# Patient Record
Sex: Male | Born: 1955 | Race: White | Hispanic: No | Marital: Married | Smoking: Former smoker
Health system: Southern US, Community
[De-identification: ages and names within clinical notes are randomized; demographics above are authoritative.]

## PROBLEM LIST (undated history)

## (undated) DIAGNOSIS — E119 Type 2 diabetes mellitus without complications: Secondary | ICD-10-CM

## (undated) DIAGNOSIS — I10 Essential (primary) hypertension: Secondary | ICD-10-CM

---

## 2019-12-02 LAB — COLOGUARD: COLOGUARD: NEGATIVE

## 2020-02-16 ENCOUNTER — Other Ambulatory Visit (HOSPITAL_COMMUNITY): Payer: Self-pay

## 2020-02-16 ENCOUNTER — Emergency Department (HOSPITAL_COMMUNITY): Payer: Medicare Other

## 2020-02-16 ENCOUNTER — Encounter (HOSPITAL_COMMUNITY)
Admission: EM | Disposition: A | Discharge status: Rehabilitation Facility | Payer: Self-pay | Source: Home / Self Care | Attending: Orthopedic Surgery

## 2020-02-16 ENCOUNTER — Encounter (HOSPITAL_COMMUNITY): Payer: Self-pay | Admitting: Anesthesiology

## 2020-02-16 ENCOUNTER — Other Ambulatory Visit: Payer: Self-pay

## 2020-02-16 ENCOUNTER — Inpatient Hospital Stay (HOSPITAL_COMMUNITY)
Admission: EM | Admit: 2020-02-16 | Discharge: 2020-02-26 | DRG: 958 | Disposition: A | Discharge status: Rehabilitation Facility | Payer: Medicare Other | Attending: Orthopedic Surgery | Admitting: Orthopedic Surgery

## 2020-02-16 ENCOUNTER — Inpatient Hospital Stay (HOSPITAL_COMMUNITY): Payer: Medicare Other | Admitting: Anesthesiology

## 2020-02-16 ENCOUNTER — Inpatient Hospital Stay (HOSPITAL_COMMUNITY): Payer: Medicare Other

## 2020-02-16 ENCOUNTER — Encounter (HOSPITAL_COMMUNITY): Payer: Self-pay | Admitting: Pharmacy Technician

## 2020-02-16 DIAGNOSIS — S270XXD Traumatic pneumothorax, subsequent encounter: Secondary | ICD-10-CM | POA: Diagnosis not present

## 2020-02-16 DIAGNOSIS — M79605 Pain in left leg: Secondary | ICD-10-CM | POA: Diagnosis not present

## 2020-02-16 DIAGNOSIS — E785 Hyperlipidemia, unspecified: Secondary | ICD-10-CM | POA: Diagnosis present

## 2020-02-16 DIAGNOSIS — D62 Acute posthemorrhagic anemia: Secondary | ICD-10-CM | POA: Diagnosis not present

## 2020-02-16 DIAGNOSIS — S82201B Unspecified fracture of shaft of right tibia, initial encounter for open fracture type I or II: Secondary | ICD-10-CM | POA: Diagnosis present

## 2020-02-16 DIAGNOSIS — Z79899 Other long term (current) drug therapy: Secondary | ICD-10-CM

## 2020-02-16 DIAGNOSIS — Z6836 Body mass index (BMI) 36.0-36.9, adult: Secondary | ICD-10-CM | POA: Diagnosis not present

## 2020-02-16 DIAGNOSIS — Z87891 Personal history of nicotine dependence: Secondary | ICD-10-CM

## 2020-02-16 DIAGNOSIS — Z9889 Other specified postprocedural states: Secondary | ICD-10-CM

## 2020-02-16 DIAGNOSIS — Z7982 Long term (current) use of aspirin: Secondary | ICD-10-CM | POA: Diagnosis not present

## 2020-02-16 DIAGNOSIS — W11XXXA Fall on and from ladder, initial encounter: Secondary | ICD-10-CM | POA: Diagnosis present

## 2020-02-16 DIAGNOSIS — N179 Acute kidney failure, unspecified: Secondary | ICD-10-CM | POA: Diagnosis not present

## 2020-02-16 DIAGNOSIS — K219 Gastro-esophageal reflux disease without esophagitis: Secondary | ICD-10-CM | POA: Diagnosis present

## 2020-02-16 DIAGNOSIS — T1490XA Injury, unspecified, initial encounter: Secondary | ICD-10-CM

## 2020-02-16 DIAGNOSIS — Z7984 Long term (current) use of oral hypoglycemic drugs: Secondary | ICD-10-CM

## 2020-02-16 DIAGNOSIS — S8392XA Sprain of unspecified site of left knee, initial encounter: Secondary | ICD-10-CM | POA: Diagnosis present

## 2020-02-16 DIAGNOSIS — J939 Pneumothorax, unspecified: Secondary | ICD-10-CM

## 2020-02-16 DIAGNOSIS — S82102A Unspecified fracture of upper end of left tibia, initial encounter for closed fracture: Secondary | ICD-10-CM | POA: Diagnosis not present

## 2020-02-16 DIAGNOSIS — S82142S Displaced bicondylar fracture of left tibia, sequela: Secondary | ICD-10-CM | POA: Diagnosis not present

## 2020-02-16 DIAGNOSIS — Z79891 Long term (current) use of opiate analgesic: Secondary | ICD-10-CM

## 2020-02-16 DIAGNOSIS — E872 Acidosis: Secondary | ICD-10-CM | POA: Diagnosis present

## 2020-02-16 DIAGNOSIS — Z791 Long term (current) use of non-steroidal anti-inflammatories (NSAID): Secondary | ICD-10-CM | POA: Diagnosis not present

## 2020-02-16 DIAGNOSIS — E119 Type 2 diabetes mellitus without complications: Secondary | ICD-10-CM | POA: Diagnosis present

## 2020-02-16 DIAGNOSIS — R52 Pain, unspecified: Secondary | ICD-10-CM

## 2020-02-16 DIAGNOSIS — E669 Obesity, unspecified: Secondary | ICD-10-CM | POA: Diagnosis present

## 2020-02-16 DIAGNOSIS — I10 Essential (primary) hypertension: Secondary | ICD-10-CM | POA: Diagnosis present

## 2020-02-16 DIAGNOSIS — T148XXA Other injury of unspecified body region, initial encounter: Secondary | ICD-10-CM

## 2020-02-16 DIAGNOSIS — S270XXA Traumatic pneumothorax, initial encounter: Secondary | ICD-10-CM | POA: Diagnosis present

## 2020-02-16 DIAGNOSIS — Z20822 Contact with and (suspected) exposure to covid-19: Secondary | ICD-10-CM | POA: Diagnosis present

## 2020-02-16 DIAGNOSIS — M25511 Pain in right shoulder: Secondary | ICD-10-CM | POA: Diagnosis present

## 2020-02-16 DIAGNOSIS — W11XXXD Fall on and from ladder, subsequent encounter: Secondary | ICD-10-CM | POA: Diagnosis present

## 2020-02-16 DIAGNOSIS — S82142A Displaced bicondylar fracture of left tibia, initial encounter for closed fracture: Principal | ICD-10-CM | POA: Diagnosis present

## 2020-02-16 DIAGNOSIS — S82142K Displaced bicondylar fracture of left tibia, subsequent encounter for closed fracture with nonunion: Secondary | ICD-10-CM | POA: Diagnosis not present

## 2020-02-16 DIAGNOSIS — S82202A Unspecified fracture of shaft of left tibia, initial encounter for closed fracture: Secondary | ICD-10-CM

## 2020-02-16 DIAGNOSIS — D72829 Elevated white blood cell count, unspecified: Secondary | ICD-10-CM | POA: Diagnosis present

## 2020-02-16 DIAGNOSIS — S2231XD Fracture of one rib, right side, subsequent encounter for fracture with routine healing: Secondary | ICD-10-CM | POA: Diagnosis not present

## 2020-02-16 DIAGNOSIS — S2241XA Multiple fractures of ribs, right side, initial encounter for closed fracture: Secondary | ICD-10-CM

## 2020-02-16 DIAGNOSIS — S2231XA Fracture of one rib, right side, initial encounter for closed fracture: Secondary | ICD-10-CM | POA: Diagnosis present

## 2020-02-16 DIAGNOSIS — E118 Type 2 diabetes mellitus with unspecified complications: Secondary | ICD-10-CM | POA: Diagnosis not present

## 2020-02-16 DIAGNOSIS — M25561 Pain in right knee: Secondary | ICD-10-CM | POA: Diagnosis present

## 2020-02-16 DIAGNOSIS — S82142D Displaced bicondylar fracture of left tibia, subsequent encounter for closed fracture with routine healing: Secondary | ICD-10-CM | POA: Diagnosis not present

## 2020-02-16 DIAGNOSIS — W11XXXS Fall on and from ladder, sequela: Secondary | ICD-10-CM | POA: Diagnosis not present

## 2020-02-16 DIAGNOSIS — Y93H9 Activity, other involving exterior property and land maintenance, building and construction: Secondary | ICD-10-CM

## 2020-02-16 DIAGNOSIS — Z9689 Presence of other specified functional implants: Secondary | ICD-10-CM

## 2020-02-16 DIAGNOSIS — Z419 Encounter for procedure for purposes other than remedying health state, unspecified: Secondary | ICD-10-CM

## 2020-02-16 DIAGNOSIS — J930 Spontaneous tension pneumothorax: Secondary | ICD-10-CM | POA: Diagnosis not present

## 2020-02-16 DIAGNOSIS — S82202D Unspecified fracture of shaft of left tibia, subsequent encounter for closed fracture with routine healing: Secondary | ICD-10-CM | POA: Diagnosis not present

## 2020-02-16 DIAGNOSIS — Z794 Long term (current) use of insulin: Secondary | ICD-10-CM | POA: Diagnosis not present

## 2020-02-16 DIAGNOSIS — S82402D Unspecified fracture of shaft of left fibula, subsequent encounter for closed fracture with routine healing: Secondary | ICD-10-CM | POA: Diagnosis present

## 2020-02-16 HISTORY — DX: Type 2 diabetes mellitus without complications: E11.9

## 2020-02-16 HISTORY — PX: I & D EXTREMITY: SHX5045

## 2020-02-16 HISTORY — PX: ARTHROCENTESIS INJECTION: SHX6830

## 2020-02-16 HISTORY — DX: Essential (primary) hypertension: I10

## 2020-02-16 HISTORY — PX: CLOSED REDUCTION TIBIA: SHX5115

## 2020-02-16 HISTORY — PX: EXTERNAL FIXATION LEG: SHX1549

## 2020-02-16 LAB — COMPREHENSIVE METABOLIC PANEL
ALT: 21 U/L (ref 0–44)
AST: 37 U/L (ref 15–41)
Albumin: 4.2 g/dL (ref 3.5–5.0)
Alkaline Phosphatase: 56 U/L (ref 38–126)
Anion gap: 15 (ref 5–15)
BUN: 18 mg/dL (ref 8–23)
CO2: 18 mmol/L — ABNORMAL LOW (ref 22–32)
Calcium: 9.3 mg/dL (ref 8.9–10.3)
Chloride: 104 mmol/L (ref 98–111)
Creatinine, Ser: 1.07 mg/dL (ref 0.61–1.24)
GFR calc Af Amer: >60 mL/min (ref >60)
GFR calc non Af Amer: >60 mL/min (ref >60)
Glucose, Bld: 155 mg/dL — ABNORMAL HIGH (ref 70–99)
Potassium: 5.1 mmol/L (ref 3.5–5.1)
Sodium: 137 mmol/L (ref 135–145)
Total Bilirubin: 1.2 mg/dL (ref 0.3–1.2)
Total Protein: 6.7 g/dL (ref 6.5–8.1)

## 2020-02-16 LAB — SURGICAL PCR SCREEN
MRSA, PCR: NEGATIVE
Staphylococcus aureus: NEGATIVE

## 2020-02-16 LAB — I-STAT CHEM 8, ED
BUN: 22 mg/dL (ref 8–23)
Calcium, Ion: 1 mmol/L — ABNORMAL LOW (ref 1.15–1.40)
Chloride: 105 mmol/L (ref 98–111)
Creatinine, Ser: 0.9 mg/dL (ref 0.61–1.24)
Glucose, Bld: 152 mg/dL — ABNORMAL HIGH (ref 70–99)
HCT: 42 % (ref 39.0–52.0)
Hemoglobin: 14.3 g/dL (ref 13.0–17.0)
Potassium: 4.8 mmol/L (ref 3.5–5.1)
Sodium: 137 mmol/L (ref 135–145)
TCO2: 24 mmol/L (ref 22–32)

## 2020-02-16 LAB — CBC
HCT: 44.2 % (ref 39.0–52.0)
Hemoglobin: 14.8 g/dL (ref 13.0–17.0)
MCH: 30.3 pg (ref 26.0–34.0)
MCHC: 33.5 g/dL (ref 30.0–36.0)
MCV: 90.4 fL (ref 80.0–100.0)
Platelets: 177 10*3/uL (ref 150–400)
RBC: 4.89 MIL/uL (ref 4.22–5.81)
RDW: 12.8 % (ref 11.5–15.5)
WBC: 10.6 10*3/uL — ABNORMAL HIGH (ref 4.0–10.5)
nRBC: 0 % (ref 0.0–0.2)

## 2020-02-16 LAB — GLUCOSE, CAPILLARY
Glucose-Capillary: 212 mg/dL — ABNORMAL HIGH (ref 70–99)
Glucose-Capillary: 205 mg/dL — ABNORMAL HIGH (ref 70–99)
Glucose-Capillary: 229 mg/dL — ABNORMAL HIGH (ref 70–99)

## 2020-02-16 LAB — HIV ANTIBODY (ROUTINE TESTING W REFLEX): HIV Screen 4th Generation wRfx: NONREACTIVE

## 2020-02-16 LAB — LACTIC ACID, PLASMA
Lactic Acid, Venous: 3.5 mmol/L (ref 0.5–1.9)
Lactic Acid, Venous: 2.3 mmol/L (ref 0.5–1.9)
Lactic Acid, Venous: 2.8 mmol/L (ref 0.5–1.9)

## 2020-02-16 LAB — RESPIRATORY PANEL BY RT PCR (FLU A&B, COVID)
Influenza A by PCR: NEGATIVE
Influenza B by PCR: NEGATIVE
SARS Coronavirus 2 by RT PCR: NEGATIVE

## 2020-02-16 LAB — SAMPLE TO BLOOD BANK

## 2020-02-16 LAB — PROTIME-INR
INR: 1.1 (ref 0.8–1.2)
Prothrombin Time: 13.7 seconds (ref 11.4–15.2)

## 2020-02-16 LAB — ETHANOL: Alcohol, Ethyl (B): <10 mg/dL (ref <10)

## 2020-02-16 SURGERY — IRRIGATION AND DEBRIDEMENT EXTREMITY
Anesthesia: General | Laterality: Left

## 2020-02-16 MED ORDER — ONDANSETRON HCL 4 MG/2ML IJ SOLN
INTRAMUSCULAR | Status: AC
  Filled 2020-02-16: qty 2

## 2020-02-16 MED ORDER — FENTANYL CITRATE (PF) 100 MCG/2ML IJ SOLN: 25.0000 ug | INTRAMUSCULAR | Status: DC | PRN

## 2020-02-16 MED ORDER — PROPOFOL 10 MG/ML IV BOLUS
INTRAVENOUS | Status: AC
  Filled 2020-02-16: qty 20

## 2020-02-16 MED ORDER — OXYCODONE HCL 5 MG PO TABS: 5.0000 mg | ORAL_TABLET | ORAL | Status: DC | PRN

## 2020-02-16 MED ORDER — METHOCARBAMOL 500 MG PO TABS: 500.0000 mg | ORAL_TABLET | Freq: Four times a day (QID) | ORAL | Status: DC | PRN

## 2020-02-16 MED ORDER — ASPIRIN 81 MG PO CHEW
81.0000 mg | CHEWABLE_TABLET | Freq: Every day | ORAL | Status: DC
  Administered 2020-02-17 – 2020-02-26 (×9): 81 mg via ORAL
  Filled 2020-02-16 (×9): qty 1

## 2020-02-16 MED ORDER — ROSUVASTATIN CALCIUM 5 MG PO TABS
10.0000 mg | ORAL_TABLET | Freq: Every day | ORAL | Status: DC
  Administered 2020-02-17 – 2020-02-25 (×9): 10 mg via ORAL
  Filled 2020-02-16 (×9): qty 2

## 2020-02-16 MED ORDER — ENOXAPARIN SODIUM 40 MG/0.4ML ~~LOC~~ SOLN: 40.0000 mg | Freq: Two times a day (BID) | SUBCUTANEOUS | Status: DC

## 2020-02-16 MED ORDER — MIDAZOLAM HCL 2 MG/2ML IJ SOLN
INTRAMUSCULAR | Status: DC | PRN
  Administered 2020-02-16: 2 mg via INTRAVENOUS

## 2020-02-16 MED ORDER — MIDAZOLAM HCL 2 MG/2ML IJ SOLN
INTRAMUSCULAR | Status: AC
  Filled 2020-02-16: qty 2

## 2020-02-16 MED ORDER — METHOCARBAMOL 1000 MG/10ML IJ SOLN
500.0000 mg | Freq: Four times a day (QID) | INTRAVENOUS | Status: DC | PRN
  Filled 2020-02-16: qty 5

## 2020-02-16 MED ORDER — FENTANYL CITRATE (PF) 250 MCG/5ML IJ SOLN
INTRAMUSCULAR | Status: AC
  Filled 2020-02-16: qty 5

## 2020-02-16 MED ORDER — POVIDONE-IODINE 10 % EX SWAB: 2.0000 "application " | Freq: Once | CUTANEOUS | Status: DC

## 2020-02-16 MED ORDER — ACETAMINOPHEN 10 MG/ML IV SOLN: 1000.0000 mg | Freq: Once | INTRAVENOUS | Status: DC | PRN

## 2020-02-16 MED ORDER — DEXTROSE 5 % IV SOLN
3.0000 g | INTRAVENOUS | Status: AC
  Administered 2020-02-16: 3 g via INTRAVENOUS
  Filled 2020-02-16: qty 3000

## 2020-02-16 MED ORDER — LACTATED RINGERS IV SOLN: INTRAVENOUS | Status: DC

## 2020-02-16 MED ORDER — ONDANSETRON HCL 4 MG/2ML IJ SOLN: 4.0000 mg | Freq: Four times a day (QID) | INTRAMUSCULAR | Status: DC | PRN

## 2020-02-16 MED ORDER — ONDANSETRON HCL 4 MG/2ML IJ SOLN
INTRAMUSCULAR | Status: DC | PRN
  Administered 2020-02-16: 4 mg via INTRAVENOUS

## 2020-02-16 MED ORDER — LIDOCAINE HCL (CARDIAC) PF 100 MG/5ML IV SOSY
PREFILLED_SYRINGE | INTRAVENOUS | Status: DC | PRN
  Administered 2020-02-16: 60 mg via INTRATRACHEAL

## 2020-02-16 MED ORDER — PHENYLEPHRINE HCL (PRESSORS) 10 MG/ML IV SOLN
INTRAVENOUS | Status: DC | PRN
  Administered 2020-02-16 (×2): 40 ug via INTRAVENOUS
  Administered 2020-02-16: 80 ug via INTRAVENOUS

## 2020-02-16 MED ORDER — DEXTROSE 5 % IV SOLN
3.0000 g | INTRAVENOUS | Status: DC
  Filled 2020-02-16: qty 3000

## 2020-02-16 MED ORDER — SUCCINYLCHOLINE CHLORIDE 20 MG/ML IJ SOLN
INTRAMUSCULAR | Status: DC | PRN
  Administered 2020-02-16: 120 mg via INTRAVENOUS

## 2020-02-16 MED ORDER — SUGAMMADEX SODIUM 500 MG/5ML IV SOLN
INTRAVENOUS | Status: DC | PRN
  Administered 2020-02-16: 500 mg via INTRAVENOUS

## 2020-02-16 MED ORDER — VASOPRESSIN 20 UNIT/ML IV SOLN
INTRAVENOUS | Status: AC
  Filled 2020-02-16: qty 1

## 2020-02-16 MED ORDER — SUGAMMADEX SODIUM 500 MG/5ML IV SOLN
INTRAVENOUS | Status: AC
  Filled 2020-02-16: qty 5

## 2020-02-16 MED ORDER — HYDROMORPHONE HCL 1 MG/ML IJ SOLN
1.0000 mg | INTRAMUSCULAR | Status: DC | PRN
  Administered 2020-02-16 (×2): 1 mg via INTRAVENOUS
  Filled 2020-02-16 (×2): qty 1

## 2020-02-16 MED ORDER — GABAPENTIN 300 MG PO CAPS
300.0000 mg | ORAL_CAPSULE | Freq: Every day | ORAL | Status: DC
  Administered 2020-02-17 – 2020-02-25 (×9): 300 mg via ORAL
  Filled 2020-02-16 (×9): qty 1

## 2020-02-16 MED ORDER — INSULIN ASPART 100 UNIT/ML ~~LOC~~ SOLN
0.0000 [IU] | Freq: Three times a day (TID) | SUBCUTANEOUS | Status: DC
  Administered 2020-02-16: 3 [IU] via SUBCUTANEOUS
  Administered 2020-02-17 (×3): 2 [IU] via SUBCUTANEOUS
  Administered 2020-02-18: 3 [IU] via SUBCUTANEOUS
  Administered 2020-02-18 – 2020-02-20 (×6): 2 [IU] via SUBCUTANEOUS
  Administered 2020-02-20: 3 [IU] via SUBCUTANEOUS
  Administered 2020-02-20: 2 [IU] via SUBCUTANEOUS
  Administered 2020-02-21: 3 [IU] via SUBCUTANEOUS
  Administered 2020-02-21 (×2): 2 [IU] via SUBCUTANEOUS
  Administered 2020-02-22: 3 [IU] via SUBCUTANEOUS
  Administered 2020-02-22 – 2020-02-23 (×3): 2 [IU] via SUBCUTANEOUS
  Administered 2020-02-24: 3 [IU] via SUBCUTANEOUS
  Administered 2020-02-24: 2 [IU] via SUBCUTANEOUS

## 2020-02-16 MED ORDER — FENTANYL CITRATE (PF) 250 MCG/5ML IJ SOLN
INTRAMUSCULAR | Status: DC | PRN
  Administered 2020-02-16 (×7): 50 ug via INTRAVENOUS

## 2020-02-16 MED ORDER — ROCURONIUM 10MG/ML (10ML) SYRINGE FOR MEDFUSION PUMP - OPTIME
INTRAVENOUS | Status: DC | PRN
  Administered 2020-02-16: 50 mg via INTRAVENOUS
  Administered 2020-02-16: 20 mg via INTRAVENOUS

## 2020-02-16 MED ORDER — ONDANSETRON HCL 4 MG PO TABS: 4.0000 mg | ORAL_TABLET | Freq: Four times a day (QID) | ORAL | Status: DC | PRN

## 2020-02-16 MED ORDER — MORPHINE SULFATE (PF) 2 MG/ML IV SOLN: 2.0000 mg | INTRAVENOUS | Status: DC | PRN

## 2020-02-16 MED ORDER — ONDANSETRON HCL 4 MG/2ML IJ SOLN
4.0000 mg | Freq: Once | INTRAMUSCULAR | Status: AC | PRN
  Administered 2020-02-16: 4 mg via INTRAVENOUS

## 2020-02-16 MED ORDER — SODIUM CHLORIDE 0.9 % IV SOLN: Freq: Once | INTRAVENOUS | Status: AC

## 2020-02-16 MED ORDER — FENTANYL CITRATE (PF) 100 MCG/2ML IJ SOLN
100.0000 ug | Freq: Once | INTRAMUSCULAR | Status: AC
  Administered 2020-02-16: 100 ug via INTRAVENOUS

## 2020-02-16 MED ORDER — CHLORHEXIDINE GLUCONATE 4 % EX LIQD
60.0000 mL | Freq: Once | CUTANEOUS | Status: AC
  Administered 2020-02-16: 4 via TOPICAL

## 2020-02-16 MED ORDER — VASOPRESSIN 20 UNIT/ML IV SOLN
INTRAVENOUS | Status: DC | PRN
  Administered 2020-02-16: 1 [IU] via INTRAVENOUS
  Administered 2020-02-16: 3 [IU] via INTRAVENOUS
  Administered 2020-02-16 (×3): 2 [IU] via INTRAVENOUS

## 2020-02-16 MED ORDER — LISINOPRIL 20 MG PO TABS: 20.0000 mg | ORAL_TABLET | Freq: Every day | ORAL | Status: DC

## 2020-02-16 MED ORDER — KETOROLAC TROMETHAMINE 15 MG/ML IJ SOLN: 15.0000 mg | Freq: Once | INTRAMUSCULAR | Status: AC | PRN

## 2020-02-16 MED ORDER — 0.9 % SODIUM CHLORIDE (POUR BTL) OPTIME
TOPICAL | Status: DC | PRN
  Administered 2020-02-16: 1000 mL

## 2020-02-16 MED ORDER — PANTOPRAZOLE SODIUM 20 MG PO TBEC
20.0000 mg | DELAYED_RELEASE_TABLET | Freq: Every day | ORAL | Status: DC
  Administered 2020-02-17 – 2020-02-26 (×11): 20 mg via ORAL
  Filled 2020-02-16 (×10): qty 1

## 2020-02-16 MED ORDER — PROPOFOL 10 MG/ML IV BOLUS
INTRAVENOUS | Status: DC | PRN
  Administered 2020-02-16: 150 mg via INTRAVENOUS

## 2020-02-16 SURGICAL SUPPLY — 69 items
BANDAGE ESMARK 6X9 LF (GAUZE/BANDAGES/DRESSINGS) ×2 IMPLANT
BAR GLASS FIBER EXFX 11X600 (EXFIX) ×2 IMPLANT
BNDG COHESIVE 4X5 TAN STRL (GAUZE/BANDAGES/DRESSINGS) ×3 IMPLANT
BNDG COHESIVE 6X5 TAN STRL LF (GAUZE/BANDAGES/DRESSINGS) IMPLANT
BNDG ELASTIC 4X5.8 VLCR STR LF (GAUZE/BANDAGES/DRESSINGS) ×4 IMPLANT
BNDG ELASTIC 6X15 VLCR STRL LF (GAUZE/BANDAGES/DRESSINGS) ×2 IMPLANT
BNDG ELASTIC 6X5.8 VLCR STR LF (GAUZE/BANDAGES/DRESSINGS) ×3 IMPLANT
BNDG ESMARK 6X9 LF (GAUZE/BANDAGES/DRESSINGS) ×3
BNDG GAUZE ELAST 4 BULKY (GAUZE/BANDAGES/DRESSINGS) ×6 IMPLANT
BRUSH SCRUB EZ PLAIN DRY (MISCELLANEOUS) ×6 IMPLANT
CANISTER WOUNDNEG PRESSURE 500 (CANNISTER) ×2 IMPLANT
COVER SURGICAL LIGHT HANDLE (MISCELLANEOUS) ×6 IMPLANT
COVER WAND RF STERILE (DRAPES) ×2 IMPLANT
CUFF TOURN SGL QUICK 18X4 (TOURNIQUET CUFF) IMPLANT
DRAPE C-ARM 42X72 X-RAY (DRAPES) IMPLANT
DRAPE C-ARMOR (DRAPES) ×3 IMPLANT
DRAPE U-SHAPE 47X51 STRL (DRAPES) ×3 IMPLANT
DRESSING PREVENA PLUS CUSTOM (GAUZE/BANDAGES/DRESSINGS) IMPLANT
DRSG ADAPTIC 3X8 NADH LF (GAUZE/BANDAGES/DRESSINGS) ×3 IMPLANT
DRSG MEPITEL 4X7.2 (GAUZE/BANDAGES/DRESSINGS) ×1 IMPLANT
DRSG PREVENA PLUS CUSTOM (GAUZE/BANDAGES/DRESSINGS) ×3
ELECT REM PT RETURN 9FT ADLT (ELECTROSURGICAL) ×3
ELECTRODE REM PT RTRN 9FT ADLT (ELECTROSURGICAL) ×2 IMPLANT
EVACUATOR 1/8 PVC DRAIN (DRAIN) IMPLANT
GAUZE SPONGE 4X4 12PLY STRL (GAUZE/BANDAGES/DRESSINGS) ×3 IMPLANT
GAUZE SPONGE 4X4 12PLY STRL LF (GAUZE/BANDAGES/DRESSINGS) ×2 IMPLANT
GLOVE BIO SURGEON STRL SZ7.5 (GLOVE) ×3 IMPLANT
GLOVE BIO SURGEON STRL SZ8 (GLOVE) ×3 IMPLANT
GLOVE BIOGEL PI IND STRL 7.5 (GLOVE) ×2 IMPLANT
GLOVE BIOGEL PI IND STRL 8 (GLOVE) ×2 IMPLANT
GLOVE BIOGEL PI IND STRL 9 (GLOVE) ×2 IMPLANT
GLOVE BIOGEL PI INDICATOR 7.5 (GLOVE) ×1
GLOVE BIOGEL PI INDICATOR 8 (GLOVE) ×1
GLOVE BIOGEL PI INDICATOR 9 (GLOVE) ×1
GOWN STRL REUS W/ TWL LRG LVL3 (GOWN DISPOSABLE) ×4 IMPLANT
GOWN STRL REUS W/ TWL XL LVL3 (GOWN DISPOSABLE) ×2 IMPLANT
GOWN STRL REUS W/TWL LRG LVL3 (GOWN DISPOSABLE) ×2
GOWN STRL REUS W/TWL XL LVL3 (GOWN DISPOSABLE) ×1
HALF PIN 5.0X160 (EXFIX) ×4 IMPLANT
HANDPIECE INTERPULSE COAX TIP (DISPOSABLE) ×1
KIT BASIN OR (CUSTOM PROCEDURE TRAY) ×3 IMPLANT
KIT TURNOVER KIT B (KITS) ×3 IMPLANT
MANIFOLD NEPTUNE II (INSTRUMENTS) ×3 IMPLANT
NEEDLE 22X1 1/2 (OR ONLY) (NEEDLE) IMPLANT
NS IRRIG 1000ML POUR BTL (IV SOLUTION) ×3 IMPLANT
PACK ORTHO EXTREMITY (CUSTOM PROCEDURE TRAY) ×3 IMPLANT
PAD ARMBOARD 7.5X6 YLW CONV (MISCELLANEOUS) ×6 IMPLANT
PAD CAST 4YDX4 CTTN HI CHSV (CAST SUPPLIES) IMPLANT
PAD NEG PRESSURE SENSATRAC (MISCELLANEOUS) ×1 IMPLANT
PADDING CAST COTTON 4X4 STRL (CAST SUPPLIES) ×2
PADDING CAST COTTON 6X4 STRL (CAST SUPPLIES) ×6 IMPLANT
PIN CLAMP 2BAR 75MM BLUE (EXFIX) ×2 IMPLANT
SET HNDPC FAN SPRY TIP SCT (DISPOSABLE) IMPLANT
SOL PREP POV-IOD 4OZ 10% (MISCELLANEOUS) ×4 IMPLANT
SOL PREP PROV IODINE SCRUB 4OZ (MISCELLANEOUS) ×3 IMPLANT
SPONGE LAP 18X18 RF (DISPOSABLE) ×3 IMPLANT
STAPLER VISISTAT 35W (STAPLE) IMPLANT
STOCKINETTE IMPERVIOUS 9X36 MD (GAUZE/BANDAGES/DRESSINGS) IMPLANT
STOCKINETTE IMPERVIOUS LG (DRAPES) IMPLANT
STRIP CLOSURE SKIN 1/2X4 (GAUZE/BANDAGES/DRESSINGS) IMPLANT
SUT ETHILON 2 0 FS 18 (SUTURE) ×3 IMPLANT
SUT PDS AB 2-0 CT1 27 (SUTURE) IMPLANT
SUT PDS AB 2-0 CT2 27 (SUTURE) ×2 IMPLANT
TOWEL GREEN STERILE (TOWEL DISPOSABLE) ×6 IMPLANT
TOWEL GREEN STERILE FF (TOWEL DISPOSABLE) ×6 IMPLANT
TUBE CONNECTING 12X1/4 (SUCTIONS) ×3 IMPLANT
UNDERPAD 30X30 (UNDERPADS AND DIAPERS) ×3 IMPLANT
WATER STERILE IRR 1000ML POUR (IV SOLUTION) ×3 IMPLANT
YANKAUER SUCT BULB TIP NO VENT (SUCTIONS) ×3 IMPLANT

## 2020-02-16 NOTE — Consult Note (Signed)
Reason for Consult:Leg deformity Referring Physician: R Ether Wolters is an 64 y.o. male.  HPI: Clair Gulling was working on a ladder and fell about 72ft. He his an [facility] unit on the way down. He had immediate lower extremity pain upon landing. He was brought in as a level 2 trauma activation. He c/o right shoulder and bilateral leg pain.  Past Medical History:  Diagnosis Date  . Diabetes mellitus without complication ([place])   . Hypertension     History reviewed. No pertinent surgical history.  No family history on file.  Social History:  has no history on file for tobacco, alcohol, and drug.  Allergies: No Known Allergies  Medications: I have reviewed the patient's current medications.  Results for orders placed or performed during the hospital encounter of 02/16/20 (from the past 48 hour(s))  I-Stat Chem 8, ED     Status: Abnormal   Collection Time: 02/16/20 10:38 AM  Result Value Ref Range   Sodium 137 135 - 145 mmol/L   Potassium 4.8 3.5 - 5.1 mmol/L   Chloride 105 98 - 111 mmol/L   BUN 22 8 - 23 mg/dL   Creatinine, Ser 0.90 0.61 - 1.24 mg/dL   Glucose, Bld 152 (H) 70 - 99 mg/dL    Comment: Glucose reference range applies only to samples taken after fasting for at least 8 hours.   Calcium, Ion 1.00 (L) 1.15 - 1.40 mmol/L   TCO2 24 22 - 32 mmol/L   Hemoglobin 14.3 13.0 - 17.0 g/dL   HCT 42.0 39.0 - 52.0 %    No results found.  Review of Systems  HENT: Negative for ear discharge, ear pain, hearing loss and tinnitus.   Eyes: Negative for photophobia and pain.  Respiratory: Negative for cough and shortness of breath.   Cardiovascular: Positive for chest pain (Right upper).  Gastrointestinal: Negative for abdominal pain, nausea and vomiting.  Genitourinary: Negative for dysuria, flank pain, frequency and urgency.  Musculoskeletal: Positive for arthralgias (Left knee). Negative for back pain, myalgias and neck pain.  Neurological: Negative for dizziness and headaches.   Hematological: Does not bruise/bleed easily.  Psychiatric/Behavioral: The patient is not nervous/anxious.    Blood pressure (!) 150/100, temperature (!) 97.4 F (36.3 C), temperature source Temporal, resp. rate 18, height 5\' 11"  (1.803 m), weight 120.2 kg, SpO2 97 %. Physical Exam  Constitutional: He appears well-developed and well-nourished. No distress.  HENT:  Head: Normocephalic and atraumatic.  Eyes: Conjunctivae are normal. Right eye exhibits no discharge. Left eye exhibits no discharge. No scleral icterus.  Cardiovascular: Normal rate and regular rhythm.  Respiratory: Effort normal. No respiratory distress.  Musculoskeletal:     Cervical back: Normal range of motion.     Comments: RLE Anterior shin lac, no ecchymosis or rash  Mild TTP lower leg  No knee or ankle effusion  Knee stable to varus/ valgus and anterior/posterior stress  Sens DPN, SPN, TN intact  Motor EHL, ext, flex, evers 5/5  DP 2+, No significant edema  LLE Anterior shin lac, no ecchymosis or rash  Mod knee TTP, large hematoma medial knee  No ankle effusion  Sens DPN, SPN, TN intact  Motor EHL, ext, flex, evers 5/5  DP 2+, No significant edema  Neurological: He is alert.  Skin: Skin is warm and dry. He is not diaphoretic.  Psychiatric: He has a normal mood and affect. His behavior is normal.    Assessment/Plan: Left tibia fx -- Will plan on ORIF in AM  with Dr. Carola Frost. Please keep NPO after MN. Multiple medical problems including DM, HTN, GERD, and HLD -- Pt has been very hypertensive here but may be stress/pain. Have asked medicine to admit or at least consult. Appreciate their help.    Freeman Caldron, PA-C Orthopedic Surgery [phone] 02/16/2020, 10:48 AM

## 2020-02-16 NOTE — Anesthesia Procedure Notes (Signed)
Procedure Name: Intubation Date/Time: 02/16/2020 9:07 PM Performed by: Molli Hazard, CRNA Pre-anesthesia Checklist: Patient identified, Emergency Drugs available, Suction available and Patient being monitored Patient Re-evaluated:Patient Re-evaluated prior to induction Oxygen Delivery Method: Circle system utilized Preoxygenation: Pre-oxygenation with 100% oxygen Induction Type: IV induction and Rapid sequence Laryngoscope Size: Miller and 2 Grade View: Grade I Tube type: Oral Tube size: 7.5 mm Number of attempts: 1 Airway Equipment and Method: Stylet Placement Confirmation: ETT inserted through vocal cords under direct vision,  positive ETCO2 and breath sounds checked- equal and bilateral Secured at: 23 cm Tube secured with: Tape Dental Injury: Teeth and Oropharynx as per pre-operative assessment

## 2020-02-16 NOTE — Progress Notes (Signed)
Orthopedic Tech Progress Note Patient Details:  Cameron Velasquez [birth year] [id number] Had help from RN to hold leg while I applied bucky dressing and KNEE IMMOBILIZER  Ortho Devices Type of Ortho Device: Ace wrap, Knee Immobilizer Ortho Device/Splint Location: LLE Ortho Device/Splint Interventions: Ordered, Application   Post Interventions Patient Tolerated: Well Instructions Provided: Care of device   [name] 02/16/2020, 3:36 PM

## 2020-02-16 NOTE — Transfer of Care (Signed)
Immediate Anesthesia Transfer of Care Note  Patient: Wilmon Pali  Procedure(s) Performed: IRRIGATION AND DEBRIDEMENT OPEN FRACTURE EXTREMITY (Bilateral ) EXTERNAL FIXATION LEG (Left ) Arthrocentesis Injection (Left ) Closed Reduction Tibia (Left )  Patient Location: PACU  Anesthesia Type:General  Level of Consciousness: sedated  Airway & Oxygen Therapy: Patient connected to face mask oxygen  Post-op Assessment: Report given to RN and Post -op Vital signs reviewed and stable  Post vital signs: Reviewed and stable  Last Vitals:  Vitals Value Taken Time  BP 168/82 02/16/20 2315  Temp    Pulse 95 02/16/20 2315  Resp 31 02/16/20 2315  SpO2 94 % 02/16/20 2315  Vitals shown include unvalidated device data.  Last Pain:  Vitals:   02/16/20 1548  TempSrc:   PainSc: 8       Patients Stated Pain Goal: 2 (02/16/20 1548)  Complications: No apparent anesthesia complications

## 2020-02-16 NOTE — Consult Note (Signed)
[facility] Medical Consultation  Cameron Velasquez FEO:[id number] DOB: 03-Mar-1956 DOA: 02/16/2020 PCP: No primary care provider on file.   Requesting physician: Dale Peru, PA-C Date of consultation: 02/16/2020 Reason for consultation: Medical management  Impression/Recommendations Active Problems:   Left tibial fracture   Leukocytosis   Essential hypertension   Diabetes mellitus type 2, controlled (HCC)  1.  Left tibial fracture secondary to fall from ladder: Acute. -Per orthopedics   2.  Leukocytosis, lactic acidosis: Acute.  WBC elevated at 10.6 with lactic acid 3.4. -Recheck lactic acid level  3.  Diabetes mellitus type 2: On admission patient blood glucose 155.  Last hemoglobin A1c noted to be 5.5 on 12/24/2019 patient's home medications include Trulicity 1.5 mg weekly, Amaryl 4 mg daily, and Metformin 1000 mg twice daily. -Hypoglycemic protocols -Hold Amaryl, Metformin, and Trulicity -Continue gabapentin -CBGs before every meal with sensitive SSI -Restart Amaryl, Metformin, and Trulicity at discharge  4.  Essential hypertension: Blood pressures currently maintained. Home medications include lisinopril 20 mg nightly.  -Continue lisinopril  5. Hyperlipidemia: Home medications include Crestor 10 mg daily -Continue Crestor  6.  GERD -Continue Protonix    I will followup again tomorrow. Please contact me if I can be of assistance in the meanwhile. Thank you for this consultation.  Chief Complaint: Left leg pain  HPI:  Cameron Velasquez is a 64 y.o. male with medical history significant of hypertension, hyperlipidemia, diabetes mellitus type 2, and GERD presents after having slipping and falling approximately 15 feet off a ladder.  He hit his left leg against the [facility] unit and hurt his right shoulder.  Patient complained of severe pain in the left leg and was unable to ambulate.  When he fell he did not lose consciousness or hit his head.  He reports needing to get home  to care for his wife who is 100% dependent on him.  Denies having any headache, shortness of breath, chest pain, diarrhea.  X-ray imaging revealed a proximal tibial fracture on the left leg.  Orthopedics was notified and [facility] was called to consult for medical management.   Review of Systems  Constitutional: Negative for fever.  Respiratory: Negative for shortness of breath.   Cardiovascular: Negative for chest pain.  Gastrointestinal: Negative for nausea and vomiting.  Musculoskeletal: Positive for falls, joint pain and myalgias.  Neurological: Negative for loss of consciousness.      Past Medical History:  Diagnosis Date  . Diabetes mellitus without complication (HCC)   . Hypertension    History reviewed. No pertinent surgical history. Social History:  has no history on file for tobacco, alcohol, and drug.  No Known Allergies No family history on file.  Prior to Admission medications   Medication Sig Start Date End Date Taking? Authorizing Provider  aspirin 81 MG chewable tablet Chew 81 mg by mouth daily. 01/05/20 12/30/20 Yes [provider]  b complex vitamins capsule Take 1 capsule by mouth daily.   Yes [provider]  Dulaglutide (TRULICITY) 1.5 MG/0.5ML SOPN Inject 1.5 mg into the skin every Sunday. 01/05/20 01/04/21 Yes [provider]  ergocalciferol (VITAMIN D2) 1.25 MG (50000 UT) capsule Take 50,000 Units by mouth every Monday. 01/05/20  Yes [provider]  gabapentin (NEURONTIN) 300 MG capsule Take 300 mg by mouth at bedtime. 01/05/20 07/03/20 Yes [provider]  glimepiride (AMARYL) 2 MG tablet Take 4 mg by mouth daily. 01/05/20 12/30/20 Yes [provider]  lisinopril (ZESTRIL) 20 MG tablet Take 20 mg by mouth  at bedtime. 01/05/20 07/03/20 Yes [provider]  meloxicam (MOBIC) 15 MG tablet Take 15 mg by mouth. 01/05/20 07/03/20 Yes [provider]  metFORMIN (GLUCOPHAGE-XR) 500 MG 24 hr tablet Take 1,000 mg by mouth in  the morning and at bedtime. 01/12/20  Yes [provider]  pantoprazole (PROTONIX) 20 MG tablet Take 20 mg by mouth daily. 01/05/20 07/03/20 Yes [provider]  rosuvastatin (CRESTOR) 10 MG tablet Take 10 mg by mouth at bedtime. 01/05/20 12/30/20 Yes [provider]  traMADol (ULTRAM) 50 MG tablet Take 50 mg by mouth at bedtime. for pain 12/26/19  Yes [provider]   Physical Exam:  Constitutional: Obese male who appears to be uncomfortable with any kind of movement. Vitals:   02/16/20 1029 02/16/20 1030 02/16/20 1032 02/16/20 1045  BP:      Pulse:    73  Resp:   18 13  Temp:   (!) 97.4 F (36.3 C)   TempSrc:   Temporal   SpO2:  97% 97% 99%  Weight: 120.2 kg     Height: 5\' 11"  (1.803 m)      Eyes: PERRL, lids and conjunctivae normal ENMT: Mucous membranes are moist. Posterior pharynx clear of any exudate or lesions.   Neck: normal, supple, no masses, no thyromegaly Respiratory: clear to auscultation bilaterally, no wheezing, no crackles. Normal respiratory effort. No accessory muscle use.  Cardiovascular: Regular rate and rhythm, no murmurs / rubs / gallops. No extremity edema. 2+ pedal pulses. No carotid bruits.  Abdomen: no tenderness, no masses palpated. No hepatosplenomegaly. Bowel sounds positive.  Musculoskeletal: no clubbing / cyanosis.  Deformity noted of the left leg Skin: Lacerations noted to the bilateral shins.   Hematoma of the left knee appreciated. Neurologic: CN 2-12 grossly intact. Sensation intact, DTR normal. Strength 5/5 in all 4.  Psychiatric: Normal judgment and insight. Alert and oriented x 3. Normal mood.   Labs on Admission:  Basic Metabolic Panel: Recent Labs  Lab 02/16/20 1024 02/16/20 1038  NA 137 137  K 5.1 4.8  CL 104 105  CO2 18*  --   GLUCOSE 155* 152*  BUN 18 22  CREATININE 1.07 0.90  CALCIUM 9.3  --    Liver Function Tests: Recent Labs  Lab 02/16/20 1024  AST 37  ALT 21  ALKPHOS 56  BILITOT 1.2  PROT  6.7  ALBUMIN 4.2   No results for input(s): LIPASE, AMYLASE in the last 168 hours. No results for input(s): AMMONIA in the last 168 hours. CBC: Recent Labs  Lab 02/16/20 1024 02/16/20 1038  WBC 10.6*  --   HGB 14.8 14.3  HCT 44.2 42.0  MCV 90.4  --   PLT 177  --    Cardiac Enzymes: No results for input(s): CKTOTAL, CKMB, CKMBINDEX, TROPONINI in the last 168 hours. BNP: Invalid input(s): POCBNP CBG: No results for input(s): GLUCAP in the last 168 hours.  Radiological Exams on Admission: CT Head Wo Contrast  Result Date: 02/16/2020 CLINICAL DATA:  Trauma EXAM: CT HEAD WITHOUT CONTRAST TECHNIQUE: Contiguous axial images were obtained from the base of the skull through the vertex without intravenous contrast. COMPARISON:  None. FINDINGS: Brain: There is no acute intracranial hemorrhage, mass effect, or edema. There is a small subacute or chronic infarct of the right occipital lobe. Gray-white differentiation is otherwise preserved. There is no extra-axial fluid collection. Ventricles and sulci are within normal limits in size and configuration. Patchy hypoattenuation in the supratentorial white matter is nonspecific  but may reflect mild chronic microvascular ischemic changes. Vascular: No hyperdense vessel or unexpected calcification. Skull: Calvarium is unremarkable. Sinuses/Orbits: Minor mucosal thickening.  Orbits are unremarkable. Other: None. IMPRESSION: No evidence of acute intracranial injury. 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 Electronically Signed   By: Macy Mis M.D.   On: 02/16/2020 11:31   CT Cervical Spine Wo Contrast  Result Date: 02/16/2020 CLINICAL DATA:  Trauma. [address] and landed on an [facility] unit. EXAM: CT CERVICAL SPINE WITHOUT CONTRAST TECHNIQUE: Multidetector CT imaging of the cervical spine was performed without intravenous contrast. Multiplanar CT image reconstructions were also generated. COMPARISON:  None. FINDINGS: Alignment: Mild cervical spine  straightening. No listhesis. Skull base and vertebrae: Advanced left C2-3 facet arthropathy with slight facet joint widening, likely degenerative. More prominent widening of the right C5-6 facet joint with milder underlying arthropathy. Possible mild widening of the C6-7 disc space anteriorly. No acute cervical spine fracture identified. Small T1 superior endplate Schmorl's node. Soft tissues and spinal canal: No prevertebral fluid or swelling. No visible canal hematoma. Disc levels: Mild multilevel cervical disc degeneration and moderate to severe multilevel facet arthrosis. No evidence of high-grade spinal or neural foraminal stenosis. Upper chest: Clear lung apices. Other: None. IMPRESSION: 1. Right C5-6 facet joint widening which could be traumatic or degenerative with possible mild widening of the C6-7 disc space. Cervical spine MRI is recommended to evaluate for soft tissue/ligamentous injury. 2. No acute cervical spine fracture identified. Electronically Signed   By: Logan Bores M.D.   On: 02/16/2020 11:41   CT Knee Left Wo Contrast  Result Date: 02/16/2020 CLINICAL DATA:  [address] 15 feet and injured knee. EXAM: CT OF THE left KNEE WITHOUT CONTRAST TECHNIQUE: Multidetector CT imaging of the left knee was performed according to the standard protocol. Multiplanar CT image reconstructions were also generated. COMPARISON:  Radiographs same date. FINDINGS: Complex comminuted proximal tibia fracture. The main fracture line is a transverse fracture through the tibial metaphysis with marked comminution and posterior and mild lateral displacement. There are also oblique coursing associated tibial plateau fractures. There is significant depression and comminution involving the anterior aspect of the lateral tibial plateau. Maximum depression is approximately 8 mm. There is a nondisplaced nondepressed fracture involving the medial plateau along the medial tibial spine and out through the posterior cortex. Mildly  displaced avulsion type fracture involving the fibular head. The femur and patella are intact. There is fairly marked displacement of the avulse tibial tubercle fragment. Large lipohemarthrosis is noted. The quadriceps and patellar tendons appear grossly intact. The ACL and PCL are grossly intact by CT. The medial collateral ligament complex appears to be intact. The LCL complex is difficult to evaluate. IMPRESSION: 1. Complex comminuted proximal tibia fractures as discussed above. 2. Mildly displaced avulsion type fracture involving the fibular head. 3. Large lipohemarthrosis. Electronically Signed   By: Marijo Sanes M.D.   On: 02/16/2020 11:45   DG Pelvis Portable  Result Date: 02/16/2020 CLINICAL DATA:  Fall. EXAM: PORTABLE PELVIS 1-2 VIEWS COMPARISON:  None. FINDINGS: There is no evidence of pelvic fracture or diastasis. No pelvic bone lesions are seen. IMPRESSION: Negative. Electronically Signed   By: Marijo Conception M.D.   On: 02/16/2020 11:01   DG Chest Portable 1 View  Result Date: 02/16/2020 CLINICAL DATA:  Fall. EXAM: PORTABLE CHEST 1 VIEW COMPARISON:  None. FINDINGS: The heart size and mediastinal contours are within normal limits. Left lung is clear. No pleural effusion is noted. Mildly displaced fracture is  seen involving lateral portion of the right seventh rib. There appears to be a small right basilar pneumothorax laterally in the right lung base. IMPRESSION: Mildly displaced right seventh rib fracture. Probable small right basilar pneumothorax is noted laterally. CT scan may be performed for further evaluation. Electronically Signed   By: Lupita Raider M.D.   On: 02/16/2020 10:59   DG Tibia/Fibula Left Port  Result Date: 02/16/2020 CLINICAL DATA:  Fall with open wounds on both shins. EXAM: PORTABLE LEFT TIBIA AND FIBULA - 2 VIEW COMPARISON:  None. FINDINGS: There is a comminuted fracture of the proximal tibia which appears to extend into both the medial and lateral tibial plateaus on  either side of the tibial spines. The fracture extends into the proximal tibial shaft with multiple mildly displaced fragments. There is overlying soft tissue swelling with scattered small foci of gas consistent with the provided history of an open wound. No definite fibular fracture is identified. The knee and ankle are located. Patellar and calcaneal spurs are noted. IMPRESSION: Comminuted fracture of the proximal tibia. Electronically Signed   By: Sebastian Ache M.D.   On: 02/16/2020 11:11   DG Tibia/Fibula Right Port  Result Date: 02/16/2020 CLINICAL DATA:  Fall with open wounds on both shins. EXAM: PORTABLE RIGHT TIBIA AND FIBULA - 2 VIEW COMPARISON:  None. FINDINGS: There is soft tissue injury with swelling anterior to the mid tibial shaft. No radiopaque foreign body or acute fracture is identified. The knee and ankle are located. Nonspecific soft tissue calcifications are noted about the knee. There are superior and inferior patellar enthesophytes, and there is mild femorotibial marginal spurring. IMPRESSION: Soft tissue swelling without acute osseous abnormality. Electronically Signed   By: Sebastian Ache M.D.   On: 02/16/2020 11:07    X-ray of the left tibia/fibula: Independently reviewed.  Fracture of the left tibia  Time spent: >45 minutes  Cameron Velasquez A Katrinka Blazing [facility] Pager [phone]  If 7PM-7AM, please contact night-coverage [email or web address] Password [facility] 02/16/2020, 11:56 AM

## 2020-02-16 NOTE — Progress Notes (Signed)
Pt's wedding ring place in pink denture cup with name on it and put in pt's belongings bag.

## 2020-02-16 NOTE — ED Provider Notes (Signed)
[facility] EMERGENCY DEPARTMENT Provider Note   CSN: [id number] Arrival date & time: 02/16/20  1015     History Chief Complaint  Patient presents with   Cameron Velasquez is a 64 y.o. male.  HPI    Patient presents as a trauma.  He arrives via EMS, and history is obtained by the patient and their individuals. Patient was in his usual state of health, when, while cleaning out gutters, approximately 15 feet off the ground ladder he fell.  He landed against a solid object, air conditioning unit.  No loss of consciousness.  Since the event he has had severe pain in his left lower extremity, but also in the right.  There is also pain in the right upper chest wall.  No confusion, disorientation, weakness in any extremity, though he is hesitant to move his lower extremity secondary to pain.   Past Medical History:  Diagnosis Date   Diabetes mellitus without complication (HCC)    Hypertension     There are no problems to display for this patient.   History reviewed. No pertinent surgical history.     No family history on file.  Social History   Tobacco Use   Smoking status: Not on file  Substance Use Topics   Alcohol use: Not on file   Drug use: Not on file    Home Medications Prior to Admission medications   Medication Sig Start Date End Date Taking? Authorizing Provider  aspirin 81 MG chewable tablet Chew 81 mg by mouth daily. 01/05/20 12/30/20 Yes [provider]  b complex vitamins capsule Take 1 capsule by mouth daily.   Yes [provider]  Dulaglutide (TRULICITY) 1.5 MG/0.5ML SOPN Inject 1.5 mg into the skin every Sunday. 01/05/20 01/04/21 Yes [provider]  ergocalciferol (VITAMIN D2) 1.25 MG (50000 UT) capsule Take 50,000 Units by mouth every Monday. 01/05/20  Yes [provider]  gabapentin (NEURONTIN) 300 MG capsule Take 300 mg by mouth at bedtime. 01/05/20 07/03/20 Yes [provider]    glimepiride (AMARYL) 2 MG tablet Take 4 mg by mouth daily. 01/05/20 12/30/20 Yes [provider]  lisinopril (ZESTRIL) 20 MG tablet Take 20 mg by mouth at bedtime. 01/05/20 07/03/20 Yes [provider]  meloxicam (MOBIC) 15 MG tablet Take 15 mg by mouth. 01/05/20 07/03/20 Yes [provider]  metFORMIN (GLUCOPHAGE-XR) 500 MG 24 hr tablet Take 1,000 mg by mouth in the morning and at bedtime. 01/12/20  Yes [provider]  pantoprazole (PROTONIX) 20 MG tablet Take 20 mg by mouth daily. 01/05/20 07/03/20 Yes [provider]  rosuvastatin (CRESTOR) 10 MG tablet Take 10 mg by mouth at bedtime. 01/05/20 12/30/20 Yes [provider]  traMADol (ULTRAM) 50 MG tablet Take 50 mg by mouth at bedtime. for pain 12/26/19   [provider]    Allergies    Patient has no known allergies.  Review of Systems   Review of Systems  Constitutional:       Per HPI, otherwise negative  HENT:       Per HPI, otherwise negative  Respiratory:       Per HPI, otherwise negative  Cardiovascular:       Per HPI, otherwise negative  Gastrointestinal: Negative for vomiting.  Endocrine:       Negative aside from HPI  Genitourinary:       Neg aside from HPI   Musculoskeletal:       Per HPI,  otherwise negative  Skin: Positive for wound.  Allergic/Immunologic: Negative for immunocompromised state.  Neurological: Negative for syncope.    Physical Exam Updated Vital Signs BP (!) 150/100    Temp (!) 97.4 F (36.3 C) (Temporal)    Resp 18    Ht 5\' 11"  (1.803 m)    Wt 120.2 kg    SpO2 97%    BMI 36.96 kg/m   Physical Exam Vitals and nursing note reviewed.  Constitutional:      General: He is in acute distress.     Appearance: He is well-developed. He is ill-appearing and diaphoretic.  HENT:     Head: Normocephalic and atraumatic.  Eyes:     Conjunctiva/sclera: Conjunctivae normal.  Neck:     Comments: Cervical collar in place Cardiovascular:     Rate and Rhythm:  Regular rhythm. Tachycardia present.  Pulmonary:     Effort: Pulmonary effort is normal. No respiratory distress.     Breath sounds: No stridor.     Comments: Chest wall tenderness throughout the right upper lateral aspect, no crepitus, no flail chest Abdominal:     General: There is no distension.  Musculoskeletal:       Legs:     Comments: Patient has gross deformity of the left lower leg, with more swelling, deformity, displacement just inferior to the midpoint of the knee.  Patient is unwilling to move the knee secondary to pain. Some discomfort about the right knee, but no gross deformities.  Skin:    General: Skin is warm.  Neurological:     Mental Status: He is alert and oriented to person, place, and time.     ED Results / Procedures / Treatments   Labs (all labs ordered are listed, but only abnormal results are displayed) Labs Reviewed  CBC - Abnormal; Notable for the following components:      Result Value   WBC 10.6 (*)    All other components within normal limits  I-STAT CHEM 8, ED - Abnormal; Notable for the following components:   Glucose, Bld 152 (*)    Calcium, Ion 1.00 (*)    All other components within normal limits  RESPIRATORY PANEL BY RT PCR (FLU A&B, COVID)  COMPREHENSIVE METABOLIC PANEL  ETHANOL  URINALYSIS, ROUTINE W REFLEX MICROSCOPIC  LACTIC ACID, PLASMA  PROTIME-INR  SAMPLE TO BLOOD BANK    EKG None  Radiology CT Head Wo Contrast  Result Date: 02/16/2020 CLINICAL DATA:  Trauma EXAM: CT HEAD WITHOUT CONTRAST TECHNIQUE: Contiguous axial images were obtained from the base of the skull through the vertex without intravenous contrast. COMPARISON:  None. FINDINGS: Brain: There is no acute intracranial hemorrhage, mass effect, or edema. There is a small subacute or chronic infarct of the right occipital lobe. Gray-white differentiation is otherwise preserved. There is no extra-axial fluid collection. Ventricles and sulci are within normal limits in  size and configuration. Patchy hypoattenuation in the supratentorial white matter is nonspecific but may reflect mild chronic microvascular ischemic changes. Vascular: No hyperdense vessel or unexpected calcification. Skull: Calvarium is unremarkable. Sinuses/Orbits: Minor mucosal thickening.  Orbits are unremarkable. Other: None. IMPRESSION: No evidence of acute intracranial injury. 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 Electronically Signed   By: 04/17/2020 M.D.   On: 02/16/2020 11:31   CT Cervical Spine Wo Contrast  Result Date: 02/16/2020 CLINICAL DATA:  Trauma. 04/17/2020 and landed on an [facility] unit. EXAM: CT CERVICAL SPINE WITHOUT CONTRAST TECHNIQUE: Multidetector CT imaging of the cervical spine was  performed without intravenous contrast. Multiplanar CT image reconstructions were also generated. COMPARISON:  None. FINDINGS: Alignment: Mild cervical spine straightening. No listhesis. Skull base and vertebrae: Advanced left C2-3 facet arthropathy with slight facet joint widening, likely degenerative. More prominent widening of the right C5-6 facet joint with milder underlying arthropathy. Possible mild widening of the C6-7 disc space anteriorly. No acute cervical spine fracture identified. Small T1 superior endplate Schmorl's node. Soft tissues and spinal canal: No prevertebral fluid or swelling. No visible canal hematoma. Disc levels: Mild multilevel cervical disc degeneration and moderate to severe multilevel facet arthrosis. No evidence of high-grade spinal or neural foraminal stenosis. Upper chest: Clear lung apices. Other: None. IMPRESSION: 1. Right C5-6 facet joint widening which could be traumatic or degenerative with possible mild widening of the C6-7 disc space. Cervical spine MRI is recommended to evaluate for soft tissue/ligamentous injury. 2. No acute cervical spine fracture identified. Electronically Signed   By: Sebastian AcheAllen  Grady M.D.   On: 02/16/2020 11:41   CT Knee Left Wo  Contrast  Result Date: 02/16/2020 CLINICAL DATA:  Larey SeatFell 15 feet and injured knee. EXAM: CT OF THE left KNEE WITHOUT CONTRAST TECHNIQUE: Multidetector CT imaging of the left knee was performed according to the standard protocol. Multiplanar CT image reconstructions were also generated. COMPARISON:  Radiographs same date. FINDINGS: Complex comminuted proximal tibia fracture. The main fracture line is a transverse fracture through the tibial metaphysis with marked comminution and posterior and mild lateral displacement. There are also oblique coursing associated tibial plateau fractures. There is significant depression and comminution involving the anterior aspect of the lateral tibial plateau. Maximum depression is approximately 8 mm. There is a nondisplaced nondepressed fracture involving the medial plateau along the medial tibial spine and out through the posterior cortex. Mildly displaced avulsion type fracture involving the fibular head. The femur and patella are intact. There is fairly marked displacement of the avulse tibial tubercle fragment. Large lipohemarthrosis is noted. The quadriceps and patellar tendons appear grossly intact. The ACL and PCL are grossly intact by CT. The medial collateral ligament complex appears to be intact. The LCL complex is difficult to evaluate. IMPRESSION: 1. Complex comminuted proximal tibia fractures as discussed above. 2. Mildly displaced avulsion type fracture involving the fibular head. 3. Large lipohemarthrosis. Electronically Signed   By: Rudie MeyerP.  Gallerani M.D.   On: 02/16/2020 11:45   MR Cervical Spine Wo Contrast  Result Date: 02/16/2020 CLINICAL DATA:  Fell 15 feet. Back and bilateral leg pain. EXAM: MRI CERVICAL SPINE WITHOUT CONTRAST TECHNIQUE: Multiplanar, multisequence MR imaging of the cervical spine was performed. No intravenous contrast was administered. COMPARISON:  CT cervical spine, same date. FINDINGS: Examination is quite limited due to patient motion.  Alignment: Normal overall alignment. Vertebrae: Normal marrow signal. No bone lesions or fractures. Cord: Grossly normal cord signal intensity. No cord lesions or cord edema. Posterior Fossa, vertebral arteries, paraspinal tissues: No significant findings. No abnormal prevertebral soft tissue swelling or hematoma. Disc levels: No significant cervical disc protrusions. No canal compromise. No significant foraminal stenosis. As demonstrated on the CT scan there is widening of the right C5-6 facet joint but I do not see any surrounding inflammatory changes or marrow edema to suggest this is traumatic. It is most likely degenerative. IMPRESSION: 1. Limited examination due to patient motion. 2. No significant cervical disc protrusions, canal compromise or foraminal stenosis. 3. Normal MR appearance of the cervical spinal cord. 4. As noted on the CT scan there is widening of the right C5-6  facet joint but no surrounding inflammatory changes or marrow edema to suggest an acute traumatic injury. Electronically Signed   By: Marijo Sanes M.D.   On: 02/16/2020 13:49   DG Pelvis Portable  Result Date: 02/16/2020 CLINICAL DATA:  Fall. EXAM: PORTABLE PELVIS 1-2 VIEWS COMPARISON:  None. FINDINGS: There is no evidence of pelvic fracture or diastasis. No pelvic bone lesions are seen. IMPRESSION: Negative. Electronically Signed   By: Marijo Conception M.D.   On: 02/16/2020 11:01   DG Chest Portable 1 View  Result Date: 02/16/2020 CLINICAL DATA:  Fall. EXAM: PORTABLE CHEST 1 VIEW COMPARISON:  None. FINDINGS: The heart size and mediastinal contours are within normal limits. Left lung is clear. No pleural effusion is noted. Mildly displaced fracture is seen involving lateral portion of the right seventh rib. There appears to be a small right basilar pneumothorax laterally in the right lung base. IMPRESSION: Mildly displaced right seventh rib fracture. Probable small right basilar pneumothorax is noted laterally. CT scan may be  performed for further evaluation. Electronically Signed   By: Marijo Conception M.D.   On: 02/16/2020 10:59   DG Tibia/Fibula Left Port  Result Date: 02/16/2020 CLINICAL DATA:  Fall with open wounds on both shins. EXAM: PORTABLE LEFT TIBIA AND FIBULA - 2 VIEW COMPARISON:  None. FINDINGS: There is a comminuted fracture of the proximal tibia which appears to extend into both the medial and lateral tibial plateaus on either side of the tibial spines. The fracture extends into the proximal tibial shaft with multiple mildly displaced fragments. There is overlying soft tissue swelling with scattered small foci of gas consistent with the provided history of an open wound. No definite fibular fracture is identified. The knee and ankle are located. Patellar and calcaneal spurs are noted. IMPRESSION: Comminuted fracture of the proximal tibia. Electronically Signed   By: Logan Bores M.D.   On: 02/16/2020 11:11   DG Tibia/Fibula Right Port  Result Date: 02/16/2020 CLINICAL DATA:  Fall with open wounds on both shins. EXAM: PORTABLE RIGHT TIBIA AND FIBULA - 2 VIEW COMPARISON:  None. FINDINGS: There is soft tissue injury with swelling anterior to the mid tibial shaft. No radiopaque foreign body or acute fracture is identified. The knee and ankle are located. Nonspecific soft tissue calcifications are noted about the knee. There are superior and inferior patellar enthesophytes, and there is mild femorotibial marginal spurring. IMPRESSION: Soft tissue swelling without acute osseous abnormality. Electronically Signed   By: Logan Bores M.D.   On: 02/16/2020 11:07    Procedures Procedures (including critical care time)  Medications Ordered in ED Medications  fentaNYL (SUBLIMAZE) injection 100 mcg (100 mcg Intravenous Given 02/16/20 1019)    ED Course  I have reviewed the triage vital signs and the nursing notes.  Pertinent labs & imaging results that were available during my care of the patient were reviewed by me  and considered in my medical decision making (see chart for details).   Patient continues to have pain after initial evaluation.  With consideration of fracture bedside x-rays performed, and given the patient's mechanism head CT, neck CT also performed.   Update:, Findings discussed with orthopedic team soon after initial evaluation and with concern for knee fracture, CT scan will be added to initial studies.  Update:, CT consistent with tibial plateau fracture.  Patient remains awake and alert, though with pain in this area.  Other findings notable for possible rib fracture, possible pneumothorax.  Update:, Patient's wound will be  repaired in the operating room, while he has repair of his tibial plateau fracture, patient will be admitted to the orthopedic team, and per request I discussed his case with our internal medicine team to follow as a consulting service.  I discussed the patient's findings again with our orthopedic colleagues, and now on reviewing the patient's initial x-rays, he may have a small pneumothorax, this was discussed with the primary care team, they note that the patient's surgery which is this plan for tomorrow is likely to happen today.   This large elderly male presents after mechanical fall.  Was found to have tibial plateau fracture, multiple lacerations, and small right-sided pneumothorax.  Given the patient's history, internal medicine will follow as a consulting team and he was admitted to the orthopedic team, with planned operative repair later today.  Patient was awake, alert, interactive in the emergency department portion of his course. Final Clinical Impression(s) / ED Diagnoses Final diagnoses:  Trauma    Rx / DC Orders ED Discharge Orders    None       Gerhard Munch, MD 02/16/20 1537

## 2020-02-16 NOTE — ED Notes (Signed)
Pt transported to MRI 

## 2020-02-16 NOTE — Progress Notes (Signed)
Orthopedic Tech Progress Note Patient Details:  Cameron Velasquez [birth year] [id number] Level 2 trauma Patient ID: [name], male   DOB: 02-17-1956, 64 y.o.   MRN: [id number]   [name] 02/16/2020, 10:43 AM

## 2020-02-16 NOTE — ED Notes (Signed)
Patient transported to CT 

## 2020-02-16 NOTE — Consult Note (Signed)
Wilmon Pali 27-Jul-1956  [id number].    Requesting MD: Dr. Carola Frost Chief Complaint/Reason for Consult: Rib fx, occult PTX Primary Survey: airway intact, breath sounds intact bilaterally, pulses intact peripherally   HPI: Arliss Frisina is a 64 y.o. male with a hx of HTN, HLD, DM2 who presented as a level 2 trauma earlier today.  Patient was apparently on a ladder, approximately 15 feet in the air trying to clean his gutters when he slid down the ladder and struck his left leg on the [facility] unit on the way down.  He denies any head trauma or loss of consciousness.  Patient complains of right shoulder, left leg and right rib cage pain.  He denies any headache, visual changes, neck pain, back pain, shortness of breath, abdominal pain, right leg pain or upper extremity pain.  He is not any blood thinners.  Work-up in the ED revealed mild displaced right seventh rib fracture with small right basilar pneumothorax noted laterally and a comminuted fracture of the proximal tibia on the left.  Patient CT cervical spine showed a C5-6 facet widening.  MRI was obtained and negative for traumatic findings.  Orthopedics admitted and plans for OR today.  TRH consulted for MMP.  We we are asked to see for rib fracture and pneumothorax.  ROS: Review of Systems  Constitutional: Negative for chills and fever.  Eyes: Negative for blurred vision and double vision.  Cardiovascular: Positive for chest pain.  Gastrointestinal: Negative for abdominal pain, constipation, diarrhea, nausea and vomiting.  Genitourinary: Negative for dysuria.  Musculoskeletal: Positive for falls and joint pain. Negative for back pain.  Skin:       Laceration  Neurological: Negative for sensory change.  Psychiatric/Behavioral: Negative for substance abuse.  All other systems reviewed and are negative.   No family history on file.  Past Medical History:  Diagnosis Date  . Diabetes mellitus without complication (HCC)   .  Hypertension     History reviewed. No pertinent surgical history.  Social History:  has no history on file for tobacco, alcohol, and drug. Prior tobacco abuse No alcohol or illicit drug use   Allergies: No Known Allergies  Medications Prior to Admission  Medication Sig Dispense Refill  . aspirin 81 MG chewable tablet Chew 81 mg by mouth daily.    Marland Kitchen b complex vitamins capsule Take 1 capsule by mouth daily.    . Dulaglutide (TRULICITY) 1.5 MG/0.5ML SOPN Inject 1.5 mg into the skin every Sunday.    . ergocalciferol (VITAMIN D2) 1.25 MG (50000 UT) capsule Take 50,000 Units by mouth every Monday.    . gabapentin (NEURONTIN) 300 MG capsule Take 300 mg by mouth at bedtime.    Marland Kitchen glimepiride (AMARYL) 2 MG tablet Take 4 mg by mouth daily.    Marland Kitchen lisinopril (ZESTRIL) 20 MG tablet Take 20 mg by mouth at bedtime.    . meloxicam (MOBIC) 15 MG tablet Take 15 mg by mouth.    . metFORMIN (GLUCOPHAGE-XR) 500 MG 24 hr tablet Take 1,000 mg by mouth in the morning and at bedtime.    . pantoprazole (PROTONIX) 20 MG tablet Take 20 mg by mouth daily.    . rosuvastatin (CRESTOR) 10 MG tablet Take 10 mg by mouth at bedtime.    . traMADol (ULTRAM) 50 MG tablet Take 50 mg by mouth at bedtime. for pain       Physical Exam: Blood pressure (!) 163/109, pulse 96, temperature 98.4 F (36.9 C), temperature source Oral,  resp. rate 18, height 5\' 11"  (1.803 m), weight 120.2 kg, SpO2 95 %. General: pleasant, WD/WN white male who is laying in bed in NAD HEENT: head is normocephalic, atraumatic.  Sclera are noninjected.  PERRL.  Ears and nose without any masses or lesions.  Mouth is pink and moist. Dentition fair Neck: CT c-spine and MRI reviewed. Clinical exam performed to evaluate for ligamentous injury. C-spine evaluation performed. No midline pain or TTP. Patient able to turn head left and right without midline cervical pain. Neck flexion and extension performed without midline pain. C-spine cleared and collar removed.    Heart: regular, rate, and rhythm.  Normal s1,s2. No obvious murmurs, gallops, or rubs noted.  Palpable pedal and radial pulses bilaterally  Lungs: Right chest wall tenderness. CTAB, no wheezes, rhonchi, or rales noted.  Respiratory effort nonlabored Abd: Soft, NT/ND, +BS, no masses, hernias, or organomegaly MS: No TTP of RUE, or LUE. Able rom of LUE and RUE without pain. 8cm laceration noted to right shin. Able rom of the RLE without pain. LLE with KI in place. DP 2+ b/l. No edema.  Skin: warm and dry with no masses, lesions, or rashes other than laceration as noted above Psych: A&Ox4 with an appropriate affect Neuro: cranial nerves grossly intact, strength intact BUE/BLE bilaterally, normal speech, though process intact   Results for orders placed or performed during the hospital encounter of 02/16/20 (from the past 48 hour(s))  Comprehensive metabolic panel     Status: Abnormal   Collection Time: 02/16/20 10:24 AM  Result Value Ref Range   Sodium 137 135 - 145 mmol/L   Potassium 5.1 3.5 - 5.1 mmol/L   Chloride 104 98 - 111 mmol/L   CO2 18 (L) 22 - 32 mmol/L   Glucose, Bld 155 (H) 70 - 99 mg/dL    Comment: Glucose reference range applies only to samples taken after fasting for at least 8 hours.   BUN 18 8 - 23 mg/dL   Creatinine, Ser 1.611.07 0.61 - 1.24 mg/dL   Calcium 9.3 8.9 - 09.610.3 mg/dL   Total Protein 6.7 6.5 - 8.1 g/dL   Albumin 4.2 3.5 - 5.0 g/dL   AST 37 15 - 41 U/L   ALT 21 0 - 44 U/L   Alkaline Phosphatase 56 38 - 126 U/L   Total Bilirubin 1.2 0.3 - 1.2 mg/dL   GFR calc non Af Amer >60 >60 mL/min   GFR calc Af Amer >60 >60 mL/min   Anion gap 15 5 - 15    Comment: Performed at [facility], [address] [place], [place] [address]  CBC     Status: Abnormal   Collection Time: 02/16/20 10:24 AM  Result Value Ref Range   WBC 10.6 (H) 4.0 - 10.5 K/uL   RBC 4.89 4.22 - 5.81 MIL/uL   Hemoglobin 14.8 13.0 - 17.0 g/dL   HCT 98.144.2 19.139.0 - 47.852.0 %   MCV 90.4 80.0 - 100.0 fL    MCH 30.3 26.0 - 34.0 pg   MCHC 33.5 30.0 - 36.0 g/dL   RDW 29.512.8 62.111.5 - 30.815.5 %   Platelets 177 150 - 400 K/uL   nRBC 0.0 0.0 - 0.2 %    Comment: Performed at [facility], [address] [place], [place] [address]  Lactic acid, plasma     Status: Abnormal   Collection Time: 02/16/20 10:24 AM  Result Value Ref Range   Lactic Acid, Venous 3.5 (HH) 0.5 -  1.9 mmol/L    Comment: CRITICAL RESULT CALLED TO, READ BACK BY AND VERIFIED WITH: C BAIN,RN 02/16/2020 1129 WILDERK Performed at [facility], [address], [place], [place] [address]   Sample to Blood Bank     Status: None   Collection Time: 02/16/20 10:27 AM  Result Value Ref Range   Blood Bank Specimen SAMPLE AVAILABLE FOR TESTING    Sample Expiration      02/17/2020,2359 Performed at [facility], [address], [place], [place] [address]   I-Stat Chem 8, ED     Status: Abnormal   Collection Time: 02/16/20 10:38 AM  Result Value Ref Range   Sodium 137 135 - 145 mmol/L   Potassium 4.8 3.5 - 5.1 mmol/L   Chloride 105 98 - 111 mmol/L   BUN 22 8 - 23 mg/dL   Creatinine, Ser 3.24 0.61 - 1.24 mg/dL   Glucose, Bld 401 (H) 70 - 99 mg/dL    Comment: Glucose reference range applies only to samples taken after fasting for at least 8 hours.   Calcium, Ion 1.00 (L) 1.15 - 1.40 mmol/L   TCO2 24 22 - 32 mmol/L   Hemoglobin 14.3 13.0 - 17.0 g/dL   HCT 02.7 25.3 - 66.4 %  Respiratory Panel by RT PCR (Flu A&B, Covid) - Nasopharyngeal Swab     Status: None   Collection Time: 02/16/20 10:48 AM   Specimen: Nasopharyngeal Swab  Result Value Ref Range   SARS Coronavirus 2 by RT PCR NEGATIVE NEGATIVE    Comment: (NOTE) SARS-CoV-2 target nucleic acids are NOT DETECTED. The SARS-CoV-2 RNA is generally detectable in upper respiratoy specimens during the acute phase of infection. The lowest concentration of SARS-CoV-2 viral copies this assay can detect is 131 copies/mL. A negative result does not preclude  SARS-Cov-2 infection and should not be used as the sole basis for treatment or other patient management decisions. A negative result may occur with  improper specimen collection/handling, submission of specimen other than nasopharyngeal swab, presence of viral mutation(s) within the areas targeted by this assay, and inadequate number of viral copies (<131 copies/mL). A negative result must be combined with clinical observations, patient history, and epidemiological information. The expected result is Negative. Fact Sheet for Patients:  https://www.moore.com/ Fact Sheet for Healthcare Providers:  https://www.young.biz/ This test is not yet ap proved or cleared by the [place] FDA and  has been authorized for detection and/or diagnosis of SARS-CoV-2 by FDA under an Emergency Use Authorization (EUA). This EUA will remain  in effect (meaning this test can be used) for the duration of the COVID-19 declaration under Section 564(b)(1) of the Act, 21 U.S.C. section 360bbb-3(b)(1), unless the authorization is terminated or revoked sooner.    Influenza A by PCR NEGATIVE NEGATIVE   Influenza B by PCR NEGATIVE NEGATIVE    Comment: (NOTE) The Xpert Xpress SARS-CoV-2/FLU/RSV assay is intended as an aid in  the diagnosis of influenza from Nasopharyngeal swab specimens and  should not be used as a sole basis for treatment. Nasal washings and  aspirates are unacceptable for Xpert Xpress SARS-CoV-2/FLU/RSV  testing. Fact Sheet for Patients: https://www.moore.com/ Fact Sheet for Healthcare Providers: https://www.young.biz/ This test is not yet approved or cleared by the [place] FDA and  has been authorized for detection and/or diagnosis of SARS-CoV-2 by  FDA under an Emergency Use Authorization (EUA). This EUA will remain  in effect (meaning this test can be used) for the duration of the  Covid-19 declaration  under Section 564(b)(1) of the Act, 21  U.S.C. section 360bbb-3(b)(1), unless the authorization is  terminated or revoked. Performed at [facility], [address], [place], [place] [address]   Ethanol     Status: None   Collection Time: 02/16/20 11:10 AM  Result Value Ref Range   Alcohol, Ethyl (B) <10 <10 mg/dL    Comment: (NOTE) Lowest detectable limit for serum alcohol is 10 mg/dL. For medical purposes only. Performed at [facility], [address], [place], [place] [address]   Protime-INR     Status: None   Collection Time: 02/16/20 11:19 AM  Result Value Ref Range   Prothrombin Time 13.7 11.4 - 15.2 seconds   INR 1.1 0.8 - 1.2    Comment: (NOTE) INR goal varies based on device and disease states. Performed at [facility], [address], [place], [place] [address]   Lactic acid, plasma     Status: Abnormal   Collection Time: 02/16/20 12:34 PM  Result Value Ref Range   Lactic Acid, Venous 2.3 (HH) 0.5 - 1.9 mmol/L    Comment: CRITICAL VALUE NOTED.  VALUE IS CONSISTENT WITH PREVIOUSLY REPORTED AND CALLED VALUE. Performed at [facility], [address] [place], [place] [address]   HIV Antibody (routine testing w rflx)     Status: None   Collection Time: 02/16/20 12:34 PM  Result Value Ref Range   HIV Screen 4th Generation wRfx NON REACTIVE NON REACTIVE    Comment: Performed at [facility], [address] [place], [place] [address]   CT Head Wo Contrast  Result Date: 02/16/2020 CLINICAL DATA:  Trauma EXAM: CT HEAD WITHOUT CONTRAST TECHNIQUE: Contiguous axial images were obtained from the base of the skull through the vertex without intravenous contrast. COMPARISON:  None. FINDINGS: Brain: There is no acute intracranial hemorrhage, mass effect, or edema. There is a small subacute or chronic infarct of the right occipital lobe. Gray-white differentiation is otherwise preserved. There is no extra-axial fluid collection. Ventricles and sulci  are within normal limits in size and configuration. Patchy hypoattenuation in the supratentorial white matter is nonspecific but may reflect mild chronic microvascular ischemic changes. Vascular: No hyperdense vessel or unexpected calcification. Skull: Calvarium is unremarkable. Sinuses/Orbits: Minor mucosal thickening.  Orbits are unremarkable. Other: None. IMPRESSION: No evidence of acute intracranial injury. 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 Electronically Signed   By: Guadlupe Spanish M.D.   On: 02/16/2020 11:31   CT Cervical Spine Wo Contrast  Result Date: 02/16/2020 CLINICAL DATA:  Trauma. Larey Seat and landed on an [facility] unit. EXAM: CT CERVICAL SPINE WITHOUT CONTRAST TECHNIQUE: Multidetector CT imaging of the cervical spine was performed without intravenous contrast. Multiplanar CT image reconstructions were also generated. COMPARISON:  None. FINDINGS: Alignment: Mild cervical spine straightening. No listhesis. Skull base and vertebrae: Advanced left C2-3 facet arthropathy with slight facet joint widening, likely degenerative. More prominent widening of the right C5-6 facet joint with milder underlying arthropathy. Possible mild widening of the C6-7 disc space anteriorly. No acute cervical spine fracture identified. Small T1 superior endplate Schmorl's node. Soft tissues and spinal canal: No prevertebral fluid or swelling. No visible canal hematoma. Disc levels: Mild multilevel cervical disc degeneration and moderate to severe multilevel facet arthrosis. No evidence of high-grade spinal or neural foraminal stenosis. Upper chest: Clear lung apices. Other: None. IMPRESSION: 1. Right C5-6 facet joint widening which could be traumatic or degenerative with possible mild  widening of the C6-7 disc space. Cervical spine MRI is recommended to evaluate for soft tissue/ligamentous injury. 2. No acute cervical spine fracture identified. Electronically Signed   By: Sebastian Ache M.D.   On: 02/16/2020 11:41    CT Knee Left Wo Contrast  Result Date: 02/16/2020 CLINICAL DATA:  Larey Seat 15 feet and injured knee. EXAM: CT OF THE left KNEE WITHOUT CONTRAST TECHNIQUE: Multidetector CT imaging of the left knee was performed according to the standard protocol. Multiplanar CT image reconstructions were also generated. COMPARISON:  Radiographs same date. FINDINGS: Complex comminuted proximal tibia fracture. The main fracture line is a transverse fracture through the tibial metaphysis with marked comminution and posterior and mild lateral displacement. There are also oblique coursing associated tibial plateau fractures. There is significant depression and comminution involving the anterior aspect of the lateral tibial plateau. Maximum depression is approximately 8 mm. There is a nondisplaced nondepressed fracture involving the medial plateau along the medial tibial spine and out through the posterior cortex. Mildly displaced avulsion type fracture involving the fibular head. The femur and patella are intact. There is fairly marked displacement of the avulse tibial tubercle fragment. Large lipohemarthrosis is noted. The quadriceps and patellar tendons appear grossly intact. The ACL and PCL are grossly intact by CT. The medial collateral ligament complex appears to be intact. The LCL complex is difficult to evaluate. IMPRESSION: 1. Complex comminuted proximal tibia fractures as discussed above. 2. Mildly displaced avulsion type fracture involving the fibular head. 3. Large lipohemarthrosis. Electronically Signed   By: Rudie Meyer M.D.   On: 02/16/2020 11:45   MR Cervical Spine Wo Contrast  Result Date: 02/16/2020 CLINICAL DATA:  Fell 15 feet. Back and bilateral leg pain. EXAM: MRI CERVICAL SPINE WITHOUT CONTRAST TECHNIQUE: Multiplanar, multisequence MR imaging of the cervical spine was performed. No intravenous contrast was administered. COMPARISON:  CT cervical spine, same date. FINDINGS: Examination is quite limited due to  patient motion. Alignment: Normal overall alignment. Vertebrae: Normal marrow signal. No bone lesions or fractures. Cord: Grossly normal cord signal intensity. No cord lesions or cord edema. Posterior Fossa, vertebral arteries, paraspinal tissues: No significant findings. No abnormal prevertebral soft tissue swelling or hematoma. Disc levels: No significant cervical disc protrusions. No canal compromise. No significant foraminal stenosis. As demonstrated on the CT scan there is widening of the right C5-6 facet joint but I do not see any surrounding inflammatory changes or marrow edema to suggest this is traumatic. It is most likely degenerative. IMPRESSION: 1. Limited examination due to patient motion. 2. No significant cervical disc protrusions, canal compromise or foraminal stenosis. 3. Normal MR appearance of the cervical spinal cord. 4. As noted on the CT scan there is widening of the right C5-6 facet joint but no surrounding inflammatory changes or marrow edema to suggest an acute traumatic injury. Electronically Signed   By: Rudie Meyer M.D.   On: 02/16/2020 13:49   DG Pelvis Portable  Result Date: 02/16/2020 CLINICAL DATA:  Fall. EXAM: PORTABLE PELVIS 1-2 VIEWS COMPARISON:  None. FINDINGS: There is no evidence of pelvic fracture or diastasis. No pelvic bone lesions are seen. IMPRESSION: Negative. Electronically Signed   By: Lupita Raider M.D.   On: 02/16/2020 11:01   DG Chest Portable 1 View  Result Date: 02/16/2020 CLINICAL DATA:  Fall. EXAM: PORTABLE CHEST 1 VIEW COMPARISON:  None. FINDINGS: The heart size and mediastinal contours are within normal limits. Left lung is clear. No pleural effusion is noted. Mildly displaced fracture is seen  involving lateral portion of the right seventh rib. There appears to be a small right basilar pneumothorax laterally in the right lung base. IMPRESSION: Mildly displaced right seventh rib fracture. Probable small right basilar pneumothorax is noted laterally.  CT scan may be performed for further evaluation. Electronically Signed   By: Marijo Conception M.D.   On: 02/16/2020 10:59   DG Tibia/Fibula Left Port  Result Date: 02/16/2020 CLINICAL DATA:  Fall with open wounds on both shins. EXAM: PORTABLE LEFT TIBIA AND FIBULA - 2 VIEW COMPARISON:  None. FINDINGS: There is a comminuted fracture of the proximal tibia which appears to extend into both the medial and lateral tibial plateaus on either side of the tibial spines. The fracture extends into the proximal tibial shaft with multiple mildly displaced fragments. There is overlying soft tissue swelling with scattered small foci of gas consistent with the provided history of an open wound. No definite fibular fracture is identified. The knee and ankle are located. Patellar and calcaneal spurs are noted. IMPRESSION: Comminuted fracture of the proximal tibia. Electronically Signed   By: Logan Bores M.D.   On: 02/16/2020 11:11   DG Tibia/Fibula Right Port  Result Date: 02/16/2020 CLINICAL DATA:  Fall with open wounds on both shins. EXAM: PORTABLE RIGHT TIBIA AND FIBULA - 2 VIEW COMPARISON:  None. FINDINGS: There is soft tissue injury with swelling anterior to the mid tibial shaft. No radiopaque foreign body or acute fracture is identified. The knee and ankle are located. Nonspecific soft tissue calcifications are noted about the knee. There are superior and inferior patellar enthesophytes, and there is mild femorotibial marginal spurring. IMPRESSION: Soft tissue swelling without acute osseous abnormality. Electronically Signed   By: Logan Bores M.D.   On: 02/16/2020 11:07   Anti-infectives (From admission, onward)   None     Assessment/Plan Fall from ladder, 62ft Left Tibia Fx - Per Ortho. NWB. KI in place. They plan for ORIF this afternoon with Dr. Marcelino Scot.  Right lower extremity laceration - Anticipate repair in OR with Ortho later today Right 7th rib fx with small PTX - No indication for further imaging or  Chest Tube at this time. Repeat CXR in AM. Okay for surgery with ortho. Multimodal pain control. Pulm toilet, IS HTN - Per TRH DM2 - Per TRH HLD - Per TRH C-Spine - Cleared  FEN - NPO for procedure  VTE - SCDs, Lovenox  ID - None currently.   Jillyn Ledger, [facility] Surgery 02/16/2020, 4:07 PM Please see Amion for pager number during day hours 7:00am-4:30pm

## 2020-02-16 NOTE — Anesthesia Preprocedure Evaluation (Addendum)
Anesthesia Evaluation  Patient identified by MRN, date of birth, ID band Patient awake    Reviewed: Allergy & Precautions, NPO status , Patient's Chart, lab work & pertinent test results  Airway Mallampati: III  TM Distance: >3 FB Neck ROM: Full    Dental no notable dental hx.    Pulmonary former smoker,    Pulmonary exam normal breath sounds clear to auscultation       Cardiovascular hypertension, Pt. on medications Normal cardiovascular exam Rhythm:Regular Rate:Normal     Neuro/Psych negative neurological ROS  negative psych ROS   GI/Hepatic Neg liver ROS, GERD  Medicated and Controlled,  Endo/Other  diabetes, Oral Hypoglycemic Agents  Renal/GU negative Renal ROS     Musculoskeletal negative musculoskeletal ROS (+)   Abdominal (+) + obese,   Peds  Hematology HLD   Anesthesia Other Findings Left tibia plateau fx  Reproductive/Obstetrics                            Anesthesia Physical Anesthesia Plan  ASA: II and emergent  Anesthesia Plan: General   Post-op Pain Management:    Induction: Intravenous and Rapid sequence  PONV Risk Score and Plan: 2 and Ondansetron, Dexamethasone, Midazolam and Treatment may vary due to age or medical condition  Airway Management Planned: Oral ETT  Additional Equipment:   Intra-op Plan:   Post-operative Plan: Extubation in OR  Informed Consent: I have reviewed the patients History and Physical, chart, labs and discussed the procedure including the risks, benefits and alternatives for the proposed anesthesia with the patient or authorized representative who has indicated his/her understanding and acceptance.     Dental advisory given  Plan Discussed with: CRNA  Anesthesia Plan Comments:        Anesthesia Quick Evaluation

## 2020-02-16 NOTE — ED Notes (Signed)
New collar applied for comfort

## 2020-02-16 NOTE — ED Triage Notes (Signed)
Pt bib ems after falling approx 5feet onto an [facility] unit. Pt with open wound to bil shins. Pt also with L leg deformity and R shoulder pain. VSS with EMS. ccollar in place.

## 2020-02-16 NOTE — ED Notes (Signed)
Ortho PA at the bedside 

## 2020-02-16 NOTE — Brief Op Note (Signed)
02/16/2020  10:58 PM  PATIENT:  Cameron Velasquez  64 y.o. male  PRE-OPERATIVE DIAGNOSIS:  1. LEFT BICONDYLAR TIBIAL PLATEAU, CLOSED 2. RIGHT TIBIAL SHAFT FRACTURE, OPEN, NONDISPLACED 3. LEFT KNEE HEMARTHROSIS 4. LEFT LEG WOUND, CALF  SURGEON:  Surgeon(s) and Role:    Myrene Galas, MD - Primary  DICTATION: .Other Dictation: Dictation Number [id number]

## 2020-02-17 ENCOUNTER — Encounter (HOSPITAL_COMMUNITY)
Admission: EM | Disposition: A | Discharge status: Rehabilitation Facility | Payer: Self-pay | Source: Home / Self Care | Attending: Orthopedic Surgery

## 2020-02-17 ENCOUNTER — Inpatient Hospital Stay (HOSPITAL_COMMUNITY): Payer: Medicare Other

## 2020-02-17 LAB — COMPREHENSIVE METABOLIC PANEL
ALT: 18 U/L (ref 0–44)
AST: 28 U/L (ref 15–41)
Albumin: 3.4 g/dL — ABNORMAL LOW (ref 3.5–5.0)
Alkaline Phosphatase: 43 U/L (ref 38–126)
Anion gap: 12 (ref 5–15)
BUN: 24 mg/dL — ABNORMAL HIGH (ref 8–23)
CO2: 22 mmol/L (ref 22–32)
Calcium: 8.2 mg/dL — ABNORMAL LOW (ref 8.9–10.3)
Chloride: 105 mmol/L (ref 98–111)
Creatinine, Ser: 1.4 mg/dL — ABNORMAL HIGH (ref 0.61–1.24)
GFR calc Af Amer: >60 mL/min (ref >60)
GFR calc non Af Amer: 52 mL/min — ABNORMAL LOW (ref >60)
Glucose, Bld: 207 mg/dL — ABNORMAL HIGH (ref 70–99)
Potassium: 5 mmol/L (ref 3.5–5.1)
Sodium: 139 mmol/L (ref 135–145)
Total Bilirubin: 0.8 mg/dL (ref 0.3–1.2)
Total Protein: 6 g/dL — ABNORMAL LOW (ref 6.5–8.1)

## 2020-02-17 LAB — CBC
HCT: 35.2 % — ABNORMAL LOW (ref 39.0–52.0)
Hemoglobin: 11.7 g/dL — ABNORMAL LOW (ref 13.0–17.0)
MCH: 30.3 pg (ref 26.0–34.0)
MCHC: 33.2 g/dL (ref 30.0–36.0)
MCV: 91.2 fL (ref 80.0–100.0)
Platelets: 197 10*3/uL (ref 150–400)
RBC: 3.86 MIL/uL — ABNORMAL LOW (ref 4.22–5.81)
RDW: 13.2 % (ref 11.5–15.5)
WBC: 15.1 10*3/uL — ABNORMAL HIGH (ref 4.0–10.5)
nRBC: 0 % (ref 0.0–0.2)

## 2020-02-17 LAB — CK: Total CK: 414 U/L — ABNORMAL HIGH (ref 49–397)

## 2020-02-17 LAB — GLUCOSE, CAPILLARY
Glucose-Capillary: 182 mg/dL — ABNORMAL HIGH (ref 70–99)
Glucose-Capillary: 152 mg/dL — ABNORMAL HIGH (ref 70–99)
Glucose-Capillary: 166 mg/dL — ABNORMAL HIGH (ref 70–99)
Glucose-Capillary: 176 mg/dL — ABNORMAL HIGH (ref 70–99)

## 2020-02-17 LAB — HEMOGLOBIN A1C
Hgb A1c MFr Bld: 5.6 % (ref 4.8–5.6)
Mean Plasma Glucose: 114 mg/dL

## 2020-02-17 LAB — VITAMIN D 25 HYDROXY (VIT D DEFICIENCY, FRACTURES): Vit D, 25-Hydroxy: 67.75 ng/mL (ref 30–100)

## 2020-02-17 SURGERY — OPEN REDUCTION INTERNAL FIXATION (ORIF) TIBIAL PLATEAU
Anesthesia: General | Laterality: Left

## 2020-02-17 MED ORDER — DOCUSATE SODIUM 100 MG PO CAPS
100.0000 mg | ORAL_CAPSULE | Freq: Two times a day (BID) | ORAL | Status: DC
  Administered 2020-02-17 – 2020-02-21 (×10): 100 mg via ORAL
  Filled 2020-02-17 (×11): qty 1

## 2020-02-17 MED ORDER — MIDAZOLAM HCL 2 MG/2ML IJ SOLN: 2.0000 mg | Freq: Once | INTRAMUSCULAR | Status: AC

## 2020-02-17 MED ORDER — ONDANSETRON HCL 4 MG PO TABS: 4.0000 mg | ORAL_TABLET | Freq: Four times a day (QID) | ORAL | Status: DC | PRN

## 2020-02-17 MED ORDER — ACETAMINOPHEN 325 MG PO TABS: 325.0000 mg | ORAL_TABLET | Freq: Four times a day (QID) | ORAL | Status: DC | PRN

## 2020-02-17 MED ORDER — POTASSIUM CHLORIDE IN NACL 20-0.9 MEQ/L-% IV SOLN
INTRAVENOUS | Status: DC
  Filled 2020-02-17: qty 1000

## 2020-02-17 MED ORDER — CEFAZOLIN SODIUM-DEXTROSE 2-4 GM/100ML-% IV SOLN
2.0000 g | Freq: Three times a day (TID) | INTRAVENOUS | Status: AC
  Administered 2020-02-17 (×3): 2 g via INTRAVENOUS
  Filled 2020-02-17 (×2): qty 100

## 2020-02-17 MED ORDER — MORPHINE SULFATE (PF) 2 MG/ML IV SOLN
0.5000 mg | INTRAVENOUS | Status: DC | PRN
  Administered 2020-02-18 – 2020-02-24 (×5): 1 mg via INTRAVENOUS
  Filled 2020-02-17 (×5): qty 1

## 2020-02-17 MED ORDER — ENOXAPARIN SODIUM 40 MG/0.4ML ~~LOC~~ SOLN
40.0000 mg | SUBCUTANEOUS | Status: DC
  Administered 2020-02-17 – 2020-02-26 (×9): 40 mg via SUBCUTANEOUS
  Filled 2020-02-17 (×9): qty 0.4

## 2020-02-17 MED ORDER — FENTANYL CITRATE (PF) 100 MCG/2ML IJ SOLN: 50.0000 ug | Freq: Once | INTRAMUSCULAR | Status: AC

## 2020-02-17 MED ORDER — ACETAMINOPHEN 500 MG PO TABS
500.0000 mg | ORAL_TABLET | Freq: Two times a day (BID) | ORAL | Status: DC
  Administered 2020-02-17 – 2020-02-26 (×5): 500 mg via ORAL
  Filled 2020-02-17 (×17): qty 1

## 2020-02-17 MED ORDER — METHOCARBAMOL 1000 MG/10ML IJ SOLN
500.0000 mg | Freq: Four times a day (QID) | INTRAVENOUS | Status: DC | PRN
  Filled 2020-02-17 (×2): qty 5

## 2020-02-17 MED ORDER — METOCLOPRAMIDE HCL 5 MG/ML IJ SOLN: 5.0000 mg | Freq: Three times a day (TID) | INTRAMUSCULAR | Status: DC | PRN

## 2020-02-17 MED ORDER — VITAMIN D 25 MCG (1000 UNIT) PO TABS
2000.0000 [IU] | ORAL_TABLET | Freq: Two times a day (BID) | ORAL | Status: DC
  Administered 2020-02-17 – 2020-02-22 (×12): 2000 [IU] via ORAL
  Administered 2020-02-22: 1000 [IU] via ORAL
  Administered 2020-02-23 – 2020-02-26 (×6): 2000 [IU] via ORAL
  Filled 2020-02-17 (×19): qty 2

## 2020-02-17 MED ORDER — METHOCARBAMOL 500 MG PO TABS
500.0000 mg | ORAL_TABLET | Freq: Four times a day (QID) | ORAL | Status: DC | PRN
  Administered 2020-02-18: 500 mg via ORAL
  Administered 2020-02-19 – 2020-02-23 (×4): 1000 mg via ORAL
  Administered 2020-02-24: 500 mg via ORAL
  Administered 2020-02-24 – 2020-02-26 (×8): 1000 mg via ORAL
  Filled 2020-02-17 (×8): qty 2
  Filled 2020-02-17: qty 1
  Filled 2020-02-17 (×6): qty 2

## 2020-02-17 MED ORDER — MIDAZOLAM HCL 2 MG/2ML IJ SOLN
INTRAMUSCULAR | Status: AC
  Administered 2020-02-17: 2 mg via INTRAVENOUS
  Filled 2020-02-17: qty 2

## 2020-02-17 MED ORDER — HYDROCODONE-ACETAMINOPHEN 7.5-325 MG PO TABS
1.0000 | ORAL_TABLET | ORAL | Status: DC | PRN
  Administered 2020-02-18 – 2020-02-25 (×21): 2 via ORAL
  Filled 2020-02-17 (×22): qty 2

## 2020-02-17 MED ORDER — SODIUM CHLORIDE 0.9 % IV SOLN: INTRAVENOUS | Status: DC

## 2020-02-17 MED ORDER — FENTANYL CITRATE (PF) 100 MCG/2ML IJ SOLN
INTRAMUSCULAR | Status: AC
  Administered 2020-02-17: 50 ug via INTRAVENOUS
  Filled 2020-02-17: qty 2

## 2020-02-17 MED ORDER — ASCORBIC ACID 500 MG PO TABS
500.0000 mg | ORAL_TABLET | Freq: Every day | ORAL | Status: DC
  Administered 2020-02-17 – 2020-02-26 (×9): 500 mg via ORAL
  Filled 2020-02-17 (×9): qty 1

## 2020-02-17 MED ORDER — ONDANSETRON HCL 4 MG/2ML IJ SOLN
4.0000 mg | Freq: Four times a day (QID) | INTRAMUSCULAR | Status: DC | PRN
  Administered 2020-02-18: 4 mg via INTRAVENOUS
  Filled 2020-02-17: qty 2

## 2020-02-17 MED ORDER — TRAMADOL HCL 50 MG PO TABS
50.0000 mg | ORAL_TABLET | Freq: Three times a day (TID) | ORAL | Status: DC | PRN
  Administered 2020-02-17 – 2020-02-21 (×5): 50 mg via ORAL
  Filled 2020-02-17 (×6): qty 1

## 2020-02-17 MED ORDER — IOHEXOL 300 MG/ML  SOLN
75.0000 mL | Freq: Once | INTRAMUSCULAR | Status: AC | PRN
  Administered 2020-02-17: 75 mL via INTRAVENOUS

## 2020-02-17 MED ORDER — HYDROCODONE-ACETAMINOPHEN 5-325 MG PO TABS
1.0000 | ORAL_TABLET | ORAL | Status: DC | PRN
  Administered 2020-02-17 – 2020-02-26 (×22): 2 via ORAL
  Filled 2020-02-17 (×23): qty 2

## 2020-02-17 MED ORDER — POLYETHYLENE GLYCOL 3350 17 G PO PACK
17.0000 g | PACK | Freq: Every day | ORAL | Status: DC
  Administered 2020-02-18 – 2020-02-21 (×3): 17 g via ORAL
  Filled 2020-02-17 (×10): qty 1

## 2020-02-17 MED ORDER — METOCLOPRAMIDE HCL 5 MG PO TABS: 5.0000 mg | ORAL_TABLET | Freq: Three times a day (TID) | ORAL | Status: DC | PRN

## 2020-02-17 NOTE — Anesthesia Postprocedure Evaluation (Signed)
Anesthesia Post Note  Patient: Cameron Velasquez  Procedure(s) Performed: IRRIGATION AND DEBRIDEMENT OPEN FRACTURE EXTREMITY (Bilateral ) EXTERNAL FIXATION LEG (Left ) Arthrocentesis Injection (Left ) Closed Reduction Tibia (Left )     Patient location during evaluation: PACU Anesthesia Type: General Level of consciousness: awake Pain management: pain level controlled Vital Signs Assessment: post-procedure vital signs reviewed and stable Respiratory status: spontaneous breathing, nonlabored ventilation, respiratory function stable and patient connected to nasal cannula oxygen Cardiovascular status: blood pressure returned to baseline and stable Postop Assessment: no apparent nausea or vomiting Anesthetic complications: no    Last Vitals:  Vitals:   02/17/20 0208 02/17/20 0415  BP: 137/68 117/70  Pulse: 90 100  Resp:    Temp: 36.5 C 36.7 C  SpO2: 98% 98%    Last Pain:  Vitals:   02/17/20 0615  TempSrc:   PainSc: 3                  Kimmie Doren P Hasnain Manheim

## 2020-02-17 NOTE — Plan of Care (Signed)

## 2020-02-17 NOTE — Progress Notes (Signed)
1 Day Post-Op  Subjective: CC: S/p surgery with ortho yesterday. Notes that his pain is mainly in his right rib today when he takes a deep breath. No SOB. He has been using his IS and pulling 2000. No abdominal pain or other new areas of pain. On 2L, however no desats overnight and no increased work of breathing.   Objective: Vital signs in last 24 hours: Temp:  [97.2 F (36.2 C)-98.7 F (37.1 C)] 98.1 F (36.7 C) (04/13 0415) Pulse Rate:  [70-105] 100 (04/13 0415) Resp:  [12-30] 20 (04/13 0036) BP: (117-179)/(68-109) 117/70 (04/13 0415) SpO2:  [93 %-100 %] 98 % (04/13 0415) Weight:  [120.2 kg] 120.2 kg (04/12 1029) Last BM Date: 02/16/20  Intake/Output from previous day: 04/12 0701 - 04/13 0700 In: 1550 [I.V.:1500; IV Piggyback:50] Out: 300 [Urine:300] Intake/Output this shift: No intake/output data recorded.  PE: Gen:  Alert, NAD, pleasant Card:  RRR, no M/G/R heard Pulm:  On 2L. CTAB, no W/R/R, effort normal. Pulling 2000 on IS.  Abd: Soft, NT/ND, +BS Ext: No TTP of RUE, or LUE. Able rom of LUE and RUE without pain. RLE with ace bandage in place. Wiggles toes of RLE. SILT. LLE with ace bandage in place and ex-fix. Wiggles toes of LLE. SILT.  Cap refill < 2 seconds b/l Psych: A&Ox3  Skin: no rashes noted, warm and dry  Lab Results:  Recent Labs    02/16/20 1024 02/16/20 1024 02/16/20 1038 02/17/20 0419  WBC 10.6*  --   --  15.1*  HGB 14.8   < > 14.3 11.7*  HCT 44.2   < > 42.0 35.2*  PLT 177  --   --  197   < > = values in this interval not displayed.   BMET Recent Labs    02/16/20 1024 02/16/20 1024 02/16/20 1038 02/17/20 0419  NA 137   < > 137 139  K 5.1   < > 4.8 5.0  CL 104   < > 105 105  CO2 18*  --   --  22  GLUCOSE 155*   < > 152* 207*  BUN 18   < > 22 24*  CREATININE 1.07   < > 0.90 1.40*  CALCIUM 9.3  --   --  8.2*   < > = values in this interval not displayed.   PT/INR Recent Labs    02/16/20 1119  LABPROT 13.7  INR 1.1   CMP    Component Value Date/Time   NA 139 02/17/2020 0419   K 5.0 02/17/2020 0419   CL 105 02/17/2020 0419   CO2 22 02/17/2020 0419   GLUCOSE 207 (H) 02/17/2020 0419   BUN 24 (H) 02/17/2020 0419   CREATININE 1.40 (H) 02/17/2020 0419   CALCIUM 8.2 (L) 02/17/2020 0419   PROT 6.0 (L) 02/17/2020 0419   ALBUMIN 3.4 (L) 02/17/2020 0419   AST 28 02/17/2020 0419   ALT 18 02/17/2020 0419   ALKPHOS 43 02/17/2020 0419   BILITOT 0.8 02/17/2020 0419   GFRNONAA 52 (L) 02/17/2020 0419   GFRAA >60 02/17/2020 0419   Lipase  No results found for: LIPASE     Studies/Results: DG Tibia/Fibula Left  Result Date: 02/16/2020 CLINICAL DATA:  External fixation left tib fib EXAM: DG C-ARM 1-60 MIN; LEFT TIBIA AND FIBULA - 2 VIEW CONTRAST:  None FLUOROSCOPY TIME:  Fluoroscopy Time:  16 seconds Number of Acquired Spot Images: 2 COMPARISON:  02/16/2020 FINDINGS: Two low resolution intraoperative spot views  of the left tibia and fibula. Highly comminuted proximal tibial fracture again noted. IMPRESSION: Intraoperative fluoroscopic assistance provided during presumed external fixation Electronically Signed   By: Jasmine Pang M.D.   On: 02/16/2020 22:43   CT Head Wo Contrast  Result Date: 02/16/2020 CLINICAL DATA:  Trauma EXAM: CT HEAD WITHOUT CONTRAST TECHNIQUE: Contiguous axial images were obtained from the base of the skull through the vertex without intravenous contrast. COMPARISON:  None. FINDINGS: Brain: There is no acute intracranial hemorrhage, mass effect, or edema. There is a small subacute or chronic infarct of the right occipital lobe. Gray-white differentiation is otherwise preserved. There is no extra-axial fluid collection. Ventricles and sulci are within normal limits in size and configuration. Patchy hypoattenuation in the supratentorial white matter is nonspecific but may reflect mild chronic microvascular ischemic changes. Vascular: No hyperdense vessel or unexpected calcification. Skull: Calvarium is  unremarkable. Sinuses/Orbits: Minor mucosal thickening.  Orbits are unremarkable. Other: None. IMPRESSION: No evidence of acute intracranial injury. 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 Electronically Signed   By: Guadlupe Spanish M.D.   On: 02/16/2020 11:31   CT Cervical Spine Wo Contrast  Result Date: 02/16/2020 CLINICAL DATA:  Trauma. Larey Seat and landed on an [facility] unit. EXAM: CT CERVICAL SPINE WITHOUT CONTRAST TECHNIQUE: Multidetector CT imaging of the cervical spine was performed without intravenous contrast. Multiplanar CT image reconstructions were also generated. COMPARISON:  None. FINDINGS: Alignment: Mild cervical spine straightening. No listhesis. Skull base and vertebrae: Advanced left C2-3 facet arthropathy with slight facet joint widening, likely degenerative. More prominent widening of the right C5-6 facet joint with milder underlying arthropathy. Possible mild widening of the C6-7 disc space anteriorly. No acute cervical spine fracture identified. Small T1 superior endplate Schmorl's node. Soft tissues and spinal canal: No prevertebral fluid or swelling. No visible canal hematoma. Disc levels: Mild multilevel cervical disc degeneration and moderate to severe multilevel facet arthrosis. No evidence of high-grade spinal or neural foraminal stenosis. Upper chest: Clear lung apices. Other: None. IMPRESSION: 1. Right C5-6 facet joint widening which could be traumatic or degenerative with possible mild widening of the C6-7 disc space. Cervical spine MRI is recommended to evaluate for soft tissue/ligamentous injury. 2. No acute cervical spine fracture identified. Electronically Signed   By: Sebastian Ache M.D.   On: 02/16/2020 11:41   CT Knee Left Wo Contrast  Result Date: 02/16/2020 CLINICAL DATA:  Larey Seat 15 feet and injured knee. EXAM: CT OF THE left KNEE WITHOUT CONTRAST TECHNIQUE: Multidetector CT imaging of the left knee was performed according to the standard protocol. Multiplanar CT image  reconstructions were also generated. COMPARISON:  Radiographs same date. FINDINGS: Complex comminuted proximal tibia fracture. The main fracture line is a transverse fracture through the tibial metaphysis with marked comminution and posterior and mild lateral displacement. There are also oblique coursing associated tibial plateau fractures. There is significant depression and comminution involving the anterior aspect of the lateral tibial plateau. Maximum depression is approximately 8 mm. There is a nondisplaced nondepressed fracture involving the medial plateau along the medial tibial spine and out through the posterior cortex. Mildly displaced avulsion type fracture involving the fibular head. The femur and patella are intact. There is fairly marked displacement of the avulse tibial tubercle fragment. Large lipohemarthrosis is noted. The quadriceps and patellar tendons appear grossly intact. The ACL and PCL are grossly intact by CT. The medial collateral ligament complex appears to be intact. The LCL complex is difficult to evaluate. IMPRESSION: 1. Complex comminuted proximal tibia  fractures as discussed above. 2. Mildly displaced avulsion type fracture involving the fibular head. 3. Large lipohemarthrosis. Electronically Signed   By: Rudie Meyer M.D.   On: 02/16/2020 11:45   MR Cervical Spine Wo Contrast  Result Date: 02/16/2020 CLINICAL DATA:  Fell 15 feet. Back and bilateral leg pain. EXAM: MRI CERVICAL SPINE WITHOUT CONTRAST TECHNIQUE: Multiplanar, multisequence MR imaging of the cervical spine was performed. No intravenous contrast was administered. COMPARISON:  CT cervical spine, same date. FINDINGS: Examination is quite limited due to patient motion. Alignment: Normal overall alignment. Vertebrae: Normal marrow signal. No bone lesions or fractures. Cord: Grossly normal cord signal intensity. No cord lesions or cord edema. Posterior Fossa, vertebral arteries, paraspinal tissues: No significant  findings. No abnormal prevertebral soft tissue swelling or hematoma. Disc levels: No significant cervical disc protrusions. No canal compromise. No significant foraminal stenosis. As demonstrated on the CT scan there is widening of the right C5-6 facet joint but I do not see any surrounding inflammatory changes or marrow edema to suggest this is traumatic. It is most likely degenerative. IMPRESSION: 1. Limited examination due to patient motion. 2. No significant cervical disc protrusions, canal compromise or foraminal stenosis. 3. Normal MR appearance of the cervical spinal cord. 4. As noted on the CT scan there is widening of the right C5-6 facet joint but no surrounding inflammatory changes or marrow edema to suggest an acute traumatic injury. Electronically Signed   By: Rudie Meyer M.D.   On: 02/16/2020 13:49   DG Pelvis Portable  Result Date: 02/16/2020 CLINICAL DATA:  Fall. EXAM: PORTABLE PELVIS 1-2 VIEWS COMPARISON:  None. FINDINGS: There is no evidence of pelvic fracture or diastasis. No pelvic bone lesions are seen. IMPRESSION: Negative. Electronically Signed   By: Lupita Raider M.D.   On: 02/16/2020 11:01   DG Chest Portable 1 View  Result Date: 02/16/2020 CLINICAL DATA:  Fall. EXAM: PORTABLE CHEST 1 VIEW COMPARISON:  None. FINDINGS: The heart size and mediastinal contours are within normal limits. Left lung is clear. No pleural effusion is noted. Mildly displaced fracture is seen involving lateral portion of the right seventh rib. There appears to be a small right basilar pneumothorax laterally in the right lung base. IMPRESSION: Mildly displaced right seventh rib fracture. Probable small right basilar pneumothorax is noted laterally. CT scan may be performed for further evaluation. Electronically Signed   By: Lupita Raider M.D.   On: 02/16/2020 10:59   DG Knee Left Port  Result Date: 02/16/2020 CLINICAL DATA:  Postop EXAM: PORTABLE LEFT KNEE - 1-2 VIEW COMPARISON:  CT 02/16/2020,  radiograph 02/16/2020 FINDINGS: Acute highly comminuted proximal tibial fracture with involvement of the medial and lateral tibial plateaus. Anteriorly displaced tibial tuberosity without significant change. Decreased posterior displacement of main distal fracture fragment compared to previous. Acute fibular head fracture without significant displacement. Large lipohemarthrosis. IMPRESSION: Highly comminuted proximal tibial fracture with involvement of medial and lateral tibial plateau with overall decreased displacement of fracture fragments compared to previous. Acute nondisplaced fibular head fracture. Electronically Signed   By: Jasmine Pang M.D.   On: 02/16/2020 23:57   DG Tibia/Fibula Left Port  Result Date: 02/16/2020 CLINICAL DATA:  Fall with open wounds on both shins. EXAM: PORTABLE LEFT TIBIA AND FIBULA - 2 VIEW COMPARISON:  None. FINDINGS: There is a comminuted fracture of the proximal tibia which appears to extend into both the medial and lateral tibial plateaus on either side of the tibial spines. The fracture extends into the  proximal tibial shaft with multiple mildly displaced fragments. There is overlying soft tissue swelling with scattered small foci of gas consistent with the provided history of an open wound. No definite fibular fracture is identified. The knee and ankle are located. Patellar and calcaneal spurs are noted. IMPRESSION: Comminuted fracture of the proximal tibia. Electronically Signed   By: Sebastian Ache M.D.   On: 02/16/2020 11:11   DG Tibia/Fibula Right Port  Result Date: 02/16/2020 CLINICAL DATA:  Fall with open wounds on both shins. EXAM: PORTABLE RIGHT TIBIA AND FIBULA - 2 VIEW COMPARISON:  None. FINDINGS: There is soft tissue injury with swelling anterior to the mid tibial shaft. No radiopaque foreign body or acute fracture is identified. The knee and ankle are located. Nonspecific soft tissue calcifications are noted about the knee. There are superior and inferior  patellar enthesophytes, and there is mild femorotibial marginal spurring. IMPRESSION: Soft tissue swelling without acute osseous abnormality. Electronically Signed   By: Sebastian Ache M.D.   On: 02/16/2020 11:07   DG C-Arm 1-60 Min  Result Date: 02/16/2020 CLINICAL DATA:  External fixation left tib fib EXAM: DG C-ARM 1-60 MIN; LEFT TIBIA AND FIBULA - 2 VIEW CONTRAST:  None FLUOROSCOPY TIME:  Fluoroscopy Time:  16 seconds Number of Acquired Spot Images: 2 COMPARISON:  02/16/2020 FINDINGS: Two low resolution intraoperative spot views of the left tibia and fibula. Highly comminuted proximal tibial fracture again noted. IMPRESSION: Intraoperative fluoroscopic assistance provided during presumed external fixation Electronically Signed   By: Jasmine Pang M.D.   On: 02/16/2020 22:43    Anti-infectives: Anti-infectives (From admission, onward)   Start     Dose/Rate Route Frequency Ordered Stop   02/17/20 0600  ceFAZolin (ANCEF) 3 g in dextrose 5 % 50 mL IVPB  Status:  Discontinued     3 g 100 mL/hr over 30 Minutes Intravenous On call to O.R. 02/16/20 1757 02/16/20 1948   02/17/20 0400  ceFAZolin (ANCEF) IVPB 2g/100 mL premix     2 g 200 mL/hr over 30 Minutes Intravenous Every 8 hours 02/17/20 0025 02/18/20 0359   02/16/20 2000  ceFAZolin (ANCEF) 3 g in dextrose 5 % 50 mL IVPB     3 g 100 mL/hr over 30 Minutes Intravenous On call to O.R. 02/16/20 1948 02/16/20 2120       Assessment/Plan Fall from ladder, 21ft Left Tibia Fx - Per Ortho. NWB. S/p I&D, closed reduction, external fixation 4/12. PT/OT Right lower extremity laceration - Per Ortho. S/p closure in OR 4/12 Right 7th rib fx with small PTX -  AM CXR pending. Multimodal pain control. Pulm toilet, IS HTN - Per TRH DM2 - Per TRH HLD - Per TRH C-Spine - Cleared  FEN - CM  VTE - SCDs, Lovenox  ID - Ancef peri-op. None currently.    LOS: 1 day    Jacinto Halim , [facility] 02/17/2020, 8:05 AM Please see Amion  for pager number during day hours 7:00am-4:30pm

## 2020-02-17 NOTE — Progress Notes (Addendum)
Medical consult note Assesment/PLAN    AKI-stop lisinopril-saline containing potassium discontinued-placed on NS 100 cc/H Had Ct's c IV Contrast CK 414 so would not increase IV fluid rate Is + 3 liters--expect increase UoP 2-3 d DM TY 2-reasonable control--gradually resume aim for below 180 postoperatively once AKI improved Metformin 1000 twice daily-hold Amaryl, Trulicity during hospital stay Watch creatinine prior to next dose of gabapentin Polytrauma-pain control, disposition, further work-up and management per orthopedic and trauma service   DVT prophylaxis per orthopedics  Dispo: Per trauma service  S -64 WM HTN HLD DM TY 2 reflux fell off a ladder 15 feet high no LOC-trauma admitted the patient and requested medical comanagement  TODAY Awake pleasant alert Pain is much better controlled than yesterday No fever no chills Long discussion with me about his wife's illnesses and life in [place]   O BP 134/88 (BP Location: Left Arm)   Pulse 85   Temp 98.4 F (36.9 C) (Oral)   Resp 18   Ht 5\' 11"  (1.803 m)   Wt 120.2 kg   SpO2 99%   BMI 36.96 kg/m   Awake pleasant thick beard no icterus no pallor cannot appreciate JVD Mallampati 2 Chest clear no added sound no rales no rhonchi Abdomen soft nontender External fixators on both lower extremities, wiggling toes without issue and no pain Neurologically power is intact but limited by pain Labs/studies reviewed today  Potassium 5, BUN/creatinine 18/1.07-->24/1.4                        \

## 2020-02-17 NOTE — Progress Notes (Signed)
Assisted with CT placement.  Patient tolerated well, placed on 2L [place] during procedure.

## 2020-02-17 NOTE — Evaluation (Addendum)
Occupational Therapy Evaluation Patient Details Name: Cameron Velasquez MRN: [id number] DOB: 1956/02/05 Today's Date: 02/17/2020    History of Present Illness [name] is an 64 y.o. male hx of DM and HTN.  Admitted after a fall from ladder, 66ft, resulting in left tibia fx, right 7th rib fx, right LE laceration. S/p I&D, closed reduction, external fixation. s/p chest tube 4/13.   Clinical Impression   PTA, pt was living at home with his wife, pt is the caregiver for his wife, pt reports he was independent with ADL/IADL and functional mobiltiy. Pt reports his children will be assisting pt and his wife when pt discharges home. Pt currently requires minA+2 to progress to EOB, modA+2 for sit<>stand. He tolerated standing for about 1 min, HR up to 123bpm. Pt reports pain in right ribs that is worse since chest tube placement which limits his use of his RUE during mobility. Due to decline in current level of function, pt would benefit from acute OT to address established goals to facilitate safe D/C to venue listed below. At this time, recommend CIR follow-up. Will continue to follow acutely.     Follow Up Recommendations  CIR;Supervision/Assistance - 24 hour    Equipment Recommendations  3 in 1 bedside commode    Recommendations for Other Services       Precautions / Restrictions Precautions Precautions: Fall Precaution Comments: chest tube, external fixation LLE Restrictions Weight Bearing Restrictions: Yes LLE Weight Bearing: Non weight bearing      Mobility Bed Mobility Overal bed mobility: Needs Assistance Bed Mobility: Supine to Sit;Sit to Supine     Supine to sit: Min assist;+2 for physical assistance;+2 for safety/equipment;HOB elevated Sit to supine: Min assist;+2 for physical assistance;+2 for safety/equipment;HOB elevated   General bed mobility comments: minA for LLE management and progressing trunk to upright position due to increased pain in right ribs with use of  RUE, pt squeezed pillow during movement  Transfers Overall transfer level: Needs assistance Equipment used: Rolling walker (2 wheeled) Transfers: Sit to/from Stand Sit to Stand: +2 physical assistance;+2 safety/equipment;From elevated surface;Mod assist         General transfer comment: modA to progress into standing from elevated surface    Balance                                           ADL either performed or assessed with clinical judgement   ADL Overall ADL's : Needs assistance/impaired Eating/Feeding: Set up;Sitting   Grooming: Min guard;Sitting   Upper Body Bathing: Minimal assistance;Sitting   Lower Body Bathing: Moderate assistance;Sit to/from stand   Upper Body Dressing : Minimal assistance;Sitting   Lower Body Dressing: Moderate assistance;Sit to/from stand   Toilet Transfer: Moderate assistance Toilet Transfer Details (indicate cue type and reason): modA to stand from EOB Toileting- Clothing Manipulation and Hygiene: Moderate assistance Toileting - Clothing Manipulation Details (indicate cue type and reason): pt's saturated in urine, modA for thorough pericare     Functional mobility during ADLs: +2 for safety/equipment;Rolling walker;Minimal assistance General ADL Comments: pt limited by pain, decreased activity tolerance, elevated HR up to 123 with standing     Vision Baseline Vision/History: Wears glasses Wears Glasses: At all times Patient Visual Report: No change from baseline Vision Assessment?: No apparent visual deficits     Perception     Praxis      Pertinent Vitals/Pain Pain Assessment:  0-10 Pain Score: 5  Pain Location: chest, exaccerbated by breathing Pain Descriptors / Indicators: Sore Pain Intervention(s): Limited activity within patient's tolerance;Monitored during session     Hand Dominance Right   Extremity/Trunk Assessment Upper Extremity Assessment Upper Extremity Assessment: RUE deficits/detail RUE  Deficits / Details: limited use due to exaccerbated pain in ribs with movement  RUE: Unable to fully assess due to pain   Lower Extremity Assessment Lower Extremity Assessment: RLE deficits/detail;LLE deficits/detail RLE Deficits / Details: WFL pt reports no pain with weight bearing LLE Deficits / Details: external fixation, limited assessment due to immobilization;able to wiggle toes and plantarflex/dorsiflex foot   Cervical / Trunk Assessment Cervical / Trunk Assessment: Normal   Communication Communication Communication: No difficulties   Cognition Arousal/Alertness: Awake/alert Behavior During Therapy: WFL for tasks assessed/performed Overall Cognitive Status: Within Functional Limits for tasks assessed                                     General Comments  SpO2 WNL 2lnc, HR up to 123 with standing     Exercises Exercises: Other exercises Other Exercises Other Exercises: educated pt on sqeezing a pillow when coughing/sneezing to reduce pain in right ribs   Shoulder Instructions      Home Living Family/patient expects to be discharged to:: Private residence Living Arrangements: Spouse/significant other Available Help at Discharge: Family Type of Home: House Home Access: Stairs to enter Secretary/administrator of Steps: 2 Entrance Stairs-Rails: None Home Layout: One level     Bathroom Shower/Tub: Producer, television/film/video: Standard Bathroom Accessibility: Yes How Accessible: Accessible via walker Home Equipment: Shower seat;Grab bars - tub/shower;Grab bars - toilet;Walker - 2 wheels;Walker - 4 wheels   Additional Comments: pt's daughters coming in from out of town, son and daughter-in-law live 5 min away      Prior Functioning/Environment Level of Independence: Independent        Comments: 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        OT Problem List: Decreased activity tolerance;Impaired balance (sitting and/or standing);Decreased safety  awareness;Decreased knowledge of precautions;Cardiopulmonary status limiting activity;Obesity;Pain      OT Treatment/Interventions: Self-care/ADL training;Therapeutic exercise;Energy conservation;DME and/or AE instruction;Therapeutic activities;Patient/family education;Balance training    OT Goals(Current goals can be found in the care plan section) Acute Rehab OT Goals Patient Stated Goal: to go home to his wife OT Goal Formulation: With patient Time For Goal Achievement: 03/02/20 Potential to Achieve Goals: Good ADL Goals Pt Will Perform Grooming: standing;with supervision Pt Will Perform Lower Body Dressing: with adaptive equipment;sit to/from stand;with set-up Pt Will Transfer to Toilet: ambulating;with min guard assist Pt Will Perform Toileting - Clothing Manipulation and hygiene: sit to/from stand;with supervision Additional ADL Goal #1: Pt will progress to EOB with modified independence in preparation for ADL.  OT Frequency: Min 2X/week   Barriers to D/C: Decreased caregiver support  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       Co-evaluation PT/OT/SLP Co-Evaluation/Treatment: Yes Reason for Co-Treatment: Complexity of the patient's impairments (multi-system involvement);For patient/therapist safety;To address functional/ADL transfers   OT goals addressed during session: ADL's and self-care      AM-PAC OT "6 Clicks" Daily Activity     Outcome Measure Help from another person eating meals?: A Little Help from another person taking care of personal grooming?: A Little Help from another person toileting, which includes using toliet, bedpan, or urinal?: A Lot Help from another  person bathing (including washing, rinsing, drying)?: A Lot Help from another person to put on and taking off regular upper body clothing?: A Little Help from another person to put on and taking off regular lower body clothing?: A Lot 6 Click Score: 15   End of Session Equipment Utilized During Treatment: Rolling  walker Nurse Communication: Mobility status  Activity Tolerance: Patient tolerated treatment well Patient left: in bed;with call bell/phone within reach;with bed alarm set  OT Visit Diagnosis: Unsteadiness on feet (R26.81);Other abnormalities of gait and mobility (R26.89);Pain Pain - Right/Left: Right Pain - part of body: (ribs)                Time: 1601-0932 OT Time Calculation (min): 33 min Charges:  OT General Charges $OT Visit: 1 Visit OT Evaluation $OT Eval Moderate Complexity: [place] OTR/L Acute Rehabilitation Services Office: [place] 02/17/2020, 4:29 PM

## 2020-02-17 NOTE — Progress Notes (Addendum)
Orthopaedic Trauma Service Progress Note  Patient ID: Cameron Velasquez MRN: [id number] DOB/AGE: 05-06-1956 64 y.o.  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 Works from home   ROS As above  Objective:   VITALS:   Vitals:   02/17/20 0935 02/17/20 1258 02/17/20 1320 02/17/20 1356  BP:  (!) 149/76 (!) 144/84   Pulse:  88 93   Resp:   18   Temp:   98.3 F (36.8 C)   TempSrc:   Oral   SpO2: 97%  98% 99%  Weight:      Height:        Estimated body mass index is 36.96 kg/m as calculated from the following:   Height as of this encounter: 5\' 11"  (1.803 m).   Weight as of this encounter: 120.2 kg.   Intake/Output      04/12 0701 - 04/13 0700 04/13 0701 - 04/14 0700   P.O.  240   I.V. (mL/kg) 1500 (12.5) 622.6 (5.2)   IV Piggyback 50 100   Total Intake(mL/kg) 1550 (12.9) 962.6 (8)   Urine (mL/kg/hr) 300 200 (0.2)   Stool  0   Total Output 300 200   Net +1250 +762.6        Urine Occurrence  2 x     LABS  Results for orders placed or performed during the hospital encounter of 02/16/20 (from the past 24 hour(s))  Hemoglobin A1c     Status: None   Collection Time: 02/16/20  2:03 PM  Result Value Ref Range   Hgb A1c MFr Bld 5.6 4.8 - 5.6 %   Mean Plasma Glucose 114 mg/dL  Surgical pcr screen     Status: None   Collection Time: 02/16/20  3:30 PM   Specimen: Nasal Mucosa; Nasal Swab  Result Value Ref Range   MRSA, PCR NEGATIVE NEGATIVE   Staphylococcus aureus NEGATIVE NEGATIVE  Lactic acid, plasma     Status: Abnormal   Collection Time: 02/16/20  3:40 PM  Result Value Ref Range   Lactic Acid, Venous 2.8 (HH) 0.5 - 1.9 mmol/L  Glucose, capillary     Status: Abnormal   Collection Time: 02/16/20  5:42 PM  Result Value Ref Range   Glucose-Capillary 212 (H) 70 - 99  mg/dL  Glucose, capillary     Status: Abnormal   Collection Time: 02/16/20  7:49 PM  Result Value Ref Range   Glucose-Capillary 205 (H) 70 - 99 mg/dL  Glucose, capillary     Status: Abnormal   Collection Time: 02/16/20 11:13 PM  Result Value Ref Range   Glucose-Capillary 229 (H) 70 - 99 mg/dL  VITAMIN D 25 Hydroxy (Vit-D Deficiency, Fractures)     Status: None   Collection Time: 02/17/20  4:19 AM  Result Value Ref Range   Vit D, 25-Hydroxy 67.75 30 - 100 ng/mL  CBC     Status: Abnormal   Collection Time: 02/17/20  4:19 AM  Result Value Ref Range   WBC 15.1 (H) 4.0 - 10.5 K/uL   RBC 3.86 (L) 4.22 - 5.81 MIL/uL   Hemoglobin 11.7 (L) 13.0 - 17.0 g/dL   HCT 02/19/20 (L)  39.0 - 52.0 %   MCV 91.2 80.0 - 100.0 fL   MCH 30.3 26.0 - 34.0 pg   MCHC 33.2 30.0 - 36.0 g/dL   RDW 13.2 11.5 - 15.5 %   Platelets 197 150 - 400 K/uL   nRBC 0.0 0.0 - 0.2 %  Comprehensive metabolic panel     Status: Abnormal   Collection Time: 02/17/20  4:19 AM  Result Value Ref Range   Sodium 139 135 - 145 mmol/L   Potassium 5.0 3.5 - 5.1 mmol/L   Chloride 105 98 - 111 mmol/L   CO2 22 22 - 32 mmol/L   Glucose, Bld 207 (H) 70 - 99 mg/dL   BUN 24 (H) 8 - 23 mg/dL   Creatinine, Ser 1.40 (H) 0.61 - 1.24 mg/dL   Calcium 8.2 (L) 8.9 - 10.3 mg/dL   Total Protein 6.0 (L) 6.5 - 8.1 g/dL   Albumin 3.4 (L) 3.5 - 5.0 g/dL   AST 28 15 - 41 U/L   ALT 18 0 - 44 U/L   Alkaline Phosphatase 43 38 - 126 U/L   Total Bilirubin 0.8 0.3 - 1.2 mg/dL   GFR calc non Af Amer 52 (L) >60 mL/min   GFR calc Af Amer >60 >60 mL/min   Anion gap 12 5 - 15  Glucose, capillary     Status: Abnormal   Collection Time: 02/17/20  6:29 AM  Result Value Ref Range   Glucose-Capillary 182 (H) 70 - 99 mg/dL  CK     Status: Abnormal   Collection Time: 02/17/20  9:47 AM  Result Value Ref Range   Total CK 414 (H) 49 - 397 U/L  Glucose, capillary     Status: Abnormal   Collection Time: 02/17/20 11:48 AM  Result Value Ref Range   Glucose-Capillary  152 (H) 70 - 99 mg/dL     PHYSICAL EXAM:   Gen: resting comfortably in bed, NAD  Ext:       Left Lower Extremity   prevena functioning well  Distal motor and sensory functions intact  Ext warm   + DP pulse  No pain with passive stretch        Right Lower Extremity   Ex fix stable  pinsites look good   Dressings c/d/i  Ext warm   Swelling controlled  Distal motor and sensory functions intact   No pain with passive stretch   Assessment/Plan: 1 Day Post-Op   Principal Problem:   Left tibial fracture Active Problems:   Leukocytosis   Essential hypertension   Diabetes mellitus type 2, controlled ([place])   Fall from ladder   Anti-infectives (From admission, onward)   Start     Dose/Rate Route Frequency Ordered Stop   02/17/20 0600  ceFAZolin (ANCEF) 3 g in dextrose 5 % 50 mL IVPB  Status:  Discontinued     3 g 100 mL/hr over 30 Minutes Intravenous On call to O.R. 02/16/20 1757 02/16/20 1948   02/17/20 0400  ceFAZolin (ANCEF) IVPB 2g/100 mL premix     2 g 200 mL/hr over 30 Minutes Intravenous Every 8 hours 02/17/20 0025 02/18/20 0359   02/16/20 2000  ceFAZolin (ANCEF) 3 g in dextrose 5 % 50 mL IVPB     3 g 100 mL/hr over 30 Minutes Intravenous On call to O.R. 02/16/20 1948 02/16/20 2120    .  POD/HD#: 1  64 y/o male s/p fall off ladder >15 ft   -fall off ladder  - L  bicondylar tibial plateau fracture s/p Ex fix   NWB L leg   Aggressive Ice and elevation   Heel cord stretching   Therapy evals   Plan for OR Monday or Tuesday for definitive fixation   Start pin care on Thursday    - traumatic wound L leg s/p I&D and closure   Dressing change Thursday  - traumatic wound R leg s/p I&D and closure  prevena in place   Will remove next week in OR   WBAT R Leg  No ROM restrictions R hip, knee or ankle   - R PTX  Per TS  Chest tube today   - Pain management:  Continue with current regimen   - ABL anemia/Hemodynamics  Stable  Monitor   - Medical issues     Per medical team     AKI    Monitor    IVF    Multifactorial including significant MSK trauma, IV contrast administration for workup   Follow up on lactic acid and CK to ensure they are trending down    Continue to hold metformin     - DVT/PE prophylaxis:  lovenox  - ID:   abx for another 24 hours   - Metabolic Bone Disease:  Vitamin d levels look good  - Activity:  WBAT R leg  NWB L leg  - FEN/GI prophylaxis/Foley/Lines:  Carb mod diet  IVF  -Ex-fix/Splint care:  Ok to manipulate L leg by ex fix   - Impediments to fracture healing:  Severe soft tissue injury   High energy injury   DM  - Dispo:  Continue with inpatient care   OR early next week for definitive fixation     Mearl Latin, PA-C [phone] (C) 02/17/2020, 1:57 PM  [facility] [address] [place] [address] [phone] Collier Bullock (F)

## 2020-02-17 NOTE — Social Work (Signed)
CSW met with pt at bedside. CSW introduced self and explained her role. CSW completed sbirt with pt.  Pt scored a 0 on the sbirt scale. Pt denied alcohol use. Pt stated he does smoke occasional marijuana to help with his arthritis pain. Pt stated he may use it a couple times a month and only uses "a couple of puffs". Pt stated he has no intentions of stopping use and explained how he hopes medical marijuana will soon be legal in [place].   CSW and pt discussed the effects of marijuana use on pts health. CSW offered pt resources to stop se. Pt stated he was uninterested in stopping.   Emeterio Reeve, Latanya Presser, [place] Social Worker [phone]

## 2020-02-17 NOTE — Procedures (Signed)
Preop dx: right ptx s/p fall Postop dx: saa Procedure: Insertion pigtail tube thoracostomy right chest [facility] Harden Mo MD EBL minimal Complications none  Indications: Cameron Velasquez has a worsening right ptx after a fall. We counselled him for right chest tube placement.  Procedure: After informed consent obtained he was given 50 mcg of fentanyl and 2 mg of versed.  He was prepped and draped in standard sterile fashion.    His right chest was infiltrated with lidocaine. An incision was made.  The needle was then inserted into the right chest without difficulty with return of air.  Wire placed.  Tract dilated and pigtail placed. This was secured, hooked to suction and drssing placed. He tolerated well. cxr pending.  Bailey Mech performed procedure under my direct supervision.

## 2020-02-17 NOTE — Progress Notes (Signed)
02/17/20 1745  PT Evaluation Information  Last PT Received On 02/17/20  Assistance Needed +2 (+2 for mobility progression)  PT/OT/SLP Co-Evaluation/Treatment Yes  Reason for Co-Treatment Complexity of the patient's impairments (multi-system involvement);For patient/therapist safety;To address functional/ADL transfers  PT goals addressed during session Mobility/safety with mobility;Proper use of DME;Balance  History of Present Illness Cameron Velasquez is an 64 y.o. male hx of DM and HTN.  Admitted after a fall from ladder, 21ft, resulting in left tibia fx, right 7th rib fx, right LE laceration. S/p I&D, closed reduction, external fixation of LLE. s/p I and D of RLE laceration. Pt with R pneumothorax and is s/p chest tube insertion.    Precautions  Precautions Fall  Precaution Comments chest tube, external fixation LLE  Restrictions  Weight Bearing Restrictions Yes  RLE Weight Bearing WBAT  LLE Weight Bearing NWB  Home Living  Family/patient expects to be discharged to: Private residence  Living Arrangements Spouse/significant other  Available Help at Discharge Family  Type of [place] to enter  Entrance Stairs-Number of Steps 2  Entrance Stairs-Rails None  Home Layout One level  Engineer, manufacturing systems Yes  Home Equipment Shower seat;Grab bars - tub/shower;Grab bars - toilet;Walker - 2 wheels;Walker - 4 wheels  Additional Comments pt's daughters coming in from out of town, son and daughter-in-law live 5 min away  Prior Function  Level of Independence Independent  Comments 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  Communication  Communication No difficulties  Pain Assessment  Pain Assessment 0-10  Pain Score 5  Pain Location chest, exaccerbated by breathing  Pain Descriptors / Indicators Sore  Pain Intervention(s) Limited activity within patient's tolerance;Monitored during session;Repositioned  Cognition   Arousal/Alertness Awake/alert  Behavior During Therapy WFL for tasks assessed/performed  Overall Cognitive Status Within Functional Limits for tasks assessed  Upper Extremity Assessment  Upper Extremity Assessment Defer to OT evaluation  Lower Extremity Assessment  Lower Extremity Assessment RLE deficits/detail;LLE deficits/detail  RLE Deficits / Details pt reports no pain with weight bearing. able to perform SLR  LLE Deficits / Details external fixation, limited assessment due to immobilization;able to wiggle toes and plantarflex/dorsiflex foot  Cervical / Trunk Assessment  Cervical / Trunk Assessment Normal  Bed Mobility  Overal bed mobility Needs Assistance  Bed Mobility Supine to Sit;Sit to Supine  Supine to sit Min assist;+2 for physical assistance;+2 for safety/equipment;HOB elevated  Sit to supine Min assist;+2 for physical assistance;+2 for safety/equipment;HOB elevated  General bed mobility comments minA +2 for LLE management and progressing trunk to upright position due to increased pain in right ribs with use of RUE, pt squeezed pillow during movement  Transfers  Overall transfer level Needs assistance  Equipment used Rolling walker (2 wheeled)  Transfers Sit to/from Stand  Sit to Stand +2 physical assistance;+2 safety/equipment;From elevated surface;Mod assist  General transfer comment modA +2 to progress into standing from elevated surface. Cues to maintain NWB on LLE.   General Comments  General comments (skin integrity, edema, etc.) SpO2 WNL 2lnc, HR up to 123 with standing   Exercises  Exercises Other exercises;General Lower Extremity  General Exercises - Lower Extremity  Ankle Circles/Pumps AROM;Left;5 reps;Supine  Other Exercises  Other Exercises educated pt on sqeezing a pillow when coughing/sneezing to reduce pain in right ribs  PT - End of Session  Equipment Utilized During Treatment Oxygen  Activity Tolerance Patient tolerated treatment well  Patient left in  bed;with call  bell/phone within reach  Nurse Communication Mobility status  PT Assessment  PT Recommendation/Assessment Patient needs continued PT services  PT Visit Diagnosis Unsteadiness on feet (R26.81);History of falling (Z91.81);Difficulty in walking, not elsewhere classified (R26.2)  PT Problem List Decreased strength;Decreased balance;Decreased mobility;Decreased knowledge of precautions;Pain  PT Plan  PT Frequency (ACUTE ONLY) Min 5X/week  PT Treatment/Interventions (ACUTE ONLY) Gait training;DME instruction;Therapeutic exercise;Stair training;Functional mobility training;Therapeutic activities;Balance training;Patient/family education  AM-PAC PT "6 Clicks" Mobility Outcome Measure (Version 2)  Help needed turning from your back to your side while in a flat bed without using bedrails? 3  Help needed moving from lying on your back to sitting on the side of a flat bed without using bedrails? 3  Help needed moving to and from a bed to a chair (including a wheelchair)? 2  Help needed standing up from a chair using your arms (e.g., wheelchair or bedside chair)? 2  Help needed to walk in hospital room? 1  Help needed climbing 3-5 steps with a railing?  1  6 Click Score 12  Consider Recommendation of Discharge To: CIR/SNF/LTACH  PT Recommendation  Recommendations for Other Services Rehab consult  Follow Up Recommendations CIR  PT equipment Other (comment) (TBD)  Individuals Consulted  Consulted and Agree with Results and Recommendations Patient  Acute Rehab PT Goals  Patient Stated Goal to go home to his wife  PT Goal Formulation With patient  Time For Goal Achievement 03/02/20  Potential to Achieve Goals Good  PT Time Calculation  PT Start Time (ACUTE ONLY) 1528  PT Stop Time (ACUTE ONLY) 1601  PT Time Calculation (min) (ACUTE ONLY) 33 min  PT General Charges  $$ ACUTE PT VISIT 1 Visit  PT Evaluation  $PT Eval Moderate Complexity 1 Mod  Written Expression  Dominant Hand Right     Pt admitted secondary to problem above with deficits above. Pt reporting pain in R ribs which limited use of RUE during mobility. Pt also complaining of pain in LLE, however, did not seem to limit mobility tolerance. Required mod A +2 using RW to stand at EOB this session. He tolerated standing for about 1 min, HR up to 123bpm. Educated about performing ankle pumps and using pillow to brace abdomen for pain control. Feel pt would be excellent candidate for CIR given current mobility limitations. Pt very motivated to regain independence. Will continue to follow acutely to maximize functional mobility independence and safety.   Farley Ly, PT, DPT  Acute Rehabilitation Services  Pager: [phone] Office: [phone]

## 2020-02-17 NOTE — Plan of Care (Signed)

## 2020-02-17 NOTE — Progress Notes (Signed)
Rehab Admissions Coordinator Note:  Per OT recommendation, this patient was screened by Cheri Rous for appropriateness for an Inpatient Acute Rehab Consult.  At this time, we are recommending Inpatient Rehab consult. Please have attending MD place consult order if they would like for this patient to be considered for CIR.   Cheri Rous 02/17/2020, 4:50 PM  I can be reached at [phone].

## 2020-02-17 NOTE — Progress Notes (Signed)
Patient ID: Cameron Velasquez, male   DOB: 09/27/1956, 64 y.o.   MRN: [id number] Vitals normal, chest ct with large ptx on right side Needs right chest tube Discussed pigtail placement at bedside with patient.

## 2020-02-17 NOTE — Progress Notes (Signed)
OT Cancellation Note  Patient Details Name: Cameron Velasquez MRN: [id number] DOB: Jun 16, 1956   Cancelled Treatment:    Reason Eval/Treat Not Completed: Patient at procedure or test/ unavailable Pt transporting to CT. OT will return later as time allows and pt is appropriate.   [facility] OTR/L Acute Rehabilitation Services Office: [phone]   Rebeca Alert 02/17/2020, 11:17 AM

## 2020-02-18 ENCOUNTER — Inpatient Hospital Stay (HOSPITAL_COMMUNITY): Payer: Medicare Other

## 2020-02-18 DIAGNOSIS — J930 Spontaneous tension pneumothorax: Secondary | ICD-10-CM

## 2020-02-18 LAB — RENAL FUNCTION PANEL
Albumin: 3.2 g/dL — ABNORMAL LOW (ref 3.5–5.0)
Anion gap: 8 (ref 5–15)
BUN: 17 mg/dL (ref 8–23)
CO2: 25 mmol/L (ref 22–32)
Calcium: 8.2 mg/dL — ABNORMAL LOW (ref 8.9–10.3)
Chloride: 105 mmol/L (ref 98–111)
Creatinine, Ser: 1.02 mg/dL (ref 0.61–1.24)
GFR calc Af Amer: >60 mL/min (ref >60)
GFR calc non Af Amer: >60 mL/min (ref >60)
Glucose, Bld: 185 mg/dL — ABNORMAL HIGH (ref 70–99)
Phosphorus: 3.7 mg/dL (ref 2.5–4.6)
Potassium: 4.1 mmol/L (ref 3.5–5.1)
Sodium: 138 mmol/L (ref 135–145)

## 2020-02-18 LAB — LACTIC ACID, PLASMA: Lactic Acid, Venous: 1 mmol/L (ref 0.5–1.9)

## 2020-02-18 LAB — CBC
HCT: 29 % — ABNORMAL LOW (ref 39.0–52.0)
Hemoglobin: 9.8 g/dL — ABNORMAL LOW (ref 13.0–17.0)
MCH: 30.7 pg (ref 26.0–34.0)
MCHC: 33.8 g/dL (ref 30.0–36.0)
MCV: 90.9 fL (ref 80.0–100.0)
Platelets: 153 10*3/uL (ref 150–400)
RBC: 3.19 MIL/uL — ABNORMAL LOW (ref 4.22–5.81)
RDW: 13.2 % (ref 11.5–15.5)
WBC: 11.4 10*3/uL — ABNORMAL HIGH (ref 4.0–10.5)
nRBC: 0 % (ref 0.0–0.2)

## 2020-02-18 LAB — GLUCOSE, CAPILLARY
Glucose-Capillary: 189 mg/dL — ABNORMAL HIGH (ref 70–99)
Glucose-Capillary: 227 mg/dL — ABNORMAL HIGH (ref 70–99)
Glucose-Capillary: 154 mg/dL — ABNORMAL HIGH (ref 70–99)

## 2020-02-18 LAB — CK: Total CK: 504 U/L — ABNORMAL HIGH (ref 49–397)

## 2020-02-18 MED ORDER — HYDRALAZINE HCL 25 MG PO TABS
25.0000 mg | ORAL_TABLET | Freq: Three times a day (TID) | ORAL | Status: DC | PRN
  Administered 2020-02-18: 25 mg via ORAL
  Filled 2020-02-18: qty 1

## 2020-02-18 NOTE — Progress Notes (Signed)
PROGRESS NOTE    Cameron Velasquez  YIR:[id number] DOB: 01-20-1956 DOA: 02/16/2020 PCP: Laurel Dimmer, FNP    Brief Narrative:  Cameron Velasquez is a 64 year old Caucasian male with past medical history remarkable for type 2 diabetes mellitus, essential hypertension, hyperlipidemia, GERD who was admitted to the trauma service following fall off of a ladder, roughly 15 feet high without loss of consciousness suffering multiple fractures and right-sided pneumothorax.  Trauma service requested Hospitalist service assist with medical comanagement.   Assessment & Plan:   Principal Problem:   Left tibial fracture Active Problems:   Leukocytosis   Essential hypertension   Diabetes mellitus type 2, controlled (HCC)   Fall from ladder   Acute renal failure: Resolved Creatinine peaked at 1.40 on 02/17/2020.  Suspect dehydration/prerenal azotemia.  Resolved with IV fluid hydration. --Cr 1.07-->0.90-->1.40-->1.02 --Discontinue IV fluids today --Repeat BMP in a.m.  Type 2 diabetes mellitus Hemoglobin A1c 5.6, well controlled.  At home on Trulicity 1.5 mg [place] q Sunday, glimepiride 4 mg p.o. daily, Metformin 1000 mg twice daily. --Holding oral hypoglycemics and Trulicity while inpatient --Insulin sliding scale for coverage while inpatient --CBGs 4 times daily qAC/HS  Essential hypertension BP 131/81 this morning, controlled. --Currently holding lisinopril for AKI as above --Hydralazine 25 mg p.o. every 8 hours prn for SBP >170 or DBP >110 --Continue aspirin and statin  HLD: Continue Crestor 10 mg p.o. daily  Right seventh rib fracture with tension pneumothorax Underwent right pigtail thoracostomy by Dr. Dwain Sarna on 02/17/2020.  Repeat chest x-ray this morning with right-sided chest tube in place without pneumothorax. --Continues on suction --Repeat CXR in a.m. --Incentive spirometry --Management per trauma surgery  Left bicondylar tibial plateau fracture Underwent  external fixation, nonweightbearing left lower extremity.  Orthopedics plans for definitive fixation Monday or Tuesday.  Traumatic wounds right/left legs Underwent I&D with closure by orthopedics.  Further management per Ortho. --Pain control with Tylenol, tramadol, morphine, Robaxin   DVT prophylaxis: Lovenox Code Status: Full code Family Communication:  Disposition Plan: Per primary, trauma service       Procedures:   Right pigtail tube thoracostomy - Dr. Dwain Sarna 02/17/2020  Antimicrobials:   Perioperative cefazolin 4/12 - 4/13  Irrigation debridement open fracture with external fixation 4/12 - Dr. Carola Frost   Subjective: Patient seen and examined at bedside, resting comfortably.  States pain is controlled.  No specific complaints at this morning.  Denies headache, no fever/chills/night sweats, no nausea/vomiting/diarrhea, no chest pain, no shortness of breath, no abdominal pain, no fatigue, no paresthesias.  No acute events overnight per nursing staff.  Objective: Vitals:   02/17/20 1834 02/17/20 1908 02/18/20 0326 02/18/20 0841  BP:  (!) 141/72 131/81 (!) 173/86  Pulse:  93 98 87  Resp:  19 17 17   Temp:  98.6 F (37 C) 98.2 F (36.8 C) 98.5 F (36.9 C)  TempSrc:  Oral Oral Oral  SpO2: 98% 98% 96% 99%  Weight:      Height:        Intake/Output Summary (Last 24 hours) at 02/18/2020 1400 Last data filed at 02/18/2020 1100 Gross per 24 hour  Intake 1389.4 ml  Output 2101 ml  Net -711.6 ml   Filed Weights   02/16/20 1029  Weight: 120.2 kg    Examination:  General exam: Appears calm and comfortable  Respiratory system: Clear to auscultation. Respiratory effort normal. Cardiovascular system: S1 & S2 heard, RRR. No JVD, murmurs, rubs, gallops or clicks. No pedal edema. Gastrointestinal system: Abdomen is nondistended, soft and  nontender. No organomegaly or masses felt. Normal bowel sounds heard. Central nervous system: Alert and oriented. No focal neurological  deficits. Extremities: Symmetric 5 x 5 power. Skin: No rashes, lesions or ulcers Psychiatry: Judgement and insight appear normal. Mood & affect appropriate.     Data Reviewed: I have personally reviewed following labs and imaging studies  CBC: Recent Labs  Lab 02/16/20 1024 02/16/20 1038 02/17/20 0419 02/18/20 0249  WBC 10.6*  --  15.1* 11.4*  HGB 14.8 14.3 11.7* 9.8*  HCT 44.2 42.0 35.2* 29.0*  MCV 90.4  --  91.2 90.9  PLT 177  --  197 409   Basic Metabolic Panel: Recent Labs  Lab 02/16/20 1024 02/16/20 1038 02/17/20 0419 02/18/20 0249  NA 137 137 139 138  K 5.1 4.8 5.0 4.1  CL 104 105 105 105  CO2 18*  --  22 25  GLUCOSE 155* 152* 207* 185*  BUN 18 22 24* 17  CREATININE 1.07 0.90 1.40* 1.02  CALCIUM 9.3  --  8.2* 8.2*  PHOS  --   --   --  3.7   GFR: Estimated Creatinine Clearance: 95.3 mL/min (by C-G formula based on SCr of 1.02 mg/dL). Liver Function Tests: Recent Labs  Lab 02/16/20 1024 02/17/20 0419 02/18/20 0249  AST 37 28  --   ALT 21 18  --   ALKPHOS 56 43  --   BILITOT 1.2 0.8  --   PROT 6.7 6.0*  --   ALBUMIN 4.2 3.4* 3.2*   No results for input(s): LIPASE, AMYLASE in the last 168 hours. No results for input(s): AMMONIA in the last 168 hours. Coagulation Profile: Recent Labs  Lab 02/16/20 1119  INR 1.1   Cardiac Enzymes: Recent Labs  Lab 02/17/20 0947 02/18/20 0249  CKTOTAL 414* 504*   BNP (last 3 results) No results for input(s): PROBNP in the last 8760 hours. HbA1C: Recent Labs    02/16/20 1403  HGBA1C 5.6   CBG: Recent Labs  Lab 02/17/20 0629 02/17/20 1148 02/17/20 1658 02/17/20 2039 02/18/20 1219  GLUCAP 182* 152* 166* 176* 189*   Lipid Profile: No results for input(s): CHOL, HDL, LDLCALC, TRIG, CHOLHDL, LDLDIRECT in the last 72 hours. Thyroid Function Tests: No results for input(s): TSH, T4TOTAL, FREET4, T3FREE, THYROIDAB in the last 72 hours. Anemia Panel: No results for input(s): VITAMINB12, FOLATE, FERRITIN,  TIBC, IRON, RETICCTPCT in the last 72 hours. Sepsis Labs: Recent Labs  Lab 02/16/20 1024 02/16/20 1234 02/16/20 1540 02/18/20 0249  LATICACIDVEN 3.5* 2.3* 2.8* 1.0    Recent Results (from the past 240 hour(s))  Respiratory Panel by RT PCR (Flu A&B, Covid) - Nasopharyngeal Swab     Status: None   Collection Time: 02/16/20 10:48 AM   Specimen: Nasopharyngeal Swab  Result Value Ref Range Status   SARS Coronavirus 2 by RT PCR NEGATIVE NEGATIVE Final    Comment: (NOTE) SARS-CoV-2 target nucleic acids are NOT DETECTED. The SARS-CoV-2 RNA is generally detectable in upper respiratoy specimens during the acute phase of infection. The lowest concentration of SARS-CoV-2 viral copies this assay can detect is 131 copies/mL. A negative result does not preclude SARS-Cov-2 infection and should not be used as the sole basis for treatment or other patient management decisions. A negative result may occur with  improper specimen collection/handling, submission of specimen other than nasopharyngeal swab, presence of viral mutation(s) within the areas targeted by this assay, and inadequate number of viral copies (<131 copies/mL). A negative result must be combined with  clinical observations, patient history, and epidemiological information. The expected result is Negative. Fact Sheet for Patients:  https://www.moore.com/https://www.fda.gov/media/142436/download Fact Sheet for Healthcare Providers:  https://www.young.biz/https://www.fda.gov/media/142435/download This test is not yet ap proved or cleared by the [place] FDA and  has been authorized for detection and/or diagnosis of SARS-CoV-2 by FDA under an Emergency Use Authorization (EUA). This EUA will remain  in effect (meaning this test can be used) for the duration of the COVID-19 declaration under Section 564(b)(1) of the Act, 21 U.S.C. section 360bbb-3(b)(1), unless the authorization is terminated or revoked sooner.    Influenza A by PCR NEGATIVE NEGATIVE Final   Influenza B  by PCR NEGATIVE NEGATIVE Final    Comment: (NOTE) The Xpert Xpress SARS-CoV-2/FLU/RSV assay is intended as an aid in  the diagnosis of influenza from Nasopharyngeal swab specimens and  should not be used as a sole basis for treatment. Nasal washings and  aspirates are unacceptable for Xpert Xpress SARS-CoV-2/FLU/RSV  testing. Fact Sheet for Patients: https://www.moore.com/https://www.fda.gov/media/142436/download Fact Sheet for Healthcare Providers: https://www.young.biz/https://www.fda.gov/media/142435/download This test is not yet approved or cleared by the [place] FDA and  has been authorized for detection and/or diagnosis of SARS-CoV-2 by  FDA under an Emergency Use Authorization (EUA). This EUA will remain  in effect (meaning this test can be used) for the duration of the  Covid-19 declaration under Section 564(b)(1) of the Act, 21  U.S.C. section 360bbb-3(b)(1), unless the authorization is  terminated or revoked. Performed at [facility], [address] [place], [place] [address]   Surgical pcr screen     Status: None   Collection Time: 02/16/20  3:30 PM   Specimen: Nasal Mucosa; Nasal Swab  Result Value Ref Range Status   MRSA, PCR NEGATIVE NEGATIVE Final   Staphylococcus aureus NEGATIVE NEGATIVE Final    Comment: (NOTE) The Xpert SA Assay (FDA approved for NASAL specimens in patients 65 years of age and older), is one component of a comprehensive surveillance program. It is not intended to diagnose infection nor to guide or monitor treatment. Performed at [facility], [address] [place], [place] [address]          Radiology Studies: DG Tibia/Fibula Left  Result Date: 02/16/2020 CLINICAL DATA:  External fixation left tib fib EXAM: DG C-ARM 1-60 MIN; LEFT TIBIA AND FIBULA - 2 VIEW CONTRAST:  None FLUOROSCOPY TIME:  Fluoroscopy Time:  16 seconds Number of Acquired Spot Images: 2 COMPARISON:  02/16/2020 FINDINGS: Two low resolution intraoperative spot views of the left tibia and fibula.  Highly comminuted proximal tibial fracture again noted. IMPRESSION: Intraoperative fluoroscopic assistance provided during presumed external fixation Electronically Signed   By: Jasmine PangKim  Fujinaga M.D.   On: 02/16/2020 22:43   CT CHEST W CONTRAST  Addendum Date: 02/17/2020   ADDENDUM REPORT: 02/17/2020 14:06 ADDENDUM: Critical Value/emergent results were called by telephone at the time of interpretation on 02/17/2020 at 2:01 pm to provider Hosie SpangleElizabeth Simaan, who verbally acknowledged these results. Electronically Signed   By: Donzetta KohutGeoffrey  Wile M.D.   On: 02/17/2020 14:06   Result Date: 02/17/2020 CLINICAL DATA:  Pneumothorax peer EXAM: CT CHEST WITH CONTRAST TECHNIQUE: Multidetector CT imaging of the chest was performed during intravenous contrast administration. CONTRAST:  75mL OMNIPAQUE IOHEXOL 300 MG/ML  SOLN COMPARISON:  Chest x-ray of 02/17/2020 FINDINGS: Cardiovascular: Aortic caliber is normal. No periaortic stranding. Calcified atherosclerotic changes throughout the aorta. Calcified coronary artery disease. Heart size normal. No pericardial effusion. Central pulmonary arteries are unremarkable. Mediastinum/Nodes: Shift of  the mediastinum from right to left related to tension pneumothorax. No sign of mediastinal hematoma. Lungs/Pleura: Large right pneumothorax more anterior and basilar than and apical with areas of airspace opacity related to volume loss and or contusion, partial collapse of right upper and right middle lobe suspected. Left chest is clear. Airways are patent. Upper Abdomen: Lobular hepatic contours with potential hepatic steatosis. Distended gallbladder without pericholecystic stranding and with small gallstones layering dependently. No biliary ductal distension. Upper abdominal viscera are incompletely imaged, otherwise unremarkable. Musculoskeletal: Mild stranding in the right chest wall associated with numerous rib fractures. Fractures of ribs 3 through 7 on the right, segmental fracture of  the right fifth rib. No displaced rib fractures on the left. Sternum is intact. Visualized clavicles and scapulae are unremarkable. Costochondral elements are intact IMPRESSION: 1. Large right pneumothorax under tension with shift of mediastinal structures from right to left. 2. Multiple right-sided rib fractures, segmental fracture of the right fifth rib. 3. Cholelithiasis without evidence of acute cholecystitis. 4. Lobular hepatic contours with potential hepatic steatosis. 5. Aortic atherosclerosis. 6. A call is out to the referring provider to further discuss findings in the above case. Aortic Atherosclerosis (ICD10-I70.0). Electronically Signed: By: Donzetta Kohut M.D. On: 02/17/2020 13:41   DG CHEST PORT 1 VIEW  Result Date: 02/18/2020 CLINICAL DATA:  Right chest tube EXAM: PORTABLE CHEST 1 VIEW COMPARISON:  02/17/2020 FINDINGS: Right-sided chest tube/pleural catheter projects minimally more superiorly today. Midline trachea. Normal heart size. Tortuous thoracic aorta. Atherosclerosis in the transverse aorta. No pleural effusion or pneumothorax. No lobar consolidation. Mild right hemidiaphragm elevation. IMPRESSION: Right-sided chest tube in place, without pneumothorax or acute disease. Aortic Atherosclerosis (ICD10-I70.0). Electronically Signed   By: Jeronimo Greaves M.D.   On: 02/18/2020 09:24   DG CHEST PORT 1 VIEW  Result Date: 02/17/2020 CLINICAL DATA:  Right-sided chest tube EXAM: PORTABLE CHEST 1 VIEW COMPARISON:  02/17/2020 FINDINGS: Right-sided chest tube. No definite residual pneumothorax. No pleural effusion. No focal consolidation. Stable cardiomediastinal silhouette. No aggressive osseous lesion. IMPRESSION: Right-sided chest tube. No definite residual pneumothorax. Electronically Signed   By: Elige Ko   On: 02/17/2020 14:14   DG CHEST PORT 1 VIEW  Result Date: 02/17/2020 CLINICAL DATA:  RIGHT pneumothorax, rib fracture EXAM: PORTABLE CHEST 1 VIEW COMPARISON:  Radiograph 02/16/2020  FINDINGS: Normal cardiac silhouette. There is a wedge-shaped density in the RIGHT upper lobe which is new from prior. RIGHT lower lateral rib fracture again noted. New apparent pleural edge in the RIGHT upper lobe adjacent to the wedge-shaped density is concerning for an enlarging pneumothorax. The RIGHT cardiac border is less prominent than the prior. No clear evidence of midline shift. IMPRESSION: 1. Suspicion for enlarging RIGHT pneumothorax. 2. RIGHT cardiac border is less prominent which may relate to positioning however, cannot exclude midline shift (tension pneumothorax). 3. Wedge-shaped density in the RIGHT upper lobe suggest atelectasis. 4. Posterolateral RIGHT rib fracture. Critical Value/emergent results were called by telephone at the time of interpretation on 02/17/2020 at 9:41 am to provider [facility] , who verbally acknowledged these results. Electronically Signed   By: Genevive Bi M.D.   On: 02/17/2020 09:42   DG Knee Left Port  Result Date: 02/16/2020 CLINICAL DATA:  Postop EXAM: PORTABLE LEFT KNEE - 1-2 VIEW COMPARISON:  CT 02/16/2020, radiograph 02/16/2020 FINDINGS: Acute highly comminuted proximal tibial fracture with involvement of the medial and lateral tibial plateaus. Anteriorly displaced tibial tuberosity without significant change. Decreased posterior displacement of main distal fracture fragment compared to  previous. Acute fibular head fracture without significant displacement. Large lipohemarthrosis. IMPRESSION: Highly comminuted proximal tibial fracture with involvement of medial and lateral tibial plateau with overall decreased displacement of fracture fragments compared to previous. Acute nondisplaced fibular head fracture. Electronically Signed   By: Jasmine Pang M.D.   On: 02/16/2020 23:57   DG C-Arm 1-60 Min  Result Date: 02/16/2020 CLINICAL DATA:  External fixation left tib fib EXAM: DG C-ARM 1-60 MIN; LEFT TIBIA AND FIBULA - 2 VIEW CONTRAST:  None FLUOROSCOPY  TIME:  Fluoroscopy Time:  16 seconds Number of Acquired Spot Images: 2 COMPARISON:  02/16/2020 FINDINGS: Two low resolution intraoperative spot views of the left tibia and fibula. Highly comminuted proximal tibial fracture again noted. IMPRESSION: Intraoperative fluoroscopic assistance provided during presumed external fixation Electronically Signed   By: Jasmine Pang M.D.   On: 02/16/2020 22:43        Scheduled Meds: . acetaminophen  500 mg Oral Q12H  . vitamin C  500 mg Oral Daily  . aspirin  81 mg Oral Daily  . cholecalciferol  2,000 Units Oral BID  . docusate sodium  100 mg Oral BID  . enoxaparin (LOVENOX) injection  40 mg Subcutaneous Q24H  . gabapentin  300 mg Oral QHS  . insulin aspart  0-9 Units Subcutaneous TID WC  . pantoprazole  20 mg Oral Daily  . polyethylene glycol  17 g Oral Daily  . rosuvastatin  10 mg Oral QHS   Continuous Infusions: . lactated ringers Stopped (02/17/20 0100)  . methocarbamol (ROBAXIN) IV       LOS: 2 days    Time spent: 35 minutes spent on chart review, discussion with nursing staff, consultants, updating family and interview/physical exam; more than 50% of that time was spent in counseling and/or coordination of care.    Alvira Philips [place], DO [facility] Available via Epic secure chat 7am-7pm After these hours, please refer to coverage provider listed on [email or web address] 02/18/2020, 2:00 PM

## 2020-02-18 NOTE — Progress Notes (Signed)
Physical Therapy Treatment Patient Details Name: Cameron Velasquez MRN: [id number] DOB: Dec 29, 1955 Today's Date: 02/18/2020    History of Present Illness Cameron Velasquez is an 64 y.o. male hx of DM and HTN.  Admitted after a fall from ladder, 50ft, resulting in left tibia fx, right 7th rib fx, right LE laceration. S/p I&D, closed reduction, external fixation of LLE. s/p I and D of RLE laceration. Pt with R pneumothorax and is s/p chest tube insertion.      PT Comments    Pt progressing well towards his physical therapy goals, able to transfer out of bed to chair today. Requiring two person moderate assist and a walker for transfers, HR stable. Still not quite able to hop yet and pain at chest tube site is limiting. However, pt remains very motivated to participate and able to perform therapeutic exercises for ROM and strengthening. Continue to recommend comprehensive inpatient rehab (CIR) for post-acute therapy needs.     Follow Up Recommendations  CIR     Equipment Recommendations  Rolling walker with 5" wheels;3in1 (PT);Wheelchair (measurements PT);Other (comment)(elevating legrests)    Recommendations for Other Services       Precautions / Restrictions Precautions Precautions: Fall Precaution Comments: chest tube, ex fix LLE Restrictions Weight Bearing Restrictions: Yes RLE Weight Bearing: Weight bearing as tolerated LLE Weight Bearing: Non weight bearing Other Position/Activity Restrictions: No ROM restrictions RLE    Mobility  Bed Mobility Overal bed mobility: Needs Assistance Bed Mobility: Supine to Sit     Supine to sit: Min assist     General bed mobility comments: MinA for LLE management, cues for use of bed rail to push off  Transfers Overall transfer level: Needs assistance Equipment used: Rolling walker (2 wheeled) Transfers: Sit to/from [facility] Sit to Stand: Mod assist;+2 physical assistance Stand pivot transfers: Mod assist;+2  physical assistance       General transfer comment: ModA + 2 to power up into standing, cues for hand placement. Able to shimmy RLE over and pivot towards right with cues for sequencing/direction.  Ambulation/Gait                 Stairs             Wheelchair Mobility    Modified Rankin (Stroke Patients Only)       Balance Overall balance assessment: Needs assistance Sitting-balance support: Feet unsupported Sitting balance-Leahy Scale: Good     Standing balance support: Bilateral upper extremity supported Standing balance-Leahy Scale: Poor Standing balance comment: reliant on external support due to LLE NWB                            Cognition Arousal/Alertness: Awake/alert Behavior During Therapy: WFL for tasks assessed/performed Overall Cognitive Status: Within Functional Limits for tasks assessed                                        Exercises General Exercises - Lower Extremity Ankle Circles/Pumps: Left;10 reps;Supine Heel Slides: Right;10 reps;Supine Straight Leg Raises: Right;10 reps;Supine    General Comments        Pertinent Vitals/Pain Pain Assessment: Faces Faces Pain Scale: Hurts even more Pain Location: chest tube site Pain Descriptors / Indicators: Sore Pain Intervention(s): Limited activity within patient's tolerance;Monitored during session    Home Living  Prior Function            PT Goals (current goals can now be found in the care plan section) Acute Rehab PT Goals Patient Stated Goal: to go home to his wife Potential to Achieve Goals: Good Progress towards PT goals: Progressing toward goals    Frequency    Min 5X/week      PT Plan Current plan remains appropriate    Co-evaluation              AM-PAC PT "6 Clicks" Mobility   Outcome Measure  Help needed turning from your back to your side while in a flat bed without using bedrails?: A  Little Help needed moving from lying on your back to sitting on the side of a flat bed without using bedrails?: A Little Help needed moving to and from a bed to a chair (including a wheelchair)?: A Lot Help needed standing up from a chair using your arms (e.g., wheelchair or bedside chair)?: A Lot Help needed to walk in hospital room?: Total Help needed climbing 3-5 steps with a railing? : Total 6 Click Score: 12    End of Session Equipment Utilized During Treatment: Gait belt Activity Tolerance: Patient tolerated treatment well Patient left: in chair;with call bell/phone within reach;with chair alarm set Nurse Communication: Mobility status PT Visit Diagnosis: Unsteadiness on feet (R26.81);History of falling (Z91.81);Difficulty in walking, not elsewhere classified (R26.2)     Time: 8185-6314 PT Time Calculation (min) (ACUTE ONLY): 25 min  Charges:  $Therapeutic Exercise: 8-22 mins $Therapeutic Activity: 8-22 mins                       Lillia Pauls, PT, DPT Acute Rehabilitation Services Pager [phone] Office [phone]    Norval Morton 02/18/2020, 4:44 PM

## 2020-02-18 NOTE — Progress Notes (Signed)
2 Days Post-Op  Subjective: CC: Doing well. Off o2. Some ribcage pain. No sob. Pulling 2250 on IS.   Objective: Vital signs in last 24 hours: Temp:  [98.2 F (36.8 C)-98.6 F (37 C)] 98.2 F (36.8 C) (04/14 0326) Pulse Rate:  [88-98] 98 (04/14 0326) Resp:  [15-19] 17 (04/14 0326) BP: (131-154)/(72-95) 131/81 (04/14 0326) SpO2:  [96 %-100 %] 96 % (04/14 0326) Last BM Date: 02/16/20  Intake/Output from previous day: 04/13 0701 - 04/14 0700 In: 2430.3 [P.O.:960; I.V.:1269.3; IV Piggyback:200.1] Out: 2000 [Urine:2000] Intake/Output this shift: No intake/output data recorded.  PE: Gen:  Alert, NAD, pleasant Card:  RRR, no M/G/R heard Pulm:  CT in place without air leak. Minimal bloody output in cannister. CTAB, no W/R/R, effort normal. Pulling 2250 on IS.  Abd: Soft, NT/ND, +BS Ext: RLE with ace bandage in place. Wiggles toes of RLE. SILT. LLE with ace bandage in place. Wiggles toes of LLE. SILT.  Cap refill < 2 seconds b/l. Prevena wound vac in place.  Psych: A&Ox3  Skin: no rashes noted, warm and dry  Lab Results:  Recent Labs    02/17/20 0419 02/18/20 0249  WBC 15.1* 11.4*  HGB 11.7* 9.8*  HCT 35.2* 29.0*  PLT 197 153   BMET Recent Labs    02/17/20 0419 02/18/20 0249  NA 139 138  K 5.0 4.1  CL 105 105  CO2 22 25  GLUCOSE 207* 185*  BUN 24* 17  CREATININE 1.40* 1.02  CALCIUM 8.2* 8.2*   PT/INR Recent Labs    02/16/20 1119  LABPROT 13.7  INR 1.1   CMP     Component Value Date/Time   NA 138 02/18/2020 0249   K 4.1 02/18/2020 0249   CL 105 02/18/2020 0249   CO2 25 02/18/2020 0249   GLUCOSE 185 (H) 02/18/2020 0249   BUN 17 02/18/2020 0249   CREATININE 1.02 02/18/2020 0249   CALCIUM 8.2 (L) 02/18/2020 0249   PROT 6.0 (L) 02/17/2020 0419   ALBUMIN 3.2 (L) 02/18/2020 0249   AST 28 02/17/2020 0419   ALT 18 02/17/2020 0419   ALKPHOS 43 02/17/2020 0419   BILITOT 0.8 02/17/2020 0419   GFRNONAA >60 02/18/2020 0249   GFRAA >60 02/18/2020 0249    Lipase  No results found for: LIPASE     Studies/Results: DG Tibia/Fibula Left  Result Date: 02/16/2020 CLINICAL DATA:  External fixation left tib fib EXAM: DG C-ARM 1-60 MIN; LEFT TIBIA AND FIBULA - 2 VIEW CONTRAST:  None FLUOROSCOPY TIME:  Fluoroscopy Time:  16 seconds Number of Acquired Spot Images: 2 COMPARISON:  02/16/2020 FINDINGS: Two low resolution intraoperative spot views of the left tibia and fibula. Highly comminuted proximal tibial fracture again noted. IMPRESSION: Intraoperative fluoroscopic assistance provided during presumed external fixation Electronically Signed   By: Donavan Foil M.D.   On: 02/16/2020 22:43   CT Head Wo Contrast  Result Date: 02/16/2020 CLINICAL DATA:  Trauma EXAM: CT HEAD WITHOUT CONTRAST TECHNIQUE: Contiguous axial images were obtained from the base of the skull through the vertex without intravenous contrast. COMPARISON:  None. FINDINGS: Brain: There is no acute intracranial hemorrhage, mass effect, or edema. There is a small subacute or chronic infarct of the right occipital lobe. Gray-white differentiation is otherwise preserved. There is no extra-axial fluid collection. Ventricles and sulci are within normal limits in size and configuration. Patchy hypoattenuation in the supratentorial white matter is nonspecific but may reflect mild chronic microvascular ischemic changes. Vascular: No hyperdense vessel  or unexpected calcification. Skull: Calvarium is unremarkable. Sinuses/Orbits: Minor mucosal thickening.  Orbits are unremarkable. Other: None. IMPRESSION: No evidence of acute intracranial injury. 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 Electronically Signed   By: Guadlupe SpanishPraneil  Patel M.D.   On: 02/16/2020 11:31   CT CHEST W CONTRAST  Addendum Date: 02/17/2020   ADDENDUM REPORT: 02/17/2020 14:06 ADDENDUM: Critical Value/emergent results were called by telephone at the time of interpretation on 02/17/2020 at 2:01 pm to provider Hosie SpangleElizabeth Simaan, who  verbally acknowledged these results. Electronically Signed   By: Donzetta KohutGeoffrey  Wile M.D.   On: 02/17/2020 14:06   Result Date: 02/17/2020 CLINICAL DATA:  Pneumothorax peer EXAM: CT CHEST WITH CONTRAST TECHNIQUE: Multidetector CT imaging of the chest was performed during intravenous contrast administration. CONTRAST:  75mL OMNIPAQUE IOHEXOL 300 MG/ML  SOLN COMPARISON:  Chest x-ray of 02/17/2020 FINDINGS: Cardiovascular: Aortic caliber is normal. No periaortic stranding. Calcified atherosclerotic changes throughout the aorta. Calcified coronary artery disease. Heart size normal. No pericardial effusion. Central pulmonary arteries are unremarkable. Mediastinum/Nodes: Shift of the mediastinum from right to left related to tension pneumothorax. No sign of mediastinal hematoma. Lungs/Pleura: Large right pneumothorax more anterior and basilar than and apical with areas of airspace opacity related to volume loss and or contusion, partial collapse of right upper and right middle lobe suspected. Left chest is clear. Airways are patent. Upper Abdomen: Lobular hepatic contours with potential hepatic steatosis. Distended gallbladder without pericholecystic stranding and with small gallstones layering dependently. No biliary ductal distension. Upper abdominal viscera are incompletely imaged, otherwise unremarkable. Musculoskeletal: Mild stranding in the right chest wall associated with numerous rib fractures. Fractures of ribs 3 through 7 on the right, segmental fracture of the right fifth rib. No displaced rib fractures on the left. Sternum is intact. Visualized clavicles and scapulae are unremarkable. Costochondral elements are intact IMPRESSION: 1. Large right pneumothorax under tension with shift of mediastinal structures from right to left. 2. Multiple right-sided rib fractures, segmental fracture of the right fifth rib. 3. Cholelithiasis without evidence of acute cholecystitis. 4. Lobular hepatic contours with potential  hepatic steatosis. 5. Aortic atherosclerosis. 6. A call is out to the referring provider to further discuss findings in the above case. Aortic Atherosclerosis (ICD10-I70.0). Electronically Signed: By: Donzetta KohutGeoffrey  Wile M.D. On: 02/17/2020 13:41   CT Cervical Spine Wo Contrast  Result Date: 02/16/2020 CLINICAL DATA:  Trauma. [facility] CT CERVICAL SPINE WITHOUT CONTRAST TECHNIQUE: Multidetector CT imaging of the cervical spine was performed without intravenous contrast. Multiplanar CT image reconstructions were also generated. COMPARISON:  None. FINDINGS: Alignment: Mild cervical spine straightening. No listhesis. Skull base and vertebrae: Advanced left C2-3 facet arthropathy with slight facet joint widening, likely degenerative. More prominent widening of the right C5-6 facet joint with milder underlying arthropathy. Possible mild widening of the C6-7 disc space anteriorly. No acute cervical spine fracture identified. Small T1 superior endplate Schmorl's node. Soft tissues and spinal canal: No prevertebral fluid or swelling. No visible canal hematoma. Disc levels: Mild multilevel cervical disc degeneration and moderate to severe multilevel facet arthrosis. No evidence of high-grade spinal or neural foraminal stenosis. Upper chest: Clear lung apices. Other: None. IMPRESSION: 1. Right C5-6 facet joint widening which could be traumatic or degenerative with possible mild widening of the C6-7 disc space. Cervical spine MRI is recommended to evaluate for soft tissue/ligamentous injury. 2. No acute cervical spine fracture identified. Electronically Signed   By: Sebastian AcheAllen  Grady M.D.   On: 02/16/2020 11:41  CT Knee Left Wo Contrast  Result Date: 02/16/2020 CLINICAL DATA:  Larey Seat 15 feet and injured knee. EXAM: CT OF THE left KNEE WITHOUT CONTRAST TECHNIQUE: Multidetector CT imaging of the left knee was performed according to the standard protocol. Multiplanar CT image reconstructions were also  generated. COMPARISON:  Radiographs same date. FINDINGS: Complex comminuted proximal tibia fracture. The main fracture line is a transverse fracture through the tibial metaphysis with marked comminution and posterior and mild lateral displacement. There are also oblique coursing associated tibial plateau fractures. There is significant depression and comminution involving the anterior aspect of the lateral tibial plateau. Maximum depression is approximately 8 mm. There is a nondisplaced nondepressed fracture involving the medial plateau along the medial tibial spine and out through the posterior cortex. Mildly displaced avulsion type fracture involving the fibular head. The femur and patella are intact. There is fairly marked displacement of the avulse tibial tubercle fragment. Large lipohemarthrosis is noted. The quadriceps and patellar tendons appear grossly intact. The ACL and PCL are grossly intact by CT. The medial collateral ligament complex appears to be intact. The LCL complex is difficult to evaluate. IMPRESSION: 1. Complex comminuted proximal tibia fractures as discussed above. 2. Mildly displaced avulsion type fracture involving the fibular head. 3. Large lipohemarthrosis. Electronically Signed   By: Rudie Meyer M.D.   On: 02/16/2020 11:45   MR Cervical Spine Wo Contrast  Result Date: 02/16/2020 CLINICAL DATA:  Fell 15 feet. Back and bilateral leg pain. EXAM: MRI CERVICAL SPINE WITHOUT CONTRAST TECHNIQUE: Multiplanar, multisequence MR imaging of the cervical spine was performed. No intravenous contrast was administered. COMPARISON:  CT cervical spine, same date. FINDINGS: Examination is quite limited due to patient motion. Alignment: Normal overall alignment. Vertebrae: Normal marrow signal. No bone lesions or fractures. Cord: Grossly normal cord signal intensity. No cord lesions or cord edema. Posterior Fossa, vertebral arteries, paraspinal tissues: No significant findings. No abnormal prevertebral  soft tissue swelling or hematoma. Disc levels: No significant cervical disc protrusions. No canal compromise. No significant foraminal stenosis. As demonstrated on the CT scan there is widening of the right C5-6 facet joint but I do not see any surrounding inflammatory changes or marrow edema to suggest this is traumatic. It is most likely degenerative. IMPRESSION: 1. Limited examination due to patient motion. 2. No significant cervical disc protrusions, canal compromise or foraminal stenosis. 3. Normal MR appearance of the cervical spinal cord. 4. As noted on the CT scan there is widening of the right C5-6 facet joint but no surrounding inflammatory changes or marrow edema to suggest an acute traumatic injury. Electronically Signed   By: Rudie Meyer M.D.   On: 02/16/2020 13:49   DG Pelvis Portable  Result Date: 02/16/2020 CLINICAL DATA:  Fall. EXAM: PORTABLE PELVIS 1-2 VIEWS COMPARISON:  None. FINDINGS: There is no evidence of pelvic fracture or diastasis. No pelvic bone lesions are seen. IMPRESSION: Negative. Electronically Signed   By: Lupita Raider M.D.   On: 02/16/2020 11:01   DG CHEST PORT 1 VIEW  Result Date: 02/17/2020 CLINICAL DATA:  Right-sided chest tube EXAM: PORTABLE CHEST 1 VIEW COMPARISON:  02/17/2020 FINDINGS: Right-sided chest tube. No definite residual pneumothorax. No pleural effusion. No focal consolidation. Stable cardiomediastinal silhouette. No aggressive osseous lesion. IMPRESSION: Right-sided chest tube. No definite residual pneumothorax. Electronically Signed   By: Elige Ko   On: 02/17/2020 14:14   DG CHEST PORT 1 VIEW  Result Date: 02/17/2020 CLINICAL DATA:  RIGHT pneumothorax, rib fracture EXAM: PORTABLE CHEST  1 VIEW COMPARISON:  Radiograph 02/16/2020 FINDINGS: Normal cardiac silhouette. There is a wedge-shaped density in the RIGHT upper lobe which is new from prior. RIGHT lower lateral rib fracture again noted. New apparent pleural edge in the RIGHT upper lobe  adjacent to the wedge-shaped density is concerning for an enlarging pneumothorax. The RIGHT cardiac border is less prominent than the prior. No clear evidence of midline shift. IMPRESSION: 1. Suspicion for enlarging RIGHT pneumothorax. 2. RIGHT cardiac border is less prominent which may relate to positioning however, cannot exclude midline shift (tension pneumothorax). 3. Wedge-shaped density in the RIGHT upper lobe suggest atelectasis. 4. Posterolateral RIGHT rib fracture. Critical Value/emergent results were called by telephone at the time of interpretation on 02/17/2020 at 9:41 am to provider [facility] , who verbally acknowledged these results. Electronically Signed   By: Genevive Bi M.D.   On: 02/17/2020 09:42   DG Chest Portable 1 View  Result Date: 02/16/2020 CLINICAL DATA:  Fall. EXAM: PORTABLE CHEST 1 VIEW COMPARISON:  None. FINDINGS: The heart size and mediastinal contours are within normal limits. Left lung is clear. No pleural effusion is noted. Mildly displaced fracture is seen involving lateral portion of the right seventh rib. There appears to be a small right basilar pneumothorax laterally in the right lung base. IMPRESSION: Mildly displaced right seventh rib fracture. Probable small right basilar pneumothorax is noted laterally. CT scan may be performed for further evaluation. Electronically Signed   By: Lupita Raider M.D.   On: 02/16/2020 10:59   DG Knee Left Port  Result Date: 02/16/2020 CLINICAL DATA:  Postop EXAM: PORTABLE LEFT KNEE - 1-2 VIEW COMPARISON:  CT 02/16/2020, radiograph 02/16/2020 FINDINGS: Acute highly comminuted proximal tibial fracture with involvement of the medial and lateral tibial plateaus. Anteriorly displaced tibial tuberosity without significant change. Decreased posterior displacement of main distal fracture fragment compared to previous. Acute fibular head fracture without significant displacement. Large lipohemarthrosis. IMPRESSION: Highly comminuted  proximal tibial fracture with involvement of medial and lateral tibial plateau with overall decreased displacement of fracture fragments compared to previous. Acute nondisplaced fibular head fracture. Electronically Signed   By: Jasmine Pang M.D.   On: 02/16/2020 23:57   DG Tibia/Fibula Left Port  Result Date: 02/16/2020 CLINICAL DATA:  Fall with open wounds on both shins. EXAM: PORTABLE LEFT TIBIA AND FIBULA - 2 VIEW COMPARISON:  None. FINDINGS: There is a comminuted fracture of the proximal tibia which appears to extend into both the medial and lateral tibial plateaus on either side of the tibial spines. The fracture extends into the proximal tibial shaft with multiple mildly displaced fragments. There is overlying soft tissue swelling with scattered small foci of gas consistent with the provided history of an open wound. No definite fibular fracture is identified. The knee and ankle are located. Patellar and calcaneal spurs are noted. IMPRESSION: Comminuted fracture of the proximal tibia. Electronically Signed   By: Sebastian Ache M.D.   On: 02/16/2020 11:11   DG Tibia/Fibula Right Port  Result Date: 02/16/2020 CLINICAL DATA:  Fall with open wounds on both shins. EXAM: PORTABLE RIGHT TIBIA AND FIBULA - 2 VIEW COMPARISON:  None. FINDINGS: There is soft tissue injury with swelling anterior to the mid tibial shaft. No radiopaque foreign body or acute fracture is identified. The knee and ankle are located. Nonspecific soft tissue calcifications are noted about the knee. There are superior and inferior patellar enthesophytes, and there is mild femorotibial marginal spurring. IMPRESSION: Soft tissue swelling without acute osseous abnormality.  Electronically Signed   By: Sebastian Ache M.D.   On: 02/16/2020 11:07   DG C-Arm 1-60 Min  Result Date: 02/16/2020 CLINICAL DATA:  External fixation left tib fib EXAM: DG C-ARM 1-60 MIN; LEFT TIBIA AND FIBULA - 2 VIEW CONTRAST:  None FLUOROSCOPY TIME:  Fluoroscopy Time:   16 seconds Number of Acquired Spot Images: 2 COMPARISON:  02/16/2020 FINDINGS: Two low resolution intraoperative spot views of the left tibia and fibula. Highly comminuted proximal tibial fracture again noted. IMPRESSION: Intraoperative fluoroscopic assistance provided during presumed external fixation Electronically Signed   By: Jasmine Pang M.D.   On: 02/16/2020 22:43    Anti-infectives: Anti-infectives (From admission, onward)   Start     Dose/Rate Route Frequency Ordered Stop   02/17/20 0600  ceFAZolin (ANCEF) 3 g in dextrose 5 % 50 mL IVPB  Status:  Discontinued     3 g 100 mL/hr over 30 Minutes Intravenous On call to O.R. 02/16/20 1757 02/16/20 1948   02/17/20 0400  ceFAZolin (ANCEF) IVPB 2g/100 mL premix     2 g 200 mL/hr over 30 Minutes Intravenous Every 8 hours 02/17/20 0025 02/17/20 2100   02/16/20 2000  ceFAZolin (ANCEF) 3 g in dextrose 5 % 50 mL IVPB     3 g 100 mL/hr over 30 Minutes Intravenous On call to O.R. 02/16/20 1948 02/16/20 2120       Assessment/Plan Fall from ladder, 97ft Right 7th rib fx w/ tension PTX -  CT to -20. No air leak. AM CXR pending, look like no residual PTX on my review. Multimodal pain control. Pulm toilet, IS Left Tibia Fx- Per Ortho. NWB. S/p I&D, closed reduction, external fixation 4/12. PT/OT. They plan for definitive fixation Monday or Tuesday  Right lower extremity laceration- Per Ortho. S/p I&D and closure in OR 4/12. WBAT HTN - Per TRH DM2 - Per TRH HLD - Per TRH C-Spine- Cleared  FEN - CM  VTE -SCDs, Lovenox ID -Ancef peri-op. None currently.   LOS: 2 days    Jacinto Halim , [facility] 02/18/2020, 8:37 AM Please see Amion for pager number during day hours 7:00am-4:30pm

## 2020-02-18 NOTE — Progress Notes (Addendum)
[facility]edic [facility] Progress Note  Patient ID: Cameron Velasquez MRN: [id number] DOB/AGE: [birth year] 64 y.o.  Subjective:  Doing ok  Sore from Chest tube placement yesterday   No specific complaints this am  No CP or SOB   Labs improve CK appears to have stabilized  BP elevated--> ace inhibitor on hold due to AKI  Renal function normalized  ROS As above Objective:   VITALS:   Vitals:   02/17/20 1834 02/17/20 1908 02/18/20 0326 02/18/20 0841  BP:  (!) 141/72 131/81 (!) 173/86  Pulse:  93 98 87  Resp:  19 17 17   Temp:  98.6 F (37 C) 98.2 F (36.8 C) 98.5 F (36.9 C)  TempSrc:  Oral Oral Oral  SpO2: 98% 98% 96% 99%  Weight:      Height:        Estimated body mass index is 36.96 kg/m as calculated from the following:   Height as of this encounter: 5\' 11"  (1.803 m).   Weight as of this encounter: 120.2 kg.   Intake/Output      04/13 0701 - 04/14 0700 04/14 0701 - 04/15 0700   P.O. 960 240   I.V. (mL/kg) 1269.3 (10.6)    IV Piggyback 200.1    Chest Tube 1    Total Intake(mL/kg) 2430.3 (20.2) 240 (2)   Urine (mL/kg/hr) 2000 (0.7) 300 (0.6)   Drains 0    Stool 0    Chest Tube 0    Total Output 2000 300   Net +430.3 -60        Urine Occurrence 2 x      LABS  Results for orders placed or performed during the hospital encounter of 02/16/20 (from the past 24 hour(s))  Glucose, capillary     Status: Abnormal   Collection Time: 02/17/20 11:48 AM  Result Value Ref Range   Glucose-Capillary 152 (H) 70 - 99 mg/dL  Glucose, capillary     Status: Abnormal   Collection Time: 02/17/20  4:58 PM  Result Value Ref Range   Glucose-Capillary 166 (H) 70 - 99 mg/dL  Glucose, capillary     Status: Abnormal   Collection Time: 02/17/20  8:39 PM  Result Value Ref Range   Glucose-Capillary 176 (H) 70 - 99 mg/dL  CK     Status: Abnormal   Collection Time: 02/18/20  2:49 AM  Result Value Ref  Range   Total CK 504 (H) 49 - 397 U/L  Lactic acid, plasma     Status: None   Collection Time: 02/18/20  2:49 AM  Result Value Ref Range   Lactic Acid, Venous 1.0 0.5 - 1.9 mmol/L  CBC     Status: Abnormal   Collection Time: 02/18/20  2:49 AM  Result Value Ref Range   WBC 11.4 (H) 4.0 - 10.5 K/uL   RBC 3.19 (L) 4.22 - 5.81 MIL/uL   Hemoglobin 9.8 (L) 13.0 - 17.0 g/dL   HCT 29.0 (L) 39.0 - 52.0 %   MCV 90.9 80.0 - 100.0 fL   MCH 30.7 26.0 - 34.0 pg   MCHC 33.8 30.0 - 36.0 g/dL   RDW 13.2 11.5 - 15.5 %   Platelets 153 150 - 400 K/uL   nRBC 0.0 0.0 - 0.2 %  Renal function panel     Status:  Abnormal   Collection Time: 02/18/20  2:49 AM  Result Value Ref Range   Sodium 138 135 - 145 mmol/L   Potassium 4.1 3.5 - 5.1 mmol/L   Chloride 105 98 - 111 mmol/L   CO2 25 22 - 32 mmol/L   Glucose, Bld 185 (H) 70 - 99 mg/dL   BUN 17 8 - 23 mg/dL   Creatinine, Ser 6.16 0.61 - 1.24 mg/dL   Calcium 8.2 (L) 8.9 - 10.3 mg/dL   Phosphorus 3.7 2.5 - 4.6 mg/dL   Albumin 3.2 (L) 3.5 - 5.0 g/dL   GFR calc non Af Amer >60 >60 mL/min   GFR calc Af Amer >60 >60 mL/min   Anion gap 8 5 - 15     PHYSICAL EXAM:   Gen: resting comfortably in bed, NAD  Ext:       Left Lower Extremity              prevena functioning well             Distal motor and sensory functions intact             Ext warm              + DP pulse             No pain with passive stretch         Right Lower Extremity              Ex fix stable             pinsites look good              Dressings c/d/i             Ext warm              Swelling controlled             Distal motor and sensory functions intact              No pain with passive stretch   Assessment/Plan: 2 Days Post-Op   Principal Problem:   Left tibial fracture Active Problems:   Leukocytosis   Essential hypertension   Diabetes mellitus type 2, controlled (HCC)   Fall from ladder   Anti-infectives (From admission, onward)   Start     Dose/Rate  Route Frequency Ordered Stop   02/17/20 0600  ceFAZolin (ANCEF) 3 g in dextrose 5 % 50 mL IVPB  Status:  Discontinued     3 g 100 mL/hr over 30 Minutes Intravenous On call to O.R. 02/16/20 1757 02/16/20 1948   02/17/20 0400  ceFAZolin (ANCEF) IVPB 2g/100 mL premix     2 g 200 mL/hr over 30 Minutes Intravenous Every 8 hours 02/17/20 0025 02/17/20 2100   02/16/20 2000  ceFAZolin (ANCEF) 3 g in dextrose 5 % 50 mL IVPB     3 g 100 mL/hr over 30 Minutes Intravenous On call to O.R. 02/16/20 1948 02/16/20 2120    .  POD/HD#: 2 64 y/o male s/p fall off ladder >15 ft    -fall off ladder   - L bicondylar tibial plateau fracture s/p Ex fix              NWB L leg              Aggressive Ice and elevation              Heel cord stretching  Will have foot plate made              Therapy              Plan for OR Monday or Tuesday for definitive fixation              Start pin care on tomorrow     Float heel off bed               - traumatic wound L leg s/p I&D and closure              Dressing change Thursday   - traumatic wound R leg s/p I&D and closure             prevena in place              Will remove next week in OR              WBAT R Leg             No ROM restrictions R hip, knee or ankle    - R PTX s/p chest tube              Per TS     - Pain management:             Continue with current regimen    - ABL anemia/Hemodynamics             Stable             Monitor              - Medical issues              Per medical team                           AKI                          appears improved   Renal function looks to be at baseline   Metformin and ACEI still on hold                         HTN   Defer to medicine on restarting ACEI   DM   Blood sugars have been looking good                             - DVT/PE prophylaxis:             lovenox  - ID:              abx completed    - Metabolic Bone Disease:             Vitamin d levels look good   -  Activity:             WBAT R leg             NWB L leg   - FEN/GI prophylaxis/Foley/Lines:             Carb mod diet             IVF   -Ex-fix/Splint care:             Ok to manipulate L leg by ex fix              -  Impediments to fracture healing:             Severe soft tissue injury              High energy injury              DM   - Dispo:             Continue with inpatient care              OR early next week for definitive fixation    Mearl Latin, PA-C [phone] (C) 02/18/2020, 10:55 AM  [facility] [address] [place] [address] [phone] Collier Bullock (F)

## 2020-02-19 ENCOUNTER — Inpatient Hospital Stay (HOSPITAL_COMMUNITY): Payer: Medicare Other

## 2020-02-19 LAB — BASIC METABOLIC PANEL
Anion gap: 9 (ref 5–15)
BUN: 15 mg/dL (ref 8–23)
CO2: 26 mmol/L (ref 22–32)
Calcium: 8.5 mg/dL — ABNORMAL LOW (ref 8.9–10.3)
Chloride: 101 mmol/L (ref 98–111)
Creatinine, Ser: 0.84 mg/dL (ref 0.61–1.24)
GFR calc Af Amer: >60 mL/min (ref >60)
GFR calc non Af Amer: >60 mL/min (ref >60)
Glucose, Bld: 207 mg/dL — ABNORMAL HIGH (ref 70–99)
Potassium: 3.9 mmol/L (ref 3.5–5.1)
Sodium: 136 mmol/L (ref 135–145)

## 2020-02-19 LAB — CBC
HCT: 28.6 % — ABNORMAL LOW (ref 39.0–52.0)
Hemoglobin: 9.5 g/dL — ABNORMAL LOW (ref 13.0–17.0)
MCH: 30.1 pg (ref 26.0–34.0)
MCHC: 33.2 g/dL (ref 30.0–36.0)
MCV: 90.5 fL (ref 80.0–100.0)
Platelets: 174 10*3/uL (ref 150–400)
RBC: 3.16 MIL/uL — ABNORMAL LOW (ref 4.22–5.81)
RDW: 13.1 % (ref 11.5–15.5)
WBC: 11.4 10*3/uL — ABNORMAL HIGH (ref 4.0–10.5)
nRBC: 0 % (ref 0.0–0.2)

## 2020-02-19 LAB — POTASSIUM: Potassium: 3.8 mmol/L (ref 3.5–5.1)

## 2020-02-19 LAB — GLUCOSE, CAPILLARY
Glucose-Capillary: 163 mg/dL — ABNORMAL HIGH (ref 70–99)
Glucose-Capillary: 194 mg/dL — ABNORMAL HIGH (ref 70–99)
Glucose-Capillary: 165 mg/dL — ABNORMAL HIGH (ref 70–99)
Glucose-Capillary: 158 mg/dL — ABNORMAL HIGH (ref 70–99)
Glucose-Capillary: 202 mg/dL — ABNORMAL HIGH (ref 70–99)

## 2020-02-19 LAB — MAGNESIUM: Magnesium: 1.6 mg/dL — ABNORMAL LOW (ref 1.7–2.4)

## 2020-02-19 MED ORDER — LISINOPRIL 20 MG PO TABS
20.0000 mg | ORAL_TABLET | Freq: Every day | ORAL | Status: DC
  Administered 2020-02-19 – 2020-02-26 (×8): 20 mg via ORAL
  Filled 2020-02-19 (×8): qty 1

## 2020-02-19 NOTE — Progress Notes (Signed)
[facility] Progress Note  Patient ID: Cameron Velasquez MRN: [id number] DOB/AGE: Nov 15, 1955 64 y.o.  Subjective:  Doing better  No specific complaints   R chest tube placed on water seal this am  Labs are stable  cbgs are reasonable   ROS As above   Objective:   VITALS:   Vitals:   02/18/20 2040 02/19/20 0438 02/19/20 0636 02/19/20 0725  BP: (!) 172/90 (!) 164/87 (!) 162/83 (!) 156/79  Pulse:  91  90  Resp:  18 17 16   Temp:  98.3 F (36.8 C) 98.2 F (36.8 C) 98.5 F (36.9 C)  TempSrc:  Oral Oral Oral  SpO2:  93% 96% 97%  Weight:      Height:        Estimated body mass index is 36.96 kg/m as calculated from the following:   Height as of this encounter: 5\' 11"  (1.803 m).   Weight as of this encounter: 120.2 kg.   Intake/Output      04/14 0701 - 04/15 0700 04/15 0701 - 04/16 0700   P.O. 1200    I.V. (mL/kg)     IV Piggyback     Chest Tube     Total Intake(mL/kg) 1200 (10)    Urine (mL/kg/hr) 1350 (0.5)    Emesis/NG output 100    Drains 0    Stool     Chest Tube 1    Total Output 1451    Net -251           LABS  Results for orders placed or performed during the hospital encounter of 02/16/20 (from the past 24 hour(s))  Glucose, capillary     Status: Abnormal   Collection Time: 02/18/20 12:19 PM  Result Value Ref Range   Glucose-Capillary 189 (H) 70 - 99 mg/dL  Glucose, capillary     Status: Abnormal   Collection Time: 02/18/20  4:44 PM  Result Value Ref Range   Glucose-Capillary 227 (H) 70 - 99 mg/dL  Glucose, capillary     Status: Abnormal   Collection Time: 02/18/20  8:27 PM  Result Value Ref Range   Glucose-Capillary 154 (H) 70 - 99 mg/dL  Basic metabolic panel     Status: Abnormal   Collection Time: 02/19/20  4:53 AM  Result Value Ref Range   Sodium 136 135 - 145 mmol/L   Potassium 3.9 3.5 - 5.1 mmol/L   Chloride 101 98 - 111 mmol/L   CO2 26 22 - 32  mmol/L   Glucose, Bld 207 (H) 70 - 99 mg/dL   BUN 15 8 - 23 mg/dL   Creatinine, Ser 02/20/20 0.61 - 1.24 mg/dL   Calcium 8.5 (L) 8.9 - 10.3 mg/dL   GFR calc non Af Amer >60 >60 mL/min   GFR calc Af Amer >60 >60 mL/min   Anion gap 9 5 - 15  CBC     Status: Abnormal   Collection Time: 02/19/20  4:53 AM  Result Value Ref Range   WBC 11.4 (H) 4.0 - 10.5 K/uL   RBC 3.16 (L) 4.22 - 5.81 MIL/uL   Hemoglobin 9.5 (L) 13.0 - 17.0 g/dL   HCT 6.38 (L) 02/21/20 - 75.6 %   MCV 90.5 80.0 - 100.0 fL   MCH 30.1 26.0 - 34.0 pg   MCHC 33.2 30.0 -  36.0 g/dL   RDW 13.1 11.5 - 15.5 %   Platelets 174 150 - 400 K/uL   nRBC 0.0 0.0 - 0.2 %   CBG (last 3)  Recent Labs    02/18/20 1219 02/18/20 1644 02/18/20 2027  GLUCAP 189* 227* 154*     PHYSICAL EXAM:   Gen: resting comfortably in bed, NAD, appears well  Lungs: unlabored  Cardiac: regular  Ext:  Ext:       Left Lower Extremity              prevena functioning well             Distal motor and sensory functions intact             Ext warm              + DP pulse             No pain with passive stretch         Right Lower Extremity              Ex fix stable             pinsites look good              Dressings c/d/i             Ext warm              Swelling controlled             Distal motor and sensory functions intact              No pain with passive stretch      Assessment/Plan: 3 Days Post-Op   Principal Problem:   Left tibial fracture Active Problems:   Leukocytosis   Essential hypertension   Diabetes mellitus type 2, controlled ([place])   Fall from ladder   Anti-infectives (From admission, onward)   Start     Dose/Rate Route Frequency Ordered Stop   02/17/20 0600  ceFAZolin (ANCEF) 3 g in dextrose 5 % 50 mL IVPB  Status:  Discontinued     3 g 100 mL/hr over 30 Minutes Intravenous On call to O.R. 02/16/20 1757 02/16/20 1948   02/17/20 0400  ceFAZolin (ANCEF) IVPB 2g/100 mL premix     2 g 200 mL/hr over 30 Minutes  Intravenous Every 8 hours 02/17/20 0025 02/17/20 2100   02/16/20 2000  ceFAZolin (ANCEF) 3 g in dextrose 5 % 50 mL IVPB     3 g 100 mL/hr over 30 Minutes Intravenous On call to O.R. 02/16/20 1948 02/16/20 2120    .  POD/HD#: 64  64 y/o male s/p fall off ladder >15 ft    -fall off ladder   - L bicondylar tibial plateau fracture s/p Ex fix              NWB L leg              Aggressive Ice and elevation              Heel cord stretching                          Will have foot plate made              Therapy              Plan for OR Monday for definitive fixation  pin care                 Float heel off bed               - traumatic wound L leg s/p I&D and closure              Dressing change tomorrow    - traumatic wound R leg s/p I&D and closure             prevena in place              Will remove next week in OR              WBAT R Leg             No ROM restrictions R hip, knee or ankle    - R PTX s/p chest tube              Per TS     - Pain management:             Continue with current regimen    - ABL anemia/Hemodynamics             Stable             Monitor              - Medical issues              Per medical team                           AKI                          appears improved                         Renal function looks to be at baseline                         Metformin and ACEI still on hold                         HTN                         Defer to medicine on restarting ACEI               DM                         Blood sugars have been looking good                             - DVT/PE prophylaxis:             lovenox  - ID:              abx completed    - Metabolic Bone Disease:             Vitamin d levels look good   - Activity:             WBAT R leg             NWB L leg   - FEN/GI prophylaxis/Foley/Lines:  Carb mod diet             IVF   -Ex-fix/Splint care:             Ok to manipulate L  leg by ex fix              - Impediments to fracture healing:             Severe soft tissue injury              High energy injury              DM   - Dispo:             Continue with inpatient care              OR early next week for definitive fixation      Mearl Latin, PA-C [phone] (C) 02/19/2020, 10:09 AM  [facility] [address] [place] [address] [phone] Collier Bullock (F)

## 2020-02-19 NOTE — Progress Notes (Signed)
Tele tech with call that patient had 6Bts if VTach at 06:25 am this morning. Per tele tech, this was not sustained and patient continued with NSR. VSS. Also noted that patient was getting a bedside CXR.   Will f/u with day shift Nurse.

## 2020-02-19 NOTE — Progress Notes (Signed)
Inpatient Rehab Admissions:  Inpatient Rehab Consult received.  I met with pt at the bedside for rehabilitation assessment. Pt was up in recliner and had just worked with PT. We discussed IP Rehab program details, expectations, and an anticipated LOS. Pt very interested in any rehab program that will get him back to [place] as quickly as possible as he is the caregiver for his wife. Feel pt is a good candidate for CIR at this time. We did discuss that we would follow his progress with therapies over the next few sessions and help determine if he still has IP Rehab needs after his final fixation surgery early next week. Pt in agreement with plan for [place] to follow and was left with CIR brochure to look over.   Will follow.   Signed: Raechel Ache, OTR/L  Rehab Admissions Coordinator  [phone] 02/19/2020 5:38 PM

## 2020-02-19 NOTE — Plan of Care (Signed)

## 2020-02-19 NOTE — Progress Notes (Signed)
3 Days Post-Op  Subjective: CC: Doing well. Some rib pain. No sob. On room air. Pulling 2250 on IS.   Objective: Vital signs in last 24 hours: Temp:  [98.2 F (36.8 C)-99.4 F (37.4 C)] 98.5 F (36.9 C) (04/15 0725) Pulse Rate:  [87-94] 90 (04/15 0725) Resp:  [16-18] 16 (04/15 0725) BP: (156-175)/(79-95) 156/79 (04/15 0725) SpO2:  [93 %-100 %] 97 % (04/15 0725) Last BM Date: 02/18/19  Intake/Output from previous day: 04/14 0701 - 04/15 0700 In: 1200 [P.O.:1200] Out: 1451 [Urine:1350; Emesis/NG output:100; Chest Tube:1] Intake/Output this shift: No intake/output data recorded.  PE: Gen: Alert, NAD, pleasant Card: RRR, no M/G/R heard Pulm:CT in place without air leak. Minimal bloody output in cannister.CTAB, no W/R/R, effort normal. Pulling 2250 on IS. Abd: Soft, NT/ND, +BS Ext:RLE with ace bandage in place. Wiggles toes of RLE. SILT. LLE with ace bandage in place and ex fix. Wiggles toes of LLE. SILT. Cap refill <2 seconds b/l.  Psych: A&Ox3  Skin: no rashes noted, warm and dry  Lab Results:  Recent Labs    02/18/20 0249 02/19/20 0453  WBC 11.4* 11.4*  HGB 9.8* 9.5*  HCT 29.0* 28.6*  PLT 153 174   BMET Recent Labs    02/18/20 0249 02/19/20 0453  NA 138 136  K 4.1 3.9  CL 105 101  CO2 25 26  GLUCOSE 185* 207*  BUN 17 15  CREATININE 1.02 0.84  CALCIUM 8.2* 8.5*   PT/INR Recent Labs    02/16/20 1119  LABPROT 13.7  INR 1.1   CMP     Component Value Date/Time   NA 136 02/19/2020 0453   K 3.9 02/19/2020 0453   CL 101 02/19/2020 0453   CO2 26 02/19/2020 0453   GLUCOSE 207 (H) 02/19/2020 0453   BUN 15 02/19/2020 0453   CREATININE 0.84 02/19/2020 0453   CALCIUM 8.5 (L) 02/19/2020 0453   PROT 6.0 (L) 02/17/2020 0419   ALBUMIN 3.2 (L) 02/18/2020 0249   AST 28 02/17/2020 0419   ALT 18 02/17/2020 0419   ALKPHOS 43 02/17/2020 0419   BILITOT 0.8 02/17/2020 0419   GFRNONAA >60 02/19/2020 0453   GFRAA >60 02/19/2020 0453   Lipase  No  results found for: LIPASE     Studies/Results: CT CHEST W CONTRAST  Addendum Date: 02/17/2020   ADDENDUM REPORT: 02/17/2020 14:06 ADDENDUM: Critical Value/emergent results were called by telephone at the time of interpretation on 02/17/2020 at 2:01 pm to provider Hosie Spangle, who verbally acknowledged these results. Electronically Signed   By: Donzetta Kohut M.D.   On: 02/17/2020 14:06   Result Date: 02/17/2020 CLINICAL DATA:  Pneumothorax peer EXAM: CT CHEST WITH CONTRAST TECHNIQUE: Multidetector CT imaging of the chest was performed during intravenous contrast administration. CONTRAST:  2mL OMNIPAQUE IOHEXOL 300 MG/ML  SOLN COMPARISON:  Chest x-ray of 02/17/2020 FINDINGS: Cardiovascular: Aortic caliber is normal. No periaortic stranding. Calcified atherosclerotic changes throughout the aorta. Calcified coronary artery disease. Heart size normal. No pericardial effusion. Central pulmonary arteries are unremarkable. Mediastinum/Nodes: Shift of the mediastinum from right to left related to tension pneumothorax. No sign of mediastinal hematoma. Lungs/Pleura: Large right pneumothorax more anterior and basilar than and apical with areas of airspace opacity related to volume loss and or contusion, partial collapse of right upper and right middle lobe suspected. Left chest is clear. Airways are patent. Upper Abdomen: Lobular hepatic contours with potential hepatic steatosis. Distended gallbladder without pericholecystic stranding and with small gallstones layering dependently. No  biliary ductal distension. Upper abdominal viscera are incompletely imaged, otherwise unremarkable. Musculoskeletal: Mild stranding in the right chest wall associated with numerous rib fractures. Fractures of ribs 3 through 7 on the right, segmental fracture of the right fifth rib. No displaced rib fractures on the left. Sternum is intact. Visualized clavicles and scapulae are unremarkable. Costochondral elements are intact  IMPRESSION: 1. Large right pneumothorax under tension with shift of mediastinal structures from right to left. 2. Multiple right-sided rib fractures, segmental fracture of the right fifth rib. 3. Cholelithiasis without evidence of acute cholecystitis. 4. Lobular hepatic contours with potential hepatic steatosis. 5. Aortic atherosclerosis. 6. A call is out to the referring provider to further discuss findings in the above case. Aortic Atherosclerosis (ICD10-I70.0). Electronically Signed: By: Zetta Bills M.D. On: 02/17/2020 13:41   DG CHEST PORT 1 VIEW  Result Date: 02/19/2020 CLINICAL DATA:  Chest tube.  History of pneumothorax. EXAM: PORTABLE CHEST 1 VIEW COMPARISON:  02/18/2020.  CT 02/17/2020. FINDINGS: Patient rotated to the right. Right chest tube noted and stable position. No pneumothorax. Heart size stable. Lungs are clear. No pneumothorax. Right rib fractures best identified by prior CT. IMPRESSION: 1.  Right chest tube in stable position.  No pneumothorax. 2.  Right rib fractures best identified by prior CT. Electronically Signed   By: Marcello Moores  Register   On: 02/19/2020 07:08   DG CHEST PORT 1 VIEW  Result Date: 02/18/2020 CLINICAL DATA:  Right chest tube EXAM: PORTABLE CHEST 1 VIEW COMPARISON:  02/17/2020 FINDINGS: Right-sided chest tube/pleural catheter projects minimally more superiorly today. Midline trachea. Normal heart size. Tortuous thoracic aorta. Atherosclerosis in the transverse aorta. No pleural effusion or pneumothorax. No lobar consolidation. Mild right hemidiaphragm elevation. IMPRESSION: Right-sided chest tube in place, without pneumothorax or acute disease. Aortic Atherosclerosis (ICD10-I70.0). Electronically Signed   By: Abigail Miyamoto M.D.   On: 02/18/2020 09:24   DG CHEST PORT 1 VIEW  Result Date: 02/17/2020 CLINICAL DATA:  Right-sided chest tube EXAM: PORTABLE CHEST 1 VIEW COMPARISON:  02/17/2020 FINDINGS: Right-sided chest tube. No definite residual pneumothorax. No pleural  effusion. No focal consolidation. Stable cardiomediastinal silhouette. No aggressive osseous lesion. IMPRESSION: Right-sided chest tube. No definite residual pneumothorax. Electronically Signed   By: Kathreen Devoid   On: 02/17/2020 14:14    Anti-infectives: Anti-infectives (From admission, onward)   Start     Dose/Rate Route Frequency Ordered Stop   02/17/20 0600  ceFAZolin (ANCEF) 3 g in dextrose 5 % 50 mL IVPB  Status:  Discontinued     3 g 100 mL/hr over 30 Minutes Intravenous On call to O.R. 02/16/20 1757 02/16/20 1948   02/17/20 0400  ceFAZolin (ANCEF) IVPB 2g/100 mL premix     2 g 200 mL/hr over 30 Minutes Intravenous Every 8 hours 02/17/20 0025 02/17/20 2100   02/16/20 2000  ceFAZolin (ANCEF) 3 g in dextrose 5 % 50 mL IVPB     3 g 100 mL/hr over 30 Minutes Intravenous On call to O.R. 02/16/20 1948 02/16/20 2120       Assessment/Plan Fall from ladder, 60ft Right 7th rib fx w/ tension PTX - CXR without PTX. No air leak. Place to [facility]. AM CXR.Multimodal pain control. Pulm toilet, IS Left Tibia Fx- Per Ortho. NWB.S/p I&D, closed reduction, external fixation 4/12. PT/OT. They plan for definitive fixation Monday or Tuesday  Right lower extremity laceration-Per Ortho. S/p I&D and closure in OR 4/12. WBAT HTN - Per TRH DM2 - Per TRH HLD - Per TRH C-Spine- Cleared  FEN -CM VTE -SCDs, Lovenox ID -Ancef peri-op.None currently.   LOS: 3 days    Jacinto Halim , [facility] 02/19/2020, 8:38 AM Please see Amion for pager number during day hours 7:00am-4:30pm

## 2020-02-19 NOTE — Progress Notes (Signed)
Physical Therapy Treatment Patient Details Name: Cameron Velasquez MRN: [id number] DOB: 11-27-1955 Today's Date: 02/19/2020    History of Present Illness Brittany Amirault is an 64 y.o. male hx of DM and HTN.  Admitted after a fall from ladder, 13ft, resulting in left tibia fx, right 7th rib fx, right LE laceration. S/p I&D, closed reduction, external fixation of LLE. s/p I and D of RLE laceration. Pt with R pneumothorax and is s/p chest tube insertion.      PT Comments    Pt making good progress towards his physical therapy goals today, requiring less assist and increased ease of transitional movements. Requiring min assist for stand pivot transfer using a walker. L ankle ROM appears to have improved as well; performed manual stretch prior to mobilizing. Will initiate gait training next session.    Follow Up Recommendations  CIR     Equipment Recommendations  Rolling walker with 5" wheels;3in1 (PT);Wheelchair (measurements PT);Other (comment)(elevating legrests)    Recommendations for Other Services       Precautions / Restrictions Precautions Precautions: Fall Precaution Comments: chest tube, ex fix LLE Restrictions Weight Bearing Restrictions: Yes RLE Weight Bearing: Weight bearing as tolerated LLE Weight Bearing: Non weight bearing    Mobility  Bed Mobility Overal bed mobility: Needs Assistance Bed Mobility: Supine to Sit     Supine to sit: Min assist     General bed mobility comments: MinA for LLE management, requiring less time to complete than yesterday's session  Transfers Overall transfer level: Needs assistance Equipment used: Rolling walker (2 wheeled) Transfers: Sit to/from [facility] Sit to Stand: Min assist Stand pivot transfers: Min assist       General transfer comment: MinA to power up from elevated bed surface and pivot towards right. Decreased control with eccentric descent  Ambulation/Gait                 Stairs              Wheelchair Mobility    Modified Rankin (Stroke Patients Only)       Balance                                            Cognition Arousal/Alertness: Awake/alert Behavior During Therapy: WFL for tasks assessed/performed Overall Cognitive Status: Within Functional Limits for tasks assessed                                        Exercises General Exercises - Lower Extremity Straight Leg Raises: Right;10 reps;Supine(with resistance) Other Exercises Other Exercises: Supine: RLE single leg bridge x 10, manual L ankle dorsiflexion stretch    General Comments        Pertinent Vitals/Pain Pain Assessment: Faces Faces Pain Scale: Hurts even more Pain Location: chest tube site Pain Descriptors / Indicators: Sore Pain Intervention(s): Limited activity within patient's tolerance;Monitored during session    Home Living                      Prior Function            PT Goals (current goals can now be found in the care plan section) Acute Rehab PT Goals Patient Stated Goal: to go home to his wife Potential to Achieve Goals: Good Progress  towards PT goals: Progressing toward goals    Frequency    Min 5X/week      PT Plan Current plan remains appropriate    Co-evaluation              AM-PAC PT "6 Clicks" Mobility   Outcome Measure  Help needed turning from your back to your side while in a flat bed without using bedrails?: A Little Help needed moving from lying on your back to sitting on the side of a flat bed without using bedrails?: A Little Help needed moving to and from a bed to a chair (including a wheelchair)?: A Little Help needed standing up from a chair using your arms (e.g., wheelchair or bedside chair)?: A Little Help needed to walk in hospital room?: A Lot Help needed climbing 3-5 steps with a railing? : Total 6 Click Score: 15    End of Session Equipment Utilized During Treatment: Gait  belt Activity Tolerance: Patient tolerated treatment well Patient left: in chair;with call bell/phone within reach;with chair alarm set   PT Visit Diagnosis: Unsteadiness on feet (R26.81);History of falling (Z91.81);Difficulty in walking, not elsewhere classified (R26.2)     Time: 7209-4709 PT Time Calculation (min) (ACUTE ONLY): 28 min  Charges:  $Therapeutic Exercise: 8-22 mins $Therapeutic Activity: 8-22 mins                       Lillia Pauls, PT, DPT Acute Rehabilitation Services Pager [phone] Office [phone]    Norval Morton 02/19/2020, 4:53 PM

## 2020-02-19 NOTE — Progress Notes (Signed)
Pts CBG this AM is 164, NT is having issues getting the monitor to transfer the data into the computer.

## 2020-02-19 NOTE — Progress Notes (Signed)
PROGRESS NOTE    Cameron Velasquez  FWY:[id number] DOB: 01/02/1956 DOA: 02/16/2020 PCP: Laurel Dimmer, FNP    Brief Narrative:  Cameron Velasquez is a 64 year old Caucasian male with past medical history remarkable for type 2 diabetes mellitus, essential hypertension, hyperlipidemia, GERD who was admitted to the trauma service following fall off of a ladder, roughly 15 feet high without loss of consciousness suffering multiple fractures and right-sided pneumothorax.  Trauma service requested Hospitalist assistance with medical comanagement.   Assessment & Plan:   Principal Problem:   Left tibial fracture Active Problems:   Leukocytosis   Essential hypertension   Diabetes mellitus type 2, controlled (HCC)   Fall from ladder   Acute renal failure: Resolved Creatinine peaked at 1.40 on 02/17/2020.  Suspect dehydration/prerenal azotemia.  Resolved with IV fluid hydration. --Cr 1.07-->0.90-->1.40-->1.02-->0.84 --Discontinue IV fluids today --Repeat BMP in a.m.  Type 2 diabetes mellitus Hemoglobin A1c 5.6, well controlled.  At home on Trulicity 1.5 mg Oil Trough q Sunday, glimepiride 4 mg p.o. daily, Metformin 1000 mg twice daily. --Holding oral hypoglycemics and Trulicity while inpatient --Insulin sliding scale for coverage while inpatient --CBGs 4 times daily qAC/HS  Essential hypertension BP 162/83 this morning --restart lisinopril 20mg  PO daily today --Hydralazine 25 mg p.o. every 8 hours prn for SBP >170 or DBP >110 --Continue aspirin and statin  HLD: Continue Crestor 10 mg p.o. daily  Right seventh rib fracture with tension pneumothorax Underwent right pigtail thoracostomy by Dr. on 02/17/2020.  Repeat chest x-ray this morning with right-sided chest tube in place without pneumothorax. --Placed on water seal today --Repeat CXR in a.m. --Incentive spirometry --Management per trauma surgery  Left bicondylar tibial plateau fracture Underwent external  fixation, nonweightbearing left lower extremity.  Orthopedics plans for definitive fixation Monday.  Traumatic wounds right/left legs Underwent I&D with closure by orthopedics.  Further management per Ortho. --Pain control with Tylenol, tramadol, morphine, Robaxin   DVT prophylaxis: Lovenox Code Status: Full code Family Communication:  Disposition Plan: Per primary, trauma service       Procedures:   Right pigtail tube thoracostomy - Dr. Wednesday 02/17/2020  Antimicrobials:   Perioperative cefazolin 4/12 - 4/13  Irrigation debridement open fracture with external fixation 4/12 - Dr. 6/12   Subjective: Patient seen and examined at bedside, resting comfortably.  Pain is controlled. Breathing improved. Chest tube placed on water seal this morning.  No specific complaints this morning.  Denies headache, no fever/chills/night sweats, no nausea/vomiting/diarrhea, no chest pain, no shortness of breath, no abdominal pain, no fatigue, no paresthesias.  No acute events overnight per nursing staff.  Objective: Vitals:   02/18/20 2040 02/19/20 0438 02/19/20 0636 02/19/20 0725  BP: (!) 172/90 (!) 164/87 (!) 162/83 (!) 156/79  Pulse:  91  90  Resp:  18 17 16   Temp:  98.3 F (36.8 C) 98.2 F (36.8 C) 98.5 F (36.9 C)  TempSrc:  Oral Oral Oral  SpO2:  93% 96% 97%  Weight:      Height:        Intake/Output Summary (Last 24 hours) at 02/19/2020 1317 Last data filed at 02/19/2020 0900 Gross per 24 hour  Intake 1200 ml  Output 1350 ml  Net -150 ml   Filed Weights   02/16/20 1029  Weight: 120.2 kg    Examination:  General exam: Appears calm and comfortable  Respiratory system: Clear to auscultation. Respiratory effort normal. Oxygenating well on room air.  Cardiovascular system: S1 & S2 heard, RRR. No JVD, murmurs, rubs,  gallops or clicks. No pedal edema. Gastrointestinal system: Abdomen is nondistended, soft and nontender. No organomegaly or masses felt. Normal bowel sounds  heard. Central nervous system: Alert and oriented. No focal neurological deficits. Extremities: Symmetric 5 x 5 power. Skin: No rashes, lesions or ulcers Psychiatry: Judgement and insight appear normal. Mood & affect appropriate.     Data Reviewed: I have personally reviewed following labs and imaging studies  CBC: Recent Labs  Lab 02/16/20 1024 02/16/20 1038 02/17/20 0419 02/18/20 0249 02/19/20 0453  WBC 10.6*  --  15.1* 11.4* 11.4*  HGB 14.8 14.3 11.7* 9.8* 9.5*  HCT 44.2 42.0 35.2* 29.0* 28.6*  MCV 90.4  --  91.2 90.9 90.5  PLT 177  --  197 153 174   Basic Metabolic Panel: Recent Labs  Lab 02/16/20 1024 02/16/20 1038 02/17/20 0419 02/18/20 0249 02/19/20 0453  NA 137 137 139 138 136  K 5.1 4.8 5.0 4.1 3.9  CL 104 105 105 105 101  CO2 18*  --  22 25 26   GLUCOSE 155* 152* 207* 185* 207*  BUN 18 22 24* 17 15  CREATININE 1.07 0.90 1.40* 1.02 0.84  CALCIUM 9.3  --  8.2* 8.2* 8.5*  PHOS  --   --   --  3.7  --    GFR: Estimated Creatinine Clearance: 115.7 mL/min (by C-G formula based on SCr of 0.84 mg/dL). Liver Function Tests: Recent Labs  Lab 02/16/20 1024 02/17/20 0419 02/18/20 0249  AST 37 28  --   ALT 21 18  --   ALKPHOS 56 43  --   BILITOT 1.2 0.8  --   PROT 6.7 6.0*  --   ALBUMIN 4.2 3.4* 3.2*   No results for input(s): LIPASE, AMYLASE in the last 168 hours. No results for input(s): AMMONIA in the last 168 hours. Coagulation Profile: Recent Labs  Lab 02/16/20 1119  INR 1.1   Cardiac Enzymes: Recent Labs  Lab 02/17/20 0947 02/18/20 0249  CKTOTAL 414* 504*   BNP (last 3 results) No results for input(s): PROBNP in the last 8760 hours. HbA1C: Recent Labs    02/16/20 1403  HGBA1C 5.6   CBG: Recent Labs  Lab 02/18/20 0637 02/18/20 1219 02/18/20 1644 02/18/20 2027 02/19/20 1151  GLUCAP 163* 189* 227* 154* 165*   Lipid Profile: No results for input(s): CHOL, HDL, LDLCALC, TRIG, CHOLHDL, LDLDIRECT in the last 72 hours. Thyroid  Function Tests: No results for input(s): TSH, T4TOTAL, FREET4, T3FREE, THYROIDAB in the last 72 hours. Anemia Panel: No results for input(s): VITAMINB12, FOLATE, FERRITIN, TIBC, IRON, RETICCTPCT in the last 72 hours. Sepsis Labs: Recent Labs  Lab 02/16/20 1024 02/16/20 1234 02/16/20 1540 02/18/20 0249  LATICACIDVEN 3.5* 2.3* 2.8* 1.0    Recent Results (from the past 240 hour(s))  Respiratory Panel by RT PCR (Flu A&B, Covid) - Nasopharyngeal Swab     Status: None   Collection Time: 02/16/20 10:48 AM   Specimen: Nasopharyngeal Swab  Result Value Ref Range Status   SARS Coronavirus 2 by RT PCR NEGATIVE NEGATIVE Final    Comment: (NOTE) SARS-CoV-2 target nucleic acids are NOT DETECTED. The SARS-CoV-2 RNA is generally detectable in upper respiratoy specimens during the acute phase of infection. The lowest concentration of SARS-CoV-2 viral copies this assay can detect is 131 copies/mL. A negative result does not preclude SARS-Cov-2 infection and should not be used as the sole basis for treatment or other patient management decisions. A negative result may occur with  improper specimen collection/handling,  submission of specimen other than nasopharyngeal swab, presence of viral mutation(s) within the areas targeted by this assay, and inadequate number of viral copies (<131 copies/mL). A negative result must be combined with clinical observations, patient history, and epidemiological information. The expected result is Negative. Fact Sheet for Patients:  PinkCheek.be Fact Sheet for Healthcare Providers:  GravelBags.it This test is not yet ap proved or cleared by the [place] FDA and  has been authorized for detection and/or diagnosis of SARS-CoV-2 by FDA under an Emergency Use Authorization (EUA). This EUA will remain  in effect (meaning this test can be used) for the duration of the COVID-19 declaration under Section  564(b)(1) of the Act, 21 U.S.C. section 360bbb-3(b)(1), unless the authorization is terminated or revoked sooner.    Influenza A by PCR NEGATIVE NEGATIVE Final   Influenza B by PCR NEGATIVE NEGATIVE Final    Comment: (NOTE) The Xpert Xpress SARS-CoV-2/FLU/RSV assay is intended as an aid in  the diagnosis of influenza from Nasopharyngeal swab specimens and  should not be used as a sole basis for treatment. Nasal washings and  aspirates are unacceptable for Xpert Xpress SARS-CoV-2/FLU/RSV  testing. Fact Sheet for Patients: PinkCheek.be Fact Sheet for Healthcare Providers: GravelBags.it This test is not yet approved or cleared by the [place] FDA and  has been authorized for detection and/or diagnosis of SARS-CoV-2 by  FDA under an Emergency Use Authorization (EUA). This EUA will remain  in effect (meaning this test can be used) for the duration of the  Covid-19 declaration under Section 564(b)(1) of the Act, 21  U.S.C. section 360bbb-3(b)(1), unless the authorization is  terminated or revoked. Performed at [facility], [place] [address], [place], [place] [address]   Surgical pcr screen     Status: None   Collection Time: 02/16/20  3:30 PM   Specimen: Nasal Mucosa; Nasal Swab  Result Value Ref Range Status   MRSA, PCR NEGATIVE NEGATIVE Final   Staphylococcus aureus NEGATIVE NEGATIVE Final    Comment: (NOTE) The Xpert SA Assay (FDA approved for NASAL specimens in patients 60 years of age and older), is one component of a comprehensive surveillance program. It is not intended to diagnose infection nor to guide or monitor treatment. Performed at [facility], [place] [address], [place], [place] [address]          Radiology Studies: DG CHEST PORT 1 VIEW  Result Date: 02/19/2020 CLINICAL DATA:  Chest tube.  History of pneumothorax. EXAM: PORTABLE CHEST 1 VIEW COMPARISON:  02/18/2020.  CT 02/17/2020.  FINDINGS: Patient rotated to the right. Right chest tube noted and stable position. No pneumothorax. Heart size stable. Lungs are clear. No pneumothorax. Right rib fractures best identified by prior CT. IMPRESSION: 1.  Right chest tube in stable position.  No pneumothorax. 2.  Right rib fractures best identified by prior CT. Electronically Signed   By: Marcello Moores  Register   On: 02/19/2020 07:08   DG CHEST PORT 1 VIEW  Result Date: 02/18/2020 CLINICAL DATA:  Right chest tube EXAM: PORTABLE CHEST 1 VIEW COMPARISON:  02/17/2020 FINDINGS: Right-sided chest tube/pleural catheter projects minimally more superiorly today. Midline trachea. Normal heart size. Tortuous thoracic aorta. Atherosclerosis in the transverse aorta. No pleural effusion or pneumothorax. No lobar consolidation. Mild right hemidiaphragm elevation. IMPRESSION: Right-sided chest tube in place, without pneumothorax or acute disease. Aortic Atherosclerosis (ICD10-I70.0). Electronically Signed   By: Abigail Miyamoto M.D.   On: 02/18/2020 09:24   DG CHEST PORT 1 VIEW  Result Date: 02/17/2020 CLINICAL DATA:  Right-sided chest tube EXAM: PORTABLE CHEST 1 VIEW COMPARISON:  02/17/2020 FINDINGS: Right-sided chest tube. No definite residual pneumothorax. No pleural effusion. No focal consolidation. Stable cardiomediastinal silhouette. No aggressive osseous lesion. IMPRESSION: Right-sided chest tube. No definite residual pneumothorax. Electronically Signed   By: Elige Ko   On: 02/17/2020 14:14        Scheduled Meds: . acetaminophen  500 mg Oral Q12H  . vitamin C  500 mg Oral Daily  . aspirin  81 mg Oral Daily  . cholecalciferol  2,000 Units Oral BID  . docusate sodium  100 mg Oral BID  . enoxaparin (LOVENOX) injection  40 mg Subcutaneous Q24H  . gabapentin  300 mg Oral QHS  . insulin aspart  0-9 Units Subcutaneous TID WC  . lisinopril  20 mg Oral Daily  . pantoprazole  20 mg Oral Daily  . polyethylene glycol  17 g Oral Daily  . rosuvastatin   10 mg Oral QHS   Continuous Infusions: . lactated ringers Stopped (02/17/20 0100)  . methocarbamol (ROBAXIN) IV       LOS: 3 days    Time spent: 32 minutes spent on chart review, discussion with nursing staff, consultants, updating family and interview/physical exam; more than 50% of that time was spent in counseling and/or coordination of care.    Alvira Philips [place], DO [facility] Available via Epic secure chat 7am-7pm After these hours, please refer to coverage provider listed on [email or web address] 02/19/2020, 1:17 PM

## 2020-02-19 NOTE — Plan of Care (Addendum)
Pt had a short, random run of VT last night and a couple of hours ago. Telemetry monitoring called and reported HR in the 130's. Pt not experiencing any other symptoms. Montez Morita, PA and on-call Trauma MD notified. 12 Lead EKG ordered from PA, pending to hear back from Trauma MD. Will continue to monitor.  Problem: Education: Goal: Knowledge of General Education information will improve Description: Including pain rating scale, medication(s)/side effects and non-pharmacologic comfort measures Outcome: Progressing   Problem: Health Behavior/Discharge Planning: Goal: Ability to manage health-related needs will improve Outcome: Progressing   Problem: Clinical Measurements: Goal: Will remain free from infection Outcome: Progressing Goal: Respiratory complications will improve Outcome: Progressing Goal: Cardiovascular complication will be avoided Outcome: Progressing   Problem: Pain Managment: Goal: General experience of comfort will improve Outcome: Progressing   Problem: Safety: Goal: Ability to remain free from injury will improve Outcome: Progressing   Problem: Skin Integrity: Goal: Risk for impaired skin integrity will decrease Outcome: Progressing

## 2020-02-20 ENCOUNTER — Inpatient Hospital Stay (HOSPITAL_COMMUNITY): Payer: Medicare Other

## 2020-02-20 LAB — BASIC METABOLIC PANEL
Anion gap: 10 (ref 5–15)
BUN: 19 mg/dL (ref 8–23)
CO2: 26 mmol/L (ref 22–32)
Calcium: 8.7 mg/dL — ABNORMAL LOW (ref 8.9–10.3)
Chloride: 101 mmol/L (ref 98–111)
Creatinine, Ser: 0.91 mg/dL (ref 0.61–1.24)
GFR calc Af Amer: >60 mL/min (ref >60)
GFR calc non Af Amer: >60 mL/min (ref >60)
Glucose, Bld: 201 mg/dL — ABNORMAL HIGH (ref 70–99)
Potassium: 3.7 mmol/L (ref 3.5–5.1)
Sodium: 137 mmol/L (ref 135–145)

## 2020-02-20 LAB — GLUCOSE, CAPILLARY
Glucose-Capillary: 174 mg/dL — ABNORMAL HIGH (ref 70–99)
Glucose-Capillary: 196 mg/dL — ABNORMAL HIGH (ref 70–99)
Glucose-Capillary: 232 mg/dL — ABNORMAL HIGH (ref 70–99)
Glucose-Capillary: 167 mg/dL — ABNORMAL HIGH (ref 70–99)

## 2020-02-20 MED ORDER — ADULT MULTIVITAMIN W/MINERALS CH
1.0000 | ORAL_TABLET | Freq: Every day | ORAL | Status: DC
  Administered 2020-02-20 – 2020-02-26 (×6): 1 via ORAL
  Filled 2020-02-20 (×6): qty 1

## 2020-02-20 MED ORDER — MAGNESIUM OXIDE 400 (241.3 MG) MG PO TABS
400.0000 mg | ORAL_TABLET | Freq: Two times a day (BID) | ORAL | Status: AC
  Administered 2020-02-20 – 2020-02-21 (×4): 400 mg via ORAL
  Filled 2020-02-20 (×4): qty 1

## 2020-02-20 NOTE — Progress Notes (Signed)
02/20/20 1803  PT Visit Information  Last PT Received On 02/20/20  Assistance Needed +1  History of Present Illness Cameron Velasquez is an 64 y.o. male hx of DM and HTN.  Admitted after a fall from ladder, 33ft, resulting in left tibia fx, right 7th rib fx, right LE laceration. S/p I&D, closed reduction, external fixation of LLE. s/p I and D of RLE laceration. Pt with R pneumothorax and is s/p chest tube insertion.    Subjective Data  Patient Stated Goal to go to CIR   Precautions  Precautions Fall  Precaution Comments Ex -Fix Lt LE   Restrictions  Weight Bearing Restrictions Yes  RLE Weight Bearing WBAT  LLE Weight Bearing NWB  Pain Assessment  Pain Assessment Faces  Faces Pain Scale 6  Pain Location Lt LE   Pain Descriptors / Indicators Aching;Sore  Pain Intervention(s) Limited activity within patient's tolerance;Monitored during session;Repositioned  Cognition  Arousal/Alertness Awake/alert  Behavior During Therapy WFL for tasks assessed/performed  Overall Cognitive Status Within Functional Limits for tasks assessed  Bed Mobility  Overal bed mobility Needs Assistance  Bed Mobility Supine to Sit;Sit to Supine  Supine to sit Min assist  Sit to supine Min assist  General bed mobility comments Min A for LLE management.   Transfers  Overall transfer level Needs assistance  Equipment used Rolling walker (2 wheeled)  Transfers Sit to/from [facility]  Sit to Stand Min assist  Stand pivot transfers Min assist  General transfer comment Min A for lift assist to stand using RW. Able to take hop steps to and from [facility] this session. Increased pain with hopping, but pt tolerated well.   Balance  Overall balance assessment Needs assistance  Sitting-balance support Feet supported  Sitting balance-Leahy Scale Good  Standing balance support Bilateral upper extremity supported  Standing balance-Leahy Scale Poor  Standing balance comment Reliant on UE and external support    PT - End of Session  Equipment Utilized During Treatment Gait belt  Activity Tolerance Patient tolerated treatment well  Patient left in bed;with call bell/phone within reach  Nurse Communication Mobility status   PT - Assessment/Plan  PT Plan Current plan remains appropriate  PT Visit Diagnosis Unsteadiness on feet (R26.81);History of falling (Z91.81);Difficulty in walking, not elsewhere classified (R26.2)  PT Frequency (ACUTE ONLY) Min 5X/week  Recommendations for Other Services Rehab consult  Follow Up Recommendations CIR  PT equipment Rolling walker with 5" wheels  AM-PAC PT "6 Clicks" Mobility Outcome Measure (Version 2)  Help needed turning from your back to your side while in a flat bed without using bedrails? 3  Help needed moving from lying on your back to sitting on the side of a flat bed without using bedrails? 3  Help needed moving to and from a bed to a chair (including a wheelchair)? 3  Help needed standing up from a chair using your arms (e.g., wheelchair or bedside chair)? 3  Help needed to walk in hospital room? 2  Help needed climbing 3-5 steps with a railing?  1  6 Click Score 15  Consider Recommendation of Discharge To: CIR/SNF/LTACH  PT Goal Progression  Progress towards PT goals Progressing toward goals  Acute Rehab PT Goals  PT Goal Formulation With patient  Time For Goal Achievement 03/02/20  Potential to Achieve Goals Good  PT Time Calculation  PT Start Time (ACUTE ONLY) 1556  PT Stop Time (ACUTE ONLY) 1616  PT Time Calculation (min) (ACUTE ONLY) 20 min  PT  General Charges  $$ ACUTE PT VISIT 1 Visit  PT Treatments  $Therapeutic Activity 8-22 mins   Pt progressing towards goals. Able to take hop steps this session to and from [facility]. Pt requiring min A for steadying assist. Feel he continues to be appropriate for CIR level therapies. Will continue to follow acutely to maximize functional mobility independence and safety.   Farley Ly, PT, DPT  Acute  Rehabilitation Services  Pager: [phone] Office: [phone]

## 2020-02-20 NOTE — Discharge Instructions (Signed)
PNEUMOTHORAX OR HEMOTHORAX +/- RIB FRACTURES  HOME INSTRUCTIONS   1. PAIN CONTROL:  1. Pain is best controlled by a usual combination of three different methods TOGETHER:  i. Ice/Heat ii. Over the counter pain medication iii. Prescription pain medication 2. You may experience some swelling and bruising in area of broken ribs. Ice packs or heating pads (30-60 minutes up to 6 times a day) will help. Use ice for the first few days to help decrease swelling and bruising, then switch to heat to help relax tight/sore spots and speed recovery. Some people prefer to use ice alone, heat alone, alternating between ice & heat. Experiment to what works for you. Swelling and bruising can take several weeks to resolve.  3. It is helpful to take an over-the-counter pain medication regularly for the first few weeks. Choose one of the following that works best for you:  i. Naproxen (Aleve, etc) Two 220mg tabs twice a day ii. Ibuprofen (Advil, etc) Three 200mg tabs four times a day (every meal & bedtime) iii. Acetaminophen (Tylenol, etc) 500-650mg four times a day (every meal & bedtime) 4. A prescription for pain medication (such as oxycodone, hydrocodone, etc) may be given to you upon discharge. Take your pain medication as prescribed.  i. If you are having problems/concerns with the prescription medicine (does not control pain, nausea, vomiting, rash, itching, etc), please call us [phone] to see if we need to switch you to a different pain medicine that will work better for you and/or control your side effect better. ii. If you need a refill on your pain medication, please contact your pharmacy. They will contact our office to request authorization. Prescriptions will not be filled after 5 pm or on week-ends. 1. Avoid getting constipated. When taking pain medications, it is common to experience some constipation. Increasing fluid intake and taking a fiber supplement (such as Metamucil, Citrucel, FiberCon,  MiraLax, etc) 1-2 times a day regularly will usually help prevent this problem from occurring. A mild laxative (prune juice, Milk of Magnesia, MiraLax, etc) should be taken according to package directions if there are no bowel movements after 48 hours.  2. Watch out for diarrhea. If you have many loose bowel movements, simplify your diet to bland foods & liquids for a few days. Stop any stool softeners and decrease your fiber supplement. Switching to mild anti-diarrheal medications (Kayopectate, Pepto Bismol) can help. If this worsens or does not improve, please call us. 3. Chest tube site wound: you may remove the dressing from your chest tube site 3 days after the removal of your chest tube. DO NOT shower over the dressing. Once   removed, you may shower as normal. Do not submerge your wound in water for 2-3 weeks.  4. FOLLOW UP  a. Please call our office to set up or confirm an appointment for follow up for 2 weeks after discharge. You will need to get a chest xray at either [facility] or [facility]. This will be outlined in your follow up instructions. Please call [facility] at [phone] if you have any questions about follow up.  b. If you have any orthopedic or other injuries you will need to follow up as outlined in your follow up instructions.   WHEN TO CALL US [phone]:  1. Poor pain control 2. Reactions / problems with new medications (rash/itching, nausea, etc)  3. Fever over 101.5 F (38.5 C) 4. Worsening swelling or bruising 5. Redness, drainage, pain or swelling around chest   tube site 6. Worsening pain, productive cough, difficulty breathing or any other concerning symptoms  The clinic staff is available to answer your questions during regular business hours (8:30am-5pm). Please don't hesitate to call and ask to speak to one of our nurses for clinical concerns.  If you have a medical emergency, go to the nearest emergency room or call 911.  A surgeon from [facility] is always on call at the hospitals   [facility]  [address], [address], [place], [place] [address] ?  MAIN: [phone] ? TOLL FREE: [phone] ?  FAX [phone]  [email or web address]      Information on Rib Fractures  A rib fracture is a break or crack in one of the bones of the ribs. The ribs are long, curved bones that wrap around your chest and attach to your spine and your breastbone. The ribs protect your heart, lungs, and other organs in the chest. A broken or cracked rib is often painful but is not usually serious. Most rib fractures heal on their own over time. However, rib fractures can be more serious if multiple ribs are broken or if broken ribs move out of place and push against other structures or organs. What are the causes? This condition is caused by:  Repetitive movements with high force, such as pitching a baseball or having severe coughing spells.  A direct blow to the chest, such as a sports injury, a car accident, or a fall.  Cancer that has spread to the bones, which can weaken bones and cause them to break. What are the signs or symptoms? Symptoms of this condition include:  Pain when you breathe in or cough.  Pain when someone presses on the injured area.  Feeling short of breath. How is this diagnosed? This condition is diagnosed with a physical exam and medical history. Imaging tests may also be done, such as:  Chest X-ray.  CT scan.  MRI.  Bone scan.  Chest ultrasound. How is this treated? Treatment for this condition depends on the severity of the fracture. Most rib fractures usually heal on their own in 1-3 months. Sometimes healing takes longer if there is a cough that does not stop or if there are other activities that make the injury worse (aggravating factors). While you heal, you will be given medicines to control the pain. You will also be taught deep breathing  exercises. Severe injuries may require hospitalization or surgery. Follow these instructions at home: Managing pain, stiffness, and swelling  If directed, apply ice to the injured area. ? Put ice in a plastic bag. ? Place a towel between your skin and the bag. ? Leave the ice on for 20 minutes, 2-3 times a day.  Take over-the-counter and prescription medicines only as told by your health care provider. Activity  Avoid a lot of activity and any activities or movements that cause pain. Be careful during activities and avoid bumping the injured rib.  Slowly increase your activity as told by your health care provider. General instructions  Do deep breathing exercises as told by your health care provider. This helps prevent pneumonia, which is a common complication of a broken rib. Your health care provider may instruct you to: ? Take deep breaths several times a day. ? Try to cough several times a day, holding a pillow against the injured area. ? Use a device called incentive spirometer to practice deep breathing several times a day.  Drink enough   fluid to keep your urine pale yellow.  Do not wear a rib belt or binder. These restrict breathing, which can lead to pneumonia.  Keep all follow-up visits as told by your health care provider. This is important. Contact a health care provider if:  You have a fever. Get help right away if:  You have difficulty breathing or you are short of breath.  You develop a cough that does not stop, or you cough up thick or bloody sputum.  You have nausea, vomiting, or pain in your abdomen.  Your pain gets worse and medicine does not help. Summary  A rib fracture is a break or crack in one of the bones of the ribs.  A broken or cracked rib is often painful but is not usually serious.  Most rib fractures heal on their own over time.  Treatment for this condition depends on the severity of the fracture.  Avoid a lot of activity and any  activities or movements that cause pain. This information is not intended to replace advice given to you by your health care provider. Make sure you discuss any questions you have with your health care provider. Document Released: 10/23/2005 Document Revised: 01/22/2017 Document Reviewed: 01/22/2017 Elsevier Interactive Patient Education  2019 [employer]    Pneumothorax A pneumothorax is commonly called a collapsed lung. It is a condition in which air leaks from a lung and builds up between the thin layer of tissue that covers the lungs (visceral pleura) and the interior wall of the chest cavity (parietal pleura). The air gets trapped outside the lung, between the lung and the chest wall (pleural space). The air takes up space and prevents the lung from fully expanding. This condition sometimes occurs suddenly with no apparent cause. The buildup of air may be small or large. A small pneumothorax may go away on its own. A large pneumothorax will require treatment and hospitalization. What are the causes? This condition may be caused by:  Trauma and injury to the chest wall.  Surgery and other medical procedures.  A complication of an underlying lung problem, especially chronic obstructive pulmonary disease (COPD) or emphysema. Sometimes the cause of this condition is not known. What increases the risk? You are more likely to develop this condition if:  You have an underlying lung problem.  You smoke.  You are 20-40 years old, male, tall, and underweight.  You have a personal or family history of pneumothorax.  You have an eating disorder (anorexia nervosa). This condition can also happen quickly, even in people with no history of lung problems. What are the signs or symptoms? Sometimes a pneumothorax will have no symptoms. When symptoms are present, they can include:  Chest pain.  Shortness of breath.  Increased rate of breathing.  Bluish color to your lips or skin  (cyanosis). How is this diagnosed? This condition may be diagnosed by:  A medical history and physical exam.  A chest X-ray, chest CT scan, or ultrasound. How is this treated? Treatment depends on how severe your condition is. The goal of treatment is to remove the extra air and allow your lung to expand back to its normal size.  For a small pneumothorax: ? No treatment may be needed. ? Extra oxygen is sometimes used to make it go away more quickly.  For a large pneumothorax or a pneumothorax that is causing symptoms, a procedure is done to drain the air from your lungs. To do this, a health care provider may   use: ? A needle with a syringe. This is used to suck air from a pleural space where no additional leakage is taking place. ? A chest tube. This is used to suck air where there is ongoing leakage into the pleural space. The chest tube may need to remain in place for several days until the air leak has healed.  In more severe cases, surgery may be needed to repair the damage that is causing the leak.  If you have multiple pneumothorax episodes or have an air leak that will not heal, a procedure called a pleurodesis may be done. A medicine is placed in the pleural space to irritate the tissues around the lung so that the lung will stick to the chest wall, seal any leaks, and stop any buildup of air in that space. If you have an underlying lung problem, severe symptoms, or a large pneumothorax you will usually need to stay in the hospital. Follow these instructions at home: Lifestyle  Do not use any products that contain nicotine or tobacco, such as cigarettes and e-cigarettes. These are major risk factors in pneumothorax. If you need help quitting, ask your health care provider.  Do not lift anything that is heavier than 10 lb (4.5 kg), or the limit that your health care provider tells you, until he or she says that it is safe.  Avoid activities that take a lot of effort (strenuous)  for as long as told by your health care provider.  Return to your normal activities as told by your health care provider. Ask your health care provider what activities are safe for you.  Do not fly in an airplane or scuba dive until your health care provider says it is okay. General instructions  Take over-the-counter and prescription medicines only as told by your health care provider.  If a cough or pain makes it difficult for you to sleep at night, try sleeping in a semi-upright position in a recliner or by using 2 or 3 pillows.  If you had a chest tube and it was removed, ask your health care provider when you can remove the bandage (dressing). While the dressing is in place, do not allow it to get wet.  Keep all follow-up visits as told by your health care provider. This is important. Contact a health care provider if:  You cough up thick mucus (sputum) that is yellow or green in color.  You were treated with a chest tube, and you have redness, increasing pain, or discharge at the site where it was placed. Get help right away if:  You have increasing chest pain or shortness of breath.  You have a cough that will not go away.  You begin coughing up blood.  You have pain that is getting worse or is not controlled with medicines.  The site where your chest tube was located opens up.  You feel air coming out of the site where the chest tube was placed.  You have a fever or persistent symptoms for more than 2-3 days.  You have a fever and your symptoms suddenly get worse. These symptoms may represent a serious problem that is an emergency. Do not wait to see if the symptoms will go away. Get medical help right away. Call your local emergency services (911 in the [place] Do not drive yourself to the hospital. Summary  A pneumothorax, commonly called a collapsed lung, is a condition in which air leaks from a lung and gets trapped between the   lung and the chest wall (pleural  space).  The buildup of air may be small or large. A small pneumothorax may go away on its own. A large pneumothorax will require treatment and hospitalization.  Treatment for this condition depends on how severe the pneumothorax is. The goal of treatment is to remove the extra air and allow the lung to expand back to its normal size. This information is not intended to replace advice given to you by your health care provider. Make sure you discuss any questions you have with your health care provider. Document Released: 10/23/2005 Document Revised: 10/01/2017 Document Reviewed: 10/01/2017 Elsevier Interactive Patient Education  2019 Elsevier Inc.   

## 2020-02-20 NOTE — Progress Notes (Signed)
4 Days Post-Op  Subjective: CC: Doing well. Less pain over ribs. On room air. Pulling 2500 on IS. No SOB.   Objective: Vital signs in last 24 hours: Temp:  [97.5 F (36.4 C)-98.6 F (37 C)] 97.5 F (36.4 C) (04/16 0308) Pulse Rate:  [76-99] 76 (04/16 0308) Resp:  [17] 17 (04/16 0308) BP: (135-145)/(88-98) 135/88 (04/16 0308) SpO2:  [93 %-99 %] 93 % (04/16 0308) Last BM Date: 02/19/20  Intake/Output from previous day: 04/15 0701 - 04/16 0700 In: 960 [P.O.:960] Out: 1300 [Urine:1300] Intake/Output this shift: No intake/output data recorded.  PE: Gen: Alert, NAD, pleasant Card: RRR, no M/G/R heard Pulm:CT in place without air leak. Minimal bloody output in cannister.CTAB, no W/R/R, effort normal. 2500 on IS. Abd: Soft, NT/ND, +BS Ext:RLE with ace bandage in place. Wiggles toes of RLE. SILT. LLE with ace bandage in place and ex fix.Wiggles toes of LLE. SILT. Cap refill <2 seconds b/l.  Psych: A&Ox3  Skin: no rashes noted, warm and dry  Lab Results:  Recent Labs    02/18/20 0249 02/19/20 0453  WBC 11.4* 11.4*  HGB 9.8* 9.5*  HCT 29.0* 28.6*  PLT 153 174   BMET Recent Labs    02/18/20 0249 02/18/20 0249 02/19/20 0453 02/19/20 1933  NA 138  --  136  --   K 4.1   < > 3.9 3.8  CL 105  --  101  --   CO2 25  --  26  --   GLUCOSE 185*  --  207*  --   BUN 17  --  15  --   CREATININE 1.02  --  0.84  --   CALCIUM 8.2*  --  8.5*  --    < > = values in this interval not displayed.   PT/INR No results for input(s): LABPROT, INR in the last 72 hours. CMP     Component Value Date/Time   NA 136 02/19/2020 0453   K 3.8 02/19/2020 1933   CL 101 02/19/2020 0453   CO2 26 02/19/2020 0453   GLUCOSE 207 (H) 02/19/2020 0453   BUN 15 02/19/2020 0453   CREATININE 0.84 02/19/2020 0453   CALCIUM 8.5 (L) 02/19/2020 0453   PROT 6.0 (L) 02/17/2020 0419   ALBUMIN 3.2 (L) 02/18/2020 0249   AST 28 02/17/2020 0419   ALT 18 02/17/2020 0419   ALKPHOS 43 02/17/2020  0419   BILITOT 0.8 02/17/2020 0419   GFRNONAA >60 02/19/2020 0453   GFRAA >60 02/19/2020 0453   Lipase  No results found for: LIPASE     Studies/Results: DG CHEST PORT 1 VIEW  Result Date: 02/20/2020 CLINICAL DATA:  64 year old male with history of pneumothorax status post right chest tube placement. EXAM: PORTABLE CHEST 1 VIEW COMPARISON:  Chest x-ray 02/19/2020. FINDINGS: Small bore right-sided chest tube projecting over the right mid hemithorax. No appreciable right pneumothorax. Lung volumes are normal. No consolidative airspace disease. No pleural effusions. No pneumothorax. No pulmonary nodule or mass noted. Pulmonary vasculature and the cardiomediastinal silhouette are within normal limits. Atherosclerosis in the thoracic aorta. IMPRESSION: 1. Right-sided chest tube is stable in position. 2. No pneumothorax. No radiographic evidence of acute cardiopulmonary disease. 3. Aortic atherosclerosis. Electronically Signed   By: Trudie Reed M.D.   On: 02/20/2020 08:04   DG CHEST PORT 1 VIEW  Result Date: 02/19/2020 CLINICAL DATA:  Chest tube.  History of pneumothorax. EXAM: PORTABLE CHEST 1 VIEW COMPARISON:  02/18/2020.  CT 02/17/2020. FINDINGS: Patient rotated  to the right. Right chest tube noted and stable position. No pneumothorax. Heart size stable. Lungs are clear. No pneumothorax. Right rib fractures best identified by prior CT. IMPRESSION: 1.  Right chest tube in stable position.  No pneumothorax. 2.  Right rib fractures best identified by prior CT. Electronically Signed   By: Marcello Moores  Register   On: 02/19/2020 07:08    Anti-infectives: Anti-infectives (From admission, onward)   Start     Dose/Rate Route Frequency Ordered Stop   02/17/20 0600  ceFAZolin (ANCEF) 3 g in dextrose 5 % 50 mL IVPB  Status:  Discontinued     3 g 100 mL/hr over 30 Minutes Intravenous On call to O.R. 02/16/20 1757 02/16/20 1948   02/17/20 0400  ceFAZolin (ANCEF) IVPB 2g/100 mL premix     2 g 200 mL/hr  over 30 Minutes Intravenous Every 8 hours 02/17/20 0025 02/17/20 2100   02/16/20 2000  ceFAZolin (ANCEF) 3 g in dextrose 5 % 50 mL IVPB     3 g 100 mL/hr over 30 Minutes Intravenous On call to O.R. 02/16/20 1948 02/16/20 2120       Assessment/Plan Fall from ladder, 26ft Right 7th rib fxw/ tension PTX- AM CXR pending. No air leak. CT on WS. Plan for removal pending no PTX which there does not appear to be on my read. Multimodal pain control. Pulm toilet, IS Left Tibia Fx- Per Ortho. NWB.S/p I&D, closed reduction, external fixation 4/12. PT/OT. They plan for definitive fixation Monday Right lower extremity laceration-Per Ortho. S/pI&D andclosure in OR 4/12. WBAT HTN - Per TRH DM2 - Per TRH HLD - Per TRH C-Spine- Cleared  FEN -CM VTE -SCDs, Lovenox ID -Ancef peri-op.None currently.   LOS: 4 days    Jillyn Ledger , [facility] 02/20/2020, 8:07 AM Please see Amion for pager number during day hours 7:00am-4:30pm

## 2020-02-20 NOTE — Progress Notes (Signed)
EKG Completed. Bath and full linen change given to Patient.

## 2020-02-20 NOTE — Progress Notes (Signed)
CT removed this AM. Follow up CXR without evidence of PTX. Trauma service will sign off. Please call back with any questions or concerns. Thank you for the consult. Follow up placed in patients chart.

## 2020-02-20 NOTE — Progress Notes (Signed)
Occupational Therapy Progress Note  Foot plate removed, and skin checked - no evidence of pressure.  Pt appears to be tolerating well.     02/20/20 1511  OT Visit Information  Last OT Received On 02/20/20  Assistance Needed +1  History of Present Illness Cameron Velasquez is an 64 y.o. male hx of DM and HTN.  Admitted after a fall from ladder, 67ft, resulting in left tibia fx, right 7th rib fx, right LE laceration. S/p I&D, closed reduction, external fixation of LLE. s/p I and D of RLE laceration. Pt with R pneumothorax and is s/p chest tube insertion.    Precautions  Precautions Fall  Precaution Comments Ex -Fix Lt LE   Pain Assessment  Faces Pain Scale 4  Pain Location Lt LE   Pain Descriptors / Indicators Aching;Sore  Cognition  Arousal/Alertness Awake/alert  Behavior During Therapy WFL for tasks assessed/performed  Overall Cognitive Status Within Functional Limits for tasks assessed  Upper Extremity Assessment  Upper Extremity Assessment Defer to OT evaluation  Lower Extremity Assessment  Lower Extremity Assessment Defer to PT evaluation  Restrictions  Weight Bearing Restrictions Yes  RLE Weight Bearing WBAT  LLE Weight Bearing NWB  Other Exercises  Other Exercises Pt reports foot plate fitting well, but he reports it felt good to remove it for a bit.  Foot plate was removed, and skin check performed.  no evidence of pressure noted. Foot plate reapplied   OT - End of Session  Activity Tolerance Patient tolerated treatment well  Patient left in bed;with call bell/phone within reach;with bed alarm set  OT Assessment/Plan  OT Plan Discharge plan remains appropriate  OT Visit Diagnosis Unsteadiness on feet (R26.81);Other abnormalities of gait and mobility (R26.89);Pain  Pain - Right/Left Right  Pain - part of body Leg  OT Frequency (ACUTE ONLY) Min 2X/week  Follow Up Recommendations CIR;Supervision/Assistance - 24 hour  OT Equipment 3 in 1 bedside commode  AM-PAC OT "6 Clicks"  Daily Activity Outcome Measure (Version 2)  Help from another person eating meals? 3  Help from another person taking care of personal grooming? 3  Help from another person toileting, which includes using toliet, bedpan, or urinal? 2  Help from another person bathing (including washing, rinsing, drying)? 2  Help from another person to put on and taking off regular upper body clothing? 3  Help from another person to put on and taking off regular lower body clothing? 2  6 Click Score 15  Acute Rehab OT Goals  Patient Stated Goal to go to CIR   OT Goal Formulation With patient  Time For Goal Achievement 03/05/20  Potential to Achieve Goals Good  OT Time Calculation  OT Start Time (ACUTE ONLY) 1454  OT Stop Time (ACUTE ONLY) 1503  OT Time Calculation (min) 9 min  OT General Charges  $OT Visit 1 Visit  OT Treatments  $Orthotics/Prosthetics Check 8-22 mins  Eber Jones., OTR/L Acute Rehabilitation Services Pager [phone] Office [phone]

## 2020-02-20 NOTE — Progress Notes (Signed)
Occupational Therapy Treatment Patient Details Name: Cameron Velasquez MRN: [id number] DOB: [birth year] Today's Date: 02/20/2020    History of present illness Cameron Velasquez is an 64 y.o. male hx of DM and HTN.  Admitted after a fall from ladder, 80ft, resulting in left tibia fx, right 7th rib fx, right LE laceration. S/p I&D, closed reduction, external fixation of LLE. s/p I and D of RLE laceration. Pt with R pneumothorax and is s/p chest tube insertion.     OT comments  Foot plate fabricated for Lt foot.  He was instructed in wear schedule, how to don/doff, and to monitor for signs of increased pain.    Follow Up Recommendations  CIR;Supervision/Assistance - 24 hour    Equipment Recommendations  3 in 1 bedside commode    Recommendations for Other Services      Precautions / Restrictions Precautions Precautions: Fall Precaution Comments: Ex -Fix Lt LE  Restrictions Weight Bearing Restrictions: Yes RLE Weight Bearing: Weight bearing as tolerated LLE Weight Bearing: Non weight bearing       Mobility Bed Mobility                  Transfers                      Balance                                           ADL either performed or assessed with clinical judgement   ADL                                               Vision       Perception     Praxis      Cognition Arousal/Alertness: Awake/alert Behavior During Therapy: WFL for tasks assessed/performed Overall Cognitive Status: Within Functional Limits for tasks assessed                                          Exercises Other Exercises Other Exercises: Foot plate fabricated for Lt foot. Pt was instructed in purpose and wear schedule for foot plate. Sensation of Lt foot apepars intact, and instructed pt to monitor for signs of increased pain of foot due to pressure  Other Exercises: Pt instructed how to loosen, adjust, and don/doff foot  plate    Shoulder Instructions       General Comments      Pertinent Vitals/ Pain       Pain Assessment: Faces Faces Pain Scale: Hurts little more Pain Location: Lt LE  Pain Descriptors / Indicators: Aching;Sore Pain Intervention(s): Monitored during session  Home Living                                          Prior Functioning/Environment              Frequency  Min 2X/week        Progress Toward Goals  OT Goals(current goals can now be found in the care plan section)  Progress towards OT goals: (  goal added )  Acute Rehab OT Goals Patient Stated Goal: to go to CIR  OT Goal Formulation: With patient Time For Goal Achievement: 03/05/20 Potential to Achieve Goals: Good  Plan Discharge plan remains appropriate    Co-evaluation                 AM-PAC OT "6 Clicks" Daily Activity     Outcome Measure   Help from another person eating meals?: A Little Help from another person taking care of personal grooming?: A Little Help from another person toileting, which includes using toliet, bedpan, or urinal?: A Lot Help from another person bathing (including washing, rinsing, drying)?: A Lot Help from another person to put on and taking off regular upper body clothing?: A Little Help from another person to put on and taking off regular lower body clothing?: A Lot 6 Click Score: 15    End of Session    OT Visit Diagnosis: Unsteadiness on feet (R26.81);Other abnormalities of gait and mobility (R26.89);Pain Pain - Right/Left: Right Pain - part of body: Leg   Activity Tolerance Patient tolerated treatment well   Patient Left in bed;with call bell/phone within reach;with bed alarm set   Nurse Communication          Time: 1610-9604 OT Time Calculation (min): 52 min  Charges: OT General Charges $OT Visit: 1 Visit OT Treatments $Orthotics Fit/Training: 38-52 mins  Cameron Velasquez., OTR/L Acute Rehabilitation Services Pager  [phone] Office [phone]    Cameron Velasquez 02/20/2020, 3:10 PM

## 2020-02-20 NOTE — Plan of Care (Signed)

## 2020-02-20 NOTE — Progress Notes (Addendum)
Orthopaedic Trauma Service Progress Note  Patient ID: Cameron Velasquez MRN: [id number] DOB/AGE: [birth year] 64 y.o.  Subjective:  Pain improved No complaints  No CP or SOB  Hopeful for CIR  Chest tube to be removed today    ROS As above  Objective:   VITALS:   Vitals:   02/19/20 1550 02/19/20 2019 02/20/20 0308 02/20/20 0834  BP:  (!) 141/92 135/88 (!) 150/70  Pulse: 99 88 76 90  Resp:  17 17 17   Temp:  98.3 F (36.8 C) (!) 97.5 F (36.4 C) 98.8 F (37.1 C)  TempSrc:  Oral Oral Oral  SpO2:  99% 93% 98%  Weight:      Height:        Estimated body mass index is 36.96 kg/m as calculated from the following:   Height as of this encounter: 5\' 11"  (1.803 m).   Weight as of this encounter: 120.2 kg.   Intake/Output      04/15 0701 - 04/16 0700 04/16 0701 - 04/17 0700   P.O. 960 240   Total Intake(mL/kg) 960 (8) 240 (2)   Urine (mL/kg/hr) 1300 (0.5)    Emesis/NG output     Drains     Stool 0    Chest Tube 0    Total Output 1300    Net -340 +240        Stool Occurrence 1 x      LABS  Results for orders placed or performed during the hospital encounter of 02/16/20 (from the past 24 hour(s))  Glucose, capillary     Status: Abnormal   Collection Time: 02/19/20 11:51 AM  Result Value Ref Range   Glucose-Capillary 165 (H) 70 - 99 mg/dL  Glucose, capillary     Status: Abnormal   Collection Time: 02/19/20  4:46 PM  Result Value Ref Range   Glucose-Capillary 158 (H) 70 - 99 mg/dL  Magnesium     Status: Abnormal   Collection Time: 02/19/20  7:33 PM  Result Value Ref Range   Magnesium 1.6 (L) 1.7 - 2.4 mg/dL  Potassium     Status: None   Collection Time: 02/19/20  7:33 PM  Result Value Ref Range   Potassium 3.8 3.5 - 5.1 mmol/L  Glucose, capillary     Status: Abnormal   Collection Time: 02/19/20  9:12 PM  Result Value Ref Range   Glucose-Capillary 202 (H) 70 - 99 mg/dL  Glucose,  capillary     Status: Abnormal   Collection Time: 02/20/20  6:26 AM  Result Value Ref Range   Glucose-Capillary 174 (H) 70 - 99 mg/dL  Basic metabolic panel     Status: Abnormal   Collection Time: 02/20/20  8:13 AM  Result Value Ref Range   Sodium 137 135 - 145 mmol/L   Potassium 3.7 3.5 - 5.1 mmol/L   Chloride 101 98 - 111 mmol/L   CO2 26 22 - 32 mmol/L   Glucose, Bld 201 (H) 70 - 99 mg/dL   BUN 19 8 - 23 mg/dL   Creatinine, Ser 02/22/20 0.61 - 1.24 mg/dL   Calcium 8.7 (L) 8.9 - 10.3 mg/dL   GFR calc non Af Amer >60 >60 mL/min   GFR calc Af Amer >60 >60 mL/min   Anion gap 10 5 - 15  PHYSICAL EXAM:   Gen: resting comfortably in bed, NAD, appears well  Lungs: unlabored  Cardiac: regular  Ext:  Ext:       Right Lower Extremity              prevena functioning well             Distal motor and sensory functions intact             Ext warm              + DP pulse             No pain with passive stretch         left Lower Extremity              Ex fix stable             pinsites look good              Dressings changed  Swelling much improved              Ext warm              Distal motor and sensory functions intact              No pain with passive stretch    Assessment/Plan: 4 Days Post-Op   Principal Problem:   Left tibial fracture Active Problems:   Leukocytosis   Essential hypertension   Diabetes mellitus type 2, controlled (HCC)   Fall from ladder   Anti-infectives (From admission, onward)   Start     Dose/Rate Route Frequency Ordered Stop   02/17/20 0600  ceFAZolin (ANCEF) 3 g in dextrose 5 % 50 mL IVPB  Status:  Discontinued     3 g 100 mL/hr over 30 Minutes Intravenous On call to O.R. 02/16/20 1757 02/16/20 1948   02/17/20 0400  ceFAZolin (ANCEF) IVPB 2g/100 mL premix     2 g 200 mL/hr over 30 Minutes Intravenous Every 8 hours 02/17/20 0025 02/17/20 2100   02/16/20 2000  ceFAZolin (ANCEF) 3 g in dextrose 5 % 50 mL IVPB     3 g 100 mL/hr over 30  Minutes Intravenous On call to O.R. 02/16/20 1948 02/16/20 2120    .  POD/HD#: 38  64 y/o male s/p fall off ladder >15 ft    -fall off ladder   - L bicondylar tibial plateau fracture s/p Ex fix              NWB L leg              Aggressive Ice and elevation              Heel cord stretching                          foot plate made              Therapy              Plan for OR Monday for definitive fixation at 1015             pin care as needed                Float heel off bed               - traumatic wound L leg s/p I&D and closure  Dressing changed today  Wound looks great    - traumatic wound R leg s/p I&D and closure             prevena in place              Will remove in OR Monday              WBAT R Leg             No ROM restrictions R hip, knee or ankle    - R PTX s/p chest tube              Per TS     - Pain management:             Continue with current regimen    - ABL anemia/Hemodynamics             Stable             Monitor              - Medical issues              Per medical team                           AKI                          appears improved                         Renal function looks to be at baseline                         Metformin and ACEI still on hold                         HTN                        ACE inhibitor restarted                DM                         Blood sugars have been looking good                             - DVT/PE prophylaxis:             lovenox  - ID:              abx completed    - Metabolic Bone Disease:             Vitamin d levels look good   - Activity:             WBAT R leg             NWB L leg   - FEN/GI prophylaxis/Foley/Lines:             Carb mod diet             IVF   -Ex-fix/Splint care:             Ok to manipulate L leg by ex fix              -  Impediments to fracture healing:             Severe soft tissue injury              High energy injury               DM   - Dispo:             Continue with inpatient care              OR Monday for definitive fixation   CIR eval    Mearl Latin, PA-C [phone] (C) 02/20/2020, 9:46 AM  [facility] [address] [place] [address] [phone] Collier Bullock (F)

## 2020-02-20 NOTE — Progress Notes (Signed)
PROGRESS NOTE    Cameron Velasquez  FTD:[id number] DOB: 28-Jun-1956 DOA: 02/16/2020 PCP: Laurel Dimmer, FNP    Brief Narrative:  Cameron Velasquez is a 64 year old Caucasian male with past medical history remarkable for type 2 diabetes mellitus, essential hypertension, hyperlipidemia, GERD who was admitted to the trauma service following fall off of a ladder, roughly 15 feet high without loss of consciousness suffering multiple fractures and right-sided pneumothorax.  Trauma service requested Hospitalist assistance with medical comanagement.   Assessment & Plan:   Principal Problem:   Left tibial fracture Active Problems:   Leukocytosis   Essential hypertension   Diabetes mellitus type 2, controlled (HCC)   Fall from ladder   Acute renal failure: Resolved Creatinine peaked at 1.40 on 02/17/2020.  Suspect dehydration/prerenal azotemia.  Resolved with IV fluid hydration.  ACE inhibitor now restarted. --Cr 1.07-->0.90-->1.40-->1.02-->0.84-->0.91 --Plan repeat BMP on Monday  Type 2 diabetes mellitus Hemoglobin A1c 5.6, well controlled.  At home on Trulicity 1.5 mg [place] q Sunday, glimepiride 4 mg p.o. daily, Metformin 1000 mg twice daily. --Holding oral hypoglycemics and Trulicity while inpatient --Required 6 units for total insulin coverage past 24 hours --Insulin sliding scale for coverage while inpatient; if requires greater than 10 units of coverage insulin, will consider long-acting insulin at that time --CBGs 4 times daily qAC/HS  Essential hypertension BP 135/88 this morning --restarted lisinopril 20mg  PO daily 4/15 --Hydralazine 25 mg p.o. every 8 hours prn for SBP >170 or DBP >110 --Continue aspirin and statin  HLD: Continue Crestor 10 mg p.o. daily  Right seventh rib fracture with tension pneumothorax Underwent right pigtail thoracostomy by Dr. 5/15 on 02/17/2020.  Repeat chest x-ray this morning with right-sided chest tube in place without pneumothorax.   Chest tube placed to waterseal on 02/19/2020, repeat x-ray this morning with chest tube in place with no pneumothorax.  Chest tube was removed this afternoon with repeat chest x-ray without any residual pneumothorax.  Trauma surgical now signed off with outpatient follow-up. --Incentive spirometry  Left bicondylar tibial plateau fracture Underwent external fixation, nonweightbearing left lower extremity.  Orthopedics plans for definitive fixation Monday.  Traumatic wounds right/left legs Underwent I&D with closure by orthopedics.  Further management per Ortho. --Pain control with Tylenol, tramadol, morphine, Robaxin   DVT prophylaxis: Lovenox Code Status: Full code Family Communication: Updated patient extensively at bedside Disposition Plan: Per primary, orthopedics       Procedures:   Right pigtail tube thoracostomy - Dr. Friday 02/17/2020  Antimicrobials:   Perioperative cefazolin 4/12 - 4/13  Irrigation debridement open fracture with external fixation 4/12 - Dr. 6/12   Subjective: Patient seen and examined at bedside, resting comfortably.  Chest tube removed this morning.  Shortness of breath resolved, breathing is at baseline.  Oxygenating well on room air.  Pain continues to be well controlled.  No specific complaints this morning.  Orthopedics plans definitive treatment of his fractures on Monday.  Denies headache, no fever/chills/night sweats, no nausea/vomiting/diarrhea, no chest pain, no shortness of breath, no abdominal pain, no fatigue, no paresthesias.  No acute events overnight per nursing staff.  Objective: Vitals:   02/19/20 2019 02/20/20 0308 02/20/20 0834 02/20/20 1358  BP: (!) 141/92 135/88 (!) 150/70 (!) 155/88  Pulse: 88 76 90 89  Resp: 17 17 17 17   Temp: 98.3 F (36.8 C) (!) 97.5 F (36.4 C) 98.8 F (37.1 C) 98.5 F (36.9 C)  TempSrc: Oral Oral Oral Oral  SpO2: 99% 93% 98% 99%  Weight:  Height:        Intake/Output Summary (Last 24 hours) at  02/20/2020 1447 Last data filed at 02/20/2020 0847 Gross per 24 hour  Intake 960 ml  Output 1100 ml  Net -140 ml   Filed Weights   02/16/20 1029  Weight: 120.2 kg    Examination:  General exam: Appears calm and comfortable  Respiratory system: Clear to auscultation. Respiratory effort normal. Oxygenating well on room air.  Cardiovascular system: S1 & S2 heard, RRR. No JVD, murmurs, rubs, gallops or clicks. No pedal edema. Gastrointestinal system: Abdomen is nondistended, soft and nontender. No organomegaly or masses felt. Normal bowel sounds heard. Central nervous system: Alert and oriented. No focal neurological deficits. Extremities: Symmetric 5 x 5 power. Skin: No rashes, lesions or ulcers Psychiatry: Judgement and insight appear normal. Mood & affect appropriate.     Data Reviewed: I have personally reviewed following labs and imaging studies  CBC: Recent Labs  Lab 02/16/20 1024 02/16/20 1038 02/17/20 0419 02/18/20 0249 02/19/20 0453  WBC 10.6*  --  15.1* 11.4* 11.4*  HGB 14.8 14.3 11.7* 9.8* 9.5*  HCT 44.2 42.0 35.2* 29.0* 28.6*  MCV 90.4  --  91.2 90.9 90.5  PLT 177  --  197 153 174   Basic Metabolic Panel: Recent Labs  Lab 02/16/20 1024 02/16/20 1024 02/16/20 1038 02/16/20 1038 02/17/20 0419 02/18/20 0249 02/19/20 0453 02/19/20 1933 02/20/20 0813  NA 137   < > 137  --  139 138 136  --  137  K 5.1   < > 4.8   < > 5.0 4.1 3.9 3.8 3.7  CL 104   < > 105  --  105 105 101  --  101  CO2 18*  --   --   --  22 25 26   --  26  GLUCOSE 155*   < > 152*  --  207* 185* 207*  --  201*  BUN 18   < > 22  --  24* 17 15  --  19  CREATININE 1.07   < > 0.90  --  1.40* 1.02 0.84  --  0.91  CALCIUM 9.3  --   --   --  8.2* 8.2* 8.5*  --  8.7*  MG  --   --   --   --   --   --   --  1.6*  --   PHOS  --   --   --   --   --  3.7  --   --   --    < > = values in this interval not displayed.   GFR: Estimated Creatinine Clearance: 106.8 mL/min (by C-G formula based on SCr of  0.91 mg/dL). Liver Function Tests: Recent Labs  Lab 02/16/20 1024 02/17/20 0419 02/18/20 0249  AST 37 28  --   ALT 21 18  --   ALKPHOS 56 43  --   BILITOT 1.2 0.8  --   PROT 6.7 6.0*  --   ALBUMIN 4.2 3.4* 3.2*   No results for input(s): LIPASE, AMYLASE in the last 168 hours. No results for input(s): AMMONIA in the last 168 hours. Coagulation Profile: Recent Labs  Lab 02/16/20 1119  INR 1.1   Cardiac Enzymes: Recent Labs  Lab 02/17/20 0947 02/18/20 0249  CKTOTAL 414* 504*   BNP (last 3 results) No results for input(s): PROBNP in the last 8760 hours. HbA1C: No results for input(s): HGBA1C in the last 72 hours.  CBG: Recent Labs  Lab 02/19/20 1151 02/19/20 1646 02/19/20 2112 02/20/20 0626 02/20/20 1137  GLUCAP 165* 158* 202* 174* 196*   Lipid Profile: No results for input(s): CHOL, HDL, LDLCALC, TRIG, CHOLHDL, LDLDIRECT in the last 72 hours. Thyroid Function Tests: No results for input(s): TSH, T4TOTAL, FREET4, T3FREE, THYROIDAB in the last 72 hours. Anemia Panel: No results for input(s): VITAMINB12, FOLATE, FERRITIN, TIBC, IRON, RETICCTPCT in the last 72 hours. Sepsis Labs: Recent Labs  Lab 02/16/20 1024 02/16/20 1234 02/16/20 1540 02/18/20 0249  LATICACIDVEN 3.5* 2.3* 2.8* 1.0    Recent Results (from the past 240 hour(s))  Respiratory Panel by RT PCR (Flu A&B, Covid) - Nasopharyngeal Swab     Status: None   Collection Time: 02/16/20 10:48 AM   Specimen: Nasopharyngeal Swab  Result Value Ref Range Status   SARS Coronavirus 2 by RT PCR NEGATIVE NEGATIVE Final    Comment: (NOTE) SARS-CoV-2 target nucleic acids are NOT DETECTED. The SARS-CoV-2 RNA is generally detectable in upper respiratoy specimens during the acute phase of infection. The lowest concentration of SARS-CoV-2 viral copies this assay can detect is 131 copies/mL. A negative result does not preclude SARS-Cov-2 infection and should not be used as the sole basis for treatment or other  patient management decisions. A negative result may occur with  improper specimen collection/handling, submission of specimen other than nasopharyngeal swab, presence of viral mutation(s) within the areas targeted by this assay, and inadequate number of viral copies (<131 copies/mL). A negative result must be combined with clinical observations, patient history, and epidemiological information. The expected result is Negative. Fact Sheet for Patients:  PinkCheek.be Fact Sheet for Healthcare Providers:  GravelBags.it This test is not yet ap proved or cleared by the [place] FDA and  has been authorized for detection and/or diagnosis of SARS-CoV-2 by FDA under an Emergency Use Authorization (EUA). This EUA will remain  in effect (meaning this test can be used) for the duration of the COVID-19 declaration under Section 564(b)(1) of the Act, 21 U.S.C. section 360bbb-3(b)(1), unless the authorization is terminated or revoked sooner.    Influenza A by PCR NEGATIVE NEGATIVE Final   Influenza B by PCR NEGATIVE NEGATIVE Final    Comment: (NOTE) The Xpert Xpress SARS-CoV-2/FLU/RSV assay is intended as an aid in  the diagnosis of influenza from Nasopharyngeal swab specimens and  should not be used as a sole basis for treatment. Nasal washings and  aspirates are unacceptable for Xpert Xpress SARS-CoV-2/FLU/RSV  testing. Fact Sheet for Patients: PinkCheek.be Fact Sheet for Healthcare Providers: GravelBags.it This test is not yet approved or cleared by the [place] FDA and  has been authorized for detection and/or diagnosis of SARS-CoV-2 by  FDA under an Emergency Use Authorization (EUA). This EUA will remain  in effect (meaning this test can be used) for the duration of the  Covid-19 declaration under Section 564(b)(1) of the Act, 21  U.S.C. section 360bbb-3(b)(1), unless  the authorization is  terminated or revoked. Performed at [facility], [place] [address] [place], [place] [address]   Surgical pcr screen     Status: None   Collection Time: 02/16/20  3:30 PM   Specimen: Nasal Mucosa; Nasal Swab  Result Value Ref Range Status   MRSA, PCR NEGATIVE NEGATIVE Final   Staphylococcus aureus NEGATIVE NEGATIVE Final    Comment: (NOTE) The Xpert SA Assay (FDA approved for NASAL specimens in patients 13 years of age and older), is one component of a comprehensive surveillance  program. It is not intended to diagnose infection nor to guide or monitor treatment. Performed at [facility], [address], [place], [place] [address]          Radiology Studies: DG CHEST PORT 1 VIEW  Result Date: 02/20/2020 CLINICAL DATA:  Status post chest tube removal. EXAM: PORTABLE CHEST 1 VIEW COMPARISON:  02/20/2020. FINDINGS: Normal sized heart. Clear lungs. The right chest tube has been removed. No pneumothorax. Lower thoracic spine degenerative changes. IMPRESSION: No acute abnormality.  No pneumothorax following chest tube removal. Electronically Signed   By: Beckie Salts M.D.   On: 02/20/2020 13:45   DG CHEST PORT 1 VIEW  Result Date: 02/20/2020 CLINICAL DATA:  64 year old male with history of pneumothorax status post right chest tube placement. EXAM: PORTABLE CHEST 1 VIEW COMPARISON:  Chest x-ray 02/19/2020. FINDINGS: Small bore right-sided chest tube projecting over the right mid hemithorax. No appreciable right pneumothorax. Lung volumes are normal. No consolidative airspace disease. No pleural effusions. No pneumothorax. No pulmonary nodule or mass noted. Pulmonary vasculature and the cardiomediastinal silhouette are within normal limits. Atherosclerosis in the thoracic aorta. IMPRESSION: 1. Right-sided chest tube is stable in position. 2. No pneumothorax. No radiographic evidence of acute cardiopulmonary disease. 3. Aortic atherosclerosis. Electronically  Signed   By: Trudie Reed M.D.   On: 02/20/2020 08:04   DG CHEST PORT 1 VIEW  Result Date: 02/19/2020 CLINICAL DATA:  Chest tube.  History of pneumothorax. EXAM: PORTABLE CHEST 1 VIEW COMPARISON:  02/18/2020.  CT 02/17/2020. FINDINGS: Patient rotated to the right. Right chest tube noted and stable position. No pneumothorax. Heart size stable. Lungs are clear. No pneumothorax. Right rib fractures best identified by prior CT. IMPRESSION: 1.  Right chest tube in stable position.  No pneumothorax. 2.  Right rib fractures best identified by prior CT. Electronically Signed   By: Maisie Fus  Register   On: 02/19/2020 07:08        Scheduled Meds:  acetaminophen  500 mg Oral Q12H   vitamin C  500 mg Oral Daily   aspirin  81 mg Oral Daily   cholecalciferol  2,000 Units Oral BID   docusate sodium  100 mg Oral BID   enoxaparin (LOVENOX) injection  40 mg Subcutaneous Q24H   gabapentin  300 mg Oral QHS   insulin aspart  0-9 Units Subcutaneous TID WC   lisinopril  20 mg Oral Daily   magnesium oxide  400 mg Oral BID   multivitamin with minerals  1 tablet Oral Daily   pantoprazole  20 mg Oral Daily   polyethylene glycol  17 g Oral Daily   rosuvastatin  10 mg Oral QHS   Continuous Infusions:  lactated ringers Stopped (02/17/20 0100)   methocarbamol (ROBAXIN) IV       LOS: 4 days    Time spent: 32 minutes spent on chart review, discussion with nursing staff, consultants, updating family and interview/physical exam; more than 50% of that time was spent in counseling and/or coordination of care.    Alvira Philips [place], DO [facility] Available via Epic secure chat 7am-7pm After these hours, please refer to coverage provider listed on [email or web address] 02/20/2020, 2:47 PM

## 2020-02-21 LAB — GLUCOSE, CAPILLARY
Glucose-Capillary: 173 mg/dL — ABNORMAL HIGH (ref 70–99)
Glucose-Capillary: 201 mg/dL — ABNORMAL HIGH (ref 70–99)
Glucose-Capillary: 169 mg/dL — ABNORMAL HIGH (ref 70–99)
Glucose-Capillary: 173 mg/dL — ABNORMAL HIGH (ref 70–99)

## 2020-02-21 NOTE — Progress Notes (Signed)
PROGRESS NOTE    Cameron Velasquez  OFB:[id number] DOB: Aug 18, 1956 DOA: 02/16/2020 PCP: Laurel Dimmer, FNP    Brief Narrative:  Cameron Velasquez is a 64 year old Caucasian male with past medical history remarkable for type 2 diabetes mellitus, essential hypertension, hyperlipidemia, GERD who was admitted to the trauma service following fall off of a ladder, roughly 15 feet high without loss of consciousness suffering multiple fractures and right-sided pneumothorax.  Trauma service requested Hospitalist assistance with medical comanagement.   Assessment & Plan:   Principal Problem:   Left tibial fracture Active Problems:   Leukocytosis   Essential hypertension   Diabetes mellitus type 2, controlled (HCC)   Fall from ladder   Acute renal failure: Resolved Creatinine peaked at 1.40 on 02/17/2020.  Suspect dehydration/prerenal azotemia.  Resolved with IV fluid hydration.  ACE inhibitor now restarted. --Cr 1.07-->0.90-->1.40-->1.02-->0.84-->0.91 --Plan repeat BMP on Monday  Type 2 diabetes mellitus Hemoglobin A1c 5.6, well controlled.  At home on Trulicity 1.5 mg [place] q Sunday, glimepiride 4 mg p.o. daily, Metformin 1000 mg twice daily. --Holding oral hypoglycemics and Trulicity while inpatient --Required 7 units for total insulin coverage past 24 hours --Insulin sliding scale for coverage while inpatient; if requires greater than 10 units of coverage insulin, will consider long-acting insulin at that time --CBGs 4 times daily qAC/HS  Essential hypertension --restarted lisinopril 20mg  PO daily 4/15 --Hydralazine 25 mg p.o. every 8 hours prn for SBP >170 or DBP >110 --Continue aspirin and statin  HLD: Continue Crestor 10 mg p.o. daily  Right seventh rib fracture with tension pneumothorax Underwent right pigtail thoracostomy by Dr. 5/15 on 02/17/2020.  Repeat chest x-ray this morning with right-sided chest tube in place without pneumothorax.  Chest tube placed to  waterseal on 02/19/2020, repeat x-ray 4/16 with chest tube in place with no pneumothorax.  Chest tube was removed 4/16 with repeat chest x-ray without any residual pneumothorax.  Trauma surgical now signed off with outpatient follow-up.  Patient breathing well without hypoxia, and no shortness of breath. --Incentive spirometry  Left bicondylar tibial plateau fracture Underwent external fixation, nonweightbearing left lower extremity.  Orthopedics plans for definitive fixation Monday.  Traumatic wounds right/left legs Underwent I&D with closure by orthopedics.  Further management per Ortho. --Pain control with Tylenol, tramadol, morphine, Robaxin   DVT prophylaxis: Lovenox Code Status: Full code Family Communication: Updated patient extensively at bedside Disposition Plan: Per primary, orthopedics; likely CIR versus SNF       Procedures:   Right pigtail tube thoracostomy - Dr. Wednesday 02/17/2020  Antimicrobials:   Perioperative cefazolin 4/12 - 4/13  Irrigation debridement open fracture with external fixation 4/12 - Dr. 6/12   Subjective: Patient seen and examined at bedside, resting comfortably.  In good spirits in bedside chair watching TV.  Pain controlled.  Breathing back to his normal baseline without any shortness of breath.  Oxygenating well on room air.  No complaints. Denies headache, no fever/chills/night sweats, no nausea/vomiting/diarrhea, no chest pain, no shortness of breath, no abdominal pain, no fatigue, no paresthesias.  No acute events overnight per nursing staff.  Objective: Vitals:   02/20/20 1358 02/20/20 1959 02/21/20 0317 02/21/20 0954  BP: (!) 155/88 (!) 158/70 (!) 164/83 (!) 152/87  Pulse: 89 94 86 85  Resp: 17   20  Temp: 98.5 F (36.9 C) 98.4 F (36.9 C) 98.4 F (36.9 C) 97.8 F (36.6 C)  TempSrc: Oral Oral Oral Oral  SpO2: 99% 96% 95% 99%  Weight:      Height:  No intake or output data in the 24 hours ending 02/21/20 1337 Filed Weights     02/16/20 1029  Weight: 120.2 kg    Examination:  General exam: Appears calm and comfortable  Respiratory system: Clear to auscultation. Respiratory effort normal. Oxygenating well on room air.  Cardiovascular system: S1 & S2 heard, RRR. No JVD, murmurs, rubs, gallops or clicks. No pedal edema. Gastrointestinal system: Abdomen is nondistended, soft and nontender. No organomegaly or masses felt. Normal bowel sounds heard. Central nervous system: Alert and oriented. No focal neurological deficits. Extremities: Symmetric 5 x 5 power.  Noted external fixation left lower extremity and wound VAC right lower extremity Skin: No rashes, lesions or ulcers Psychiatry: Judgement and insight appear normal. Mood & affect appropriate.     Data Reviewed: I have personally reviewed following labs and imaging studies  CBC: Recent Labs  Lab 02/16/20 1024 02/16/20 1038 02/17/20 0419 02/18/20 0249 02/19/20 0453  WBC 10.6*  --  15.1* 11.4* 11.4*  HGB 14.8 14.3 11.7* 9.8* 9.5*  HCT 44.2 42.0 35.2* 29.0* 28.6*  MCV 90.4  --  91.2 90.9 90.5  PLT 177  --  197 153 174   Basic Metabolic Panel: Recent Labs  Lab 02/16/20 1024 02/16/20 1024 02/16/20 1038 02/16/20 1038 02/17/20 0419 02/18/20 0249 02/19/20 0453 02/19/20 1933 02/20/20 0813  NA 137   < > 137  --  139 138 136  --  137  K 5.1   < > 4.8   < > 5.0 4.1 3.9 3.8 3.7  CL 104   < > 105  --  105 105 101  --  101  CO2 18*  --   --   --  22 25 26   --  26  GLUCOSE 155*   < > 152*  --  207* 185* 207*  --  201*  BUN 18   < > 22  --  24* 17 15  --  19  CREATININE 1.07   < > 0.90  --  1.40* 1.02 0.84  --  0.91  CALCIUM 9.3  --   --   --  8.2* 8.2* 8.5*  --  8.7*  MG  --   --   --   --   --   --   --  1.6*  --   PHOS  --   --   --   --   --  3.7  --   --   --    < > = values in this interval not displayed.   GFR: Estimated Creatinine Clearance: 106.8 mL/min (by C-G formula based on SCr of 0.91 mg/dL). Liver Function Tests: Recent Labs  Lab  02/16/20 1024 02/17/20 0419 02/18/20 0249  AST 37 28  --   ALT 21 18  --   ALKPHOS 56 43  --   BILITOT 1.2 0.8  --   PROT 6.7 6.0*  --   ALBUMIN 4.2 3.4* 3.2*   No results for input(s): LIPASE, AMYLASE in the last 168 hours. No results for input(s): AMMONIA in the last 168 hours. Coagulation Profile: Recent Labs  Lab 02/16/20 1119  INR 1.1   Cardiac Enzymes: Recent Labs  Lab 02/17/20 0947 02/18/20 0249  CKTOTAL 414* 504*   BNP (last 3 results) No results for input(s): PROBNP in the last 8760 hours. HbA1C: No results for input(s): HGBA1C in the last 72 hours. CBG: Recent Labs  Lab 02/20/20 1137 02/20/20 1642 02/20/20 2127 02/21/20 0655 02/21/20  1204  GLUCAP 196* 232* 167* 173* 201*   Lipid Profile: No results for input(s): CHOL, HDL, LDLCALC, TRIG, CHOLHDL, LDLDIRECT in the last 72 hours. Thyroid Function Tests: No results for input(s): TSH, T4TOTAL, FREET4, T3FREE, THYROIDAB in the last 72 hours. Anemia Panel: No results for input(s): VITAMINB12, FOLATE, FERRITIN, TIBC, IRON, RETICCTPCT in the last 72 hours. Sepsis Labs: Recent Labs  Lab 02/16/20 1024 02/16/20 1234 02/16/20 1540 02/18/20 0249  LATICACIDVEN 3.5* 2.3* 2.8* 1.0    Recent Results (from the past 240 hour(s))  Respiratory Panel by RT PCR (Flu A&B, Covid) - Nasopharyngeal Swab     Status: None   Collection Time: 02/16/20 10:48 AM   Specimen: Nasopharyngeal Swab  Result Value Ref Range Status   SARS Coronavirus 2 by RT PCR NEGATIVE NEGATIVE Final    Comment: (NOTE) SARS-CoV-2 target nucleic acids are NOT DETECTED. The SARS-CoV-2 RNA is generally detectable in upper respiratoy specimens during the acute phase of infection. The lowest concentration of SARS-CoV-2 viral copies this assay can detect is 131 copies/mL. A negative result does not preclude SARS-Cov-2 infection and should not be used as the sole basis for treatment or other patient management decisions. A negative result may occur  with  improper specimen collection/handling, submission of specimen other than nasopharyngeal swab, presence of viral mutation(s) within the areas targeted by this assay, and inadequate number of viral copies (<131 copies/mL). A negative result must be combined with clinical observations, patient history, and epidemiological information. The expected result is Negative. Fact Sheet for Patients:  https://www.moore.com/ Fact Sheet for Healthcare Providers:  https://www.young.biz/ This test is not yet ap proved or cleared by the [place] FDA and  has been authorized for detection and/or diagnosis of SARS-CoV-2 by FDA under an Emergency Use Authorization (EUA). This EUA will remain  in effect (meaning this test can be used) for the duration of the COVID-19 declaration under Section 564(b)(1) of the Act, 21 U.S.C. section 360bbb-3(b)(1), unless the authorization is terminated or revoked sooner.    Influenza A by PCR NEGATIVE NEGATIVE Final   Influenza B by PCR NEGATIVE NEGATIVE Final    Comment: (NOTE) The Xpert Xpress SARS-CoV-2/FLU/RSV assay is intended as an aid in  the diagnosis of influenza from Nasopharyngeal swab specimens and  should not be used as a sole basis for treatment. Nasal washings and  aspirates are unacceptable for Xpert Xpress SARS-CoV-2/FLU/RSV  testing. Fact Sheet for Patients: https://www.moore.com/ Fact Sheet for Healthcare Providers: https://www.young.biz/ This test is not yet approved or cleared by the [place] FDA and  has been authorized for detection and/or diagnosis of SARS-CoV-2 by  FDA under an Emergency Use Authorization (EUA). This EUA will remain  in effect (meaning this test can be used) for the duration of the  Covid-19 declaration under Section 564(b)(1) of the Act, 21  U.S.C. section 360bbb-3(b)(1), unless the authorization is  terminated or revoked. Performed  at [facility], [address] [place], [place] [address]   Surgical pcr screen     Status: None   Collection Time: 02/16/20  3:30 PM   Specimen: Nasal Mucosa; Nasal Swab  Result Value Ref Range Status   MRSA, PCR NEGATIVE NEGATIVE Final   Staphylococcus aureus NEGATIVE NEGATIVE Final    Comment: (NOTE) The Xpert SA Assay (FDA approved for NASAL specimens in patients 59 years of age and older), is one component of a comprehensive surveillance program. It is not intended to diagnose infection nor to guide or monitor treatment.  Performed at [facility], [place] [address], [place], [place] [address]          Radiology Studies: DG CHEST PORT 1 VIEW  Result Date: 02/20/2020 CLINICAL DATA:  Status post chest tube removal. EXAM: PORTABLE CHEST 1 VIEW COMPARISON:  02/20/2020. FINDINGS: Normal sized heart. Clear lungs. The right chest tube has been removed. No pneumothorax. Lower thoracic spine degenerative changes. IMPRESSION: No acute abnormality.  No pneumothorax following chest tube removal. Electronically Signed   By: Claudie Revering M.D.   On: 02/20/2020 13:45   DG CHEST PORT 1 VIEW  Result Date: 02/20/2020 CLINICAL DATA:  64 year old male with history of pneumothorax status post right chest tube placement. EXAM: PORTABLE CHEST 1 VIEW COMPARISON:  Chest x-ray 02/19/2020. FINDINGS: Small bore right-sided chest tube projecting over the right mid hemithorax. No appreciable right pneumothorax. Lung volumes are normal. No consolidative airspace disease. No pleural effusions. No pneumothorax. No pulmonary nodule or mass noted. Pulmonary vasculature and the cardiomediastinal silhouette are within normal limits. Atherosclerosis in the thoracic aorta. IMPRESSION: 1. Right-sided chest tube is stable in position. 2. No pneumothorax. No radiographic evidence of acute cardiopulmonary disease. 3. Aortic atherosclerosis. Electronically Signed   By: Vinnie Langton M.D.   On: 02/20/2020 08:04         Scheduled Meds: . acetaminophen  500 mg Oral Q12H  . vitamin C  500 mg Oral Daily  . aspirin  81 mg Oral Daily  . cholecalciferol  2,000 Units Oral BID  . docusate sodium  100 mg Oral BID  . enoxaparin (LOVENOX) injection  40 mg Subcutaneous Q24H  . gabapentin  300 mg Oral QHS  . insulin aspart  0-9 Units Subcutaneous TID WC  . lisinopril  20 mg Oral Daily  . magnesium oxide  400 mg Oral BID  . multivitamin with minerals  1 tablet Oral Daily  . pantoprazole  20 mg Oral Daily  . polyethylene glycol  17 g Oral Daily  . rosuvastatin  10 mg Oral QHS   Continuous Infusions: . lactated ringers Stopped (02/17/20 0100)  . methocarbamol (ROBAXIN) IV       LOS: 5 days    Time spent: 32 minutes spent on chart review, discussion with nursing staff, consultants, updating family and interview/physical exam; more than 50% of that time was spent in counseling and/or coordination of care.    Lyndy Russman J [place] DO [facility] Available via Epic secure chat 7am-7pm After these hours, please refer to coverage provider listed on [email or web address] 02/21/2020, 1:37 PM

## 2020-02-21 NOTE — Social Work (Signed)
CSW acknowledging consult for SNF/HH/DME. Will follow for therapy recommendations needed to best determine disposition/for insurance authorization. Current recommendations for CIR, will complete fl2 as back up.   Octavio Graves, MSW, LCSW [facility] Clinical Social Work

## 2020-02-21 NOTE — NC FL2 (Signed)
MEDICAID FL2 LEVEL OF CARE SCREENING TOOL     IDENTIFICATION  Patient Name: Cameron Velasquez Birthdate: May 03, 1956 Sex: male Admission Date (Current Location): 02/16/2020  [facility] and [place] Number:  [employer] and Address:  [facility], [address] [place], [place] [address]      Provider Number: [id number]  Attending Physician Name and Address:  Myrene Galas, MD  Relative Name and Phone Number:       Current Level of Care: Hospital Recommended Level of Care: Skilled Nursing Facility Prior Approval Number:    Date Approved/Denied:   PASRR Number: [id number]  Discharge Plan: SNF    Current Diagnoses: Patient Active Problem List   Diagnosis Date Noted  . Left tibial fracture 02/16/2020  . Leukocytosis 02/16/2020  . Essential hypertension 02/16/2020  . Diabetes mellitus type 2, controlled (HCC) 02/16/2020  . Fall from ladder 02/16/2020    Orientation RESPIRATION BLADDER Height & Weight     Self, Time, Situation, Place  Normal Continent Weight: 265 lb (120.2 kg) Height:  5\' 11"  (180.3 cm)  BEHAVIORAL SYMPTOMS/MOOD NEUROLOGICAL BOWEL NUTRITION STATUS      Continent Diet(see discharge summary)  AMBULATORY STATUS COMMUNICATION OF NEEDS Skin   Extensive Assist Verbally Surgical wounds, Skin abrasions, Other (Comment)(laceration on R tibia w/ compression wrap; closed incision on LLE; abrasion on right leg/knee)                       Personal Care Assistance Level of Assistance  Feeding, Bathing, Dressing Bathing Assistance: Maximum assistance Feeding assistance: Independent Dressing Assistance: Maximum assistance     Functional Limitations Info  Sight, Hearing, Speech Sight Info: Adequate Hearing Info: Adequate Speech Info: Adequate    SPECIAL CARE FACTORS FREQUENCY  PT (By licensed PT), OT (By licensed OT)     PT Frequency: 5x week OT Frequency: 5x week            Contractures Contractures  Info: Not present    Additional Factors Info  Code Status, Allergies, Insulin Sliding Scale Code Status Info: Full Code Allergies Info: No Known Allergies   Insulin Sliding Scale Info: insulin aspart (novoLOG) injection 0-9 Units 3x daily with meals       Current Medications (02/21/2020):  This is the current hospital active medication list Current Facility-Administered Medications  Medication Dose Route Frequency Provider Last Rate Last Admin  . acetaminophen (TYLENOL) tablet 325-650 mg  325-650 mg Oral Q6H PRN 02/23/2020, PA-C      . acetaminophen (TYLENOL) tablet 500 mg  500 mg Oral Q12H Montez Morita, PA-C   500 mg at 02/18/20 2240  . ascorbic acid (VITAMIN C) tablet 500 mg  500 mg Oral Daily 02/20/20, PA-C   500 mg at 02/21/20 1007  . aspirin chewable tablet 81 mg  81 mg Oral Daily 02/23/20, PA-C   81 mg at 02/21/20 1007  . cholecalciferol (VITAMIN D3) tablet 2,000 Units  2,000 Units Oral BID 02/23/20, PA-C   2,000 Units at 02/21/20 1006  . docusate sodium (COLACE) capsule 100 mg  100 mg Oral BID 02/23/20, PA-C   100 mg at 02/21/20 1007  . enoxaparin (LOVENOX) injection 40 mg  40 mg Subcutaneous Q24H 02/23/20, PA-C   40 mg at 02/21/20 02/23/20  . gabapentin (NEURONTIN) capsule 300 mg  300 mg Oral QHS 2725, PA-C   300 mg at 02/20/20 2159  . hydrALAZINE (APRESOLINE) tablet 25 mg  25 mg Oral Q8H PRN [place] Eric J, DO   25 mg at 02/18/20 2100  . HYDROcodone-acetaminophen (NORCO) 7.5-325 MG per tablet 1-2 tablet  1-2 tablet Oral Q4H PRN Ainsley Spinner, PA-C   2 tablet at 02/21/20 4098  . HYDROcodone-acetaminophen (NORCO/VICODIN) 5-325 MG per tablet 1-2 tablet  1-2 tablet Oral Q4H PRN Ainsley Spinner, PA-C   2 tablet at 02/18/20 1709  . insulin aspart (novoLOG) injection 0-9 Units  0-9 Units Subcutaneous TID WC Ainsley Spinner, PA-C   2 Units at 02/21/20 1191  . lactated ringers infusion   Intravenous Continuous Ainsley Spinner, PA-C   Stopped at 02/17/20 0100  . lisinopril (ZESTRIL)  tablet 20 mg  20 mg Oral Daily [place] Donnamarie Poag, DO   20 mg at 02/21/20 1007  . magnesium oxide (MAG-OX) tablet 400 mg  400 mg Oral BID Ainsley Spinner, PA-C   400 mg at 02/21/20 1007  . methocarbamol (ROBAXIN) tablet 500-1,000 mg  500-1,000 mg Oral Q6H PRN Ainsley Spinner, PA-C   1,000 mg at 02/19/20 1530   Or  . methocarbamol (ROBAXIN) 500 mg in dextrose 5 % 50 mL IVPB  500 mg Intravenous Q6H PRN Ainsley Spinner, PA-C      . metoCLOPramide (REGLAN) tablet 5-10 mg  5-10 mg Oral Q8H PRN Ainsley Spinner, PA-C       Or  . metoCLOPramide (REGLAN) injection 5-10 mg  5-10 mg Intravenous Q8H PRN Ainsley Spinner, PA-C      . morphine 2 MG/ML injection 0.5-1 mg  0.5-1 mg Intravenous Q2H PRN Ainsley Spinner, PA-C   1 mg at 02/19/20 1958  . multivitamin with minerals tablet 1 tablet  1 tablet Oral Daily Ainsley Spinner, PA-C   1 tablet at 02/21/20 1007  . ondansetron (ZOFRAN) tablet 4 mg  4 mg Oral Q6H PRN Ainsley Spinner, PA-C       Or  . ondansetron [facility]) injection 4 mg  4 mg Intravenous Q6H PRN Ainsley Spinner, PA-C   4 mg at 02/18/20 2253  . pantoprazole (PROTONIX) EC tablet 20 mg  20 mg Oral Daily Ainsley Spinner, PA-C   20 mg at 02/21/20 1006  . polyethylene glycol (MIRALAX / GLYCOLAX) packet 17 g  17 g Oral Daily Ainsley Spinner, PA-C   17 g at 02/21/20 1007  . rosuvastatin (CRESTOR) tablet 10 mg  10 mg Oral QHS Ainsley Spinner, PA-C   10 mg at 02/20/20 2159  . traMADol (ULTRAM) tablet 50 mg  50 mg Oral Q8H PRN Ainsley Spinner, PA-C   50 mg at 02/20/20 1154     Discharge Medications: Please see discharge summary for a list of discharge medications.  Relevant Imaging Results:  Relevant Lab Results:   Additional Information SS#[id number] [place], [place]

## 2020-02-21 NOTE — Progress Notes (Signed)
Subjective: 5 Days Post-Op Procedure(s) (LRB): IRRIGATION AND DEBRIDEMENT OPEN FRACTURE EXTREMITY (Bilateral) EXTERNAL FIXATION LEG (Left) Arthrocentesis Injection (Left) Closed Reduction Tibia (Left) Patient reports pain as mild.    Objective: Vital signs in last 24 hours: Temp:  [98.4 F (36.9 C)-98.5 F (36.9 C)] 98.4 F (36.9 C) (04/17 0317) Pulse Rate:  [86-94] 86 (04/17 0317) Resp:  [17] 17 (04/16 1358) BP: (155-164)/(70-88) 164/83 (04/17 0317) SpO2:  [95 %-99 %] 95 % (04/17 0317)  Intake/Output from previous day: 04/16 0701 - 04/17 0700 In: 240 [P.O.:240] Out: -  Intake/Output this shift: No intake/output data recorded.  Recent Labs    02/19/20 0453  HGB 9.5*   Recent Labs    02/19/20 0453  WBC 11.4*  RBC 3.16*  HCT 28.6*  PLT 174   Recent Labs    02/19/20 0453 02/19/20 0453 02/19/20 1933 02/20/20 0813  NA 136  --   --  137  K 3.9   < > 3.8 3.7  CL 101  --   --  101  CO2 26  --   --  26  BUN 15  --   --  19  CREATININE 0.84  --   --  0.91  GLUCOSE 207*  --   --  201*  CALCIUM 8.5*  --   --  8.7*   < > = values in this interval not displayed.   No results for input(s): LABPT, INR in the last 72 hours.  Neurovascular intact Sensation intact distally Intact pulses distally Dorsiflexion/Plantar flexion intact Incision: dressing C/D/I   Assessment/Plan: 5 Days Post-Op Procedure(s) (LRB): IRRIGATION AND DEBRIDEMENT OPEN FRACTURE EXTREMITY (Bilateral) EXTERNAL FIXATION LEG (Left) Arthrocentesis Injection (Left) Closed Reduction Tibia (Left)  POD/HD#: 25  64 y/o male s/p fall off ladder >15 ft  -fall off ladder  - L bicondylar tibial plateau fracture s/p Ex fix  NWB L leg  Aggressive Ice and elevation  Heel cord stretching foot plate made Therapy  Plan for OR Monday for definitive fixationat 1015 pin careas needed    Float heel off bed   - traumatic wound L leg s/p I&D and closure    - traumatic wound R leg s/p I&D and closure prevena in place  Will remove in OR Monday  WBAT R Leg No ROM restrictions R hip, knee or ankle   - R PTXs/p chest tube has been d/c   - Pain management: Continue with current regimen  - ABL anemia/Hemodynamics Stable Monitor  - Medical issues Per medical team   AKI appears improved Renal function looks to be at baseline Metformin and ACEI still on hold   HTN ACE inhibitor restarted   DM Blood sugars have been looking good    - DVT/PE prophylaxis: lovenox - ID: [id number]  - Metabolic Bone Disease: Vitamin d levels look good  - Activity: WBAT R leg NWB L leg  - FEN/GI prophylaxis/Foley/Lines: Carb mod diet IVF  -Ex-fix/Splint care: Ok to manipulate L leg by ex fix  - Impediments to fracture healing: Severe soft tissue injury  High energy injury  DM  - Dispo: Continue with inpatient care  OR Monday for definitive fixation              CIR eval    Margart Sickles 02/21/2020, 9:51 AM

## 2020-02-21 NOTE — Plan of Care (Signed)
  Problem: Clinical Measurements: Goal: Respiratory complications will improve Outcome: Progressing Goal: Cardiovascular complication will be avoided Outcome: Progressing   

## 2020-02-21 NOTE — Plan of Care (Signed)
  Problem: Safety: Goal: Ability to remain free from injury will improve Outcome: Progressing   Problem: Skin Integrity: Goal: Risk for impaired skin integrity will decrease Outcome: Progressing   

## 2020-02-22 LAB — GLUCOSE, CAPILLARY
Glucose-Capillary: 256 mg/dL — ABNORMAL HIGH (ref 70–99)
Glucose-Capillary: 232 mg/dL — ABNORMAL HIGH (ref 70–99)
Glucose-Capillary: 180 mg/dL — ABNORMAL HIGH (ref 70–99)
Glucose-Capillary: 188 mg/dL — ABNORMAL HIGH (ref 70–99)

## 2020-02-22 NOTE — Progress Notes (Signed)
PROGRESS NOTE    Cameron Velasquez  ACZ:[id number] DOB: 1956-07-06 DOA: 02/16/2020 PCP: Laurel Dimmer, FNP    Brief Narrative:  Cameron Velasquez is a 64 year old Caucasian male with past medical history remarkable for type 2 diabetes mellitus, essential hypertension, hyperlipidemia, GERD who was admitted to the trauma service following fall off of a ladder, roughly 15 feet high without loss of consciousness suffering multiple fractures and right-sided pneumothorax.  Trauma service requested Hospitalist assistance with medical comanagement.   Assessment & Plan:   Principal Problem:   Left tibial fracture Active Problems:   Leukocytosis   Essential hypertension   Diabetes mellitus type 2, controlled (HCC)   Fall from ladder   Acute renal failure: Resolved Creatinine peaked at 1.40 on 02/17/2020.  Suspect dehydration/prerenal azotemia.  Resolved with IV fluid hydration.  ACE inhibitor now restarted. --Cr 1.07-->0.90-->1.40-->1.02-->0.84-->0.91 --Repeat BMP tomorrow   Type 2 diabetes mellitus Hemoglobin A1c 5.6, well controlled.  At home on Trulicity 1.5 mg [place] q Sunday, glimepiride 4 mg p.o. daily, Metformin 1000 mg twice daily. --Holding oral hypoglycemics and Trulicity while inpatient --Required 7 units for total insulin coverage past 24 hours --Insulin sliding scale for coverage while inpatient; if requires greater than 10 units of coverage insulin, will consider long-acting insulin at that time --CBGs 4 times daily qAC/HS  Essential hypertension --restarted lisinopril 20mg  PO daily 4/15 --Hydralazine 25 mg p.o. every 8 hours prn for SBP >170 or DBP >110 --Continue aspirin and statin  HLD: Continue Crestor 10 mg p.o. daily  Right seventh rib fracture with tension pneumothorax Underwent right pigtail thoracostomy by Dr. 5/15 on 02/17/2020.  Repeat chest x-ray this morning with right-sided chest tube in place without pneumothorax.  Chest tube placed to waterseal  on 02/19/2020, repeat x-ray 4/16 with chest tube in place with no pneumothorax.  Chest tube was removed 4/16 with repeat chest x-ray without any residual pneumothorax.  Trauma surgery now signed off with outpatient follow-up.  Patient breathing well without hypoxia, and no shortness of breath. --continue Incentive spirometry  Left bicondylar tibial plateau fracture Underwent external fixation, nonweightbearing left lower extremity.  Orthopedics plans for definitive fixation Monday.  Traumatic wounds right/left legs Underwent I&D with closure by orthopedics.  Further management per Ortho. --Pain control with Tylenol, tramadol, morphine, Robaxin   DVT prophylaxis: Lovenox Code Status: Full code Family Communication: Updated patient extensively at bedside Disposition Plan: Per primary, orthopedics; likely CIR versus SNF       Procedures:   Right pigtail tube thoracostomy - Dr. Saturday 02/17/2020, dc'd 02/20/2020  Antimicrobials:   Perioperative cefazolin 4/12 - 4/13  Irrigation debridement open fracture with external fixation 4/12 - Dr. 6/12   Subjective: Patient seen and examined at bedside, resting comfortably. Reports some leg pain. No shortness of breath.  No other complaints.  Readily anticipating surgery tomorrow.  Denies headache, no fever/chills/night sweats, no nausea/vomiting/diarrhea, no chest pain, no shortness of breath, no abdominal pain, no fatigue, no paresthesias.  No acute events overnight per nursing staff.  Objective: Vitals:   02/21/20 1736 02/21/20 2015 02/22/20 0520 02/22/20 0932  BP: 139/84 (!) 142/87 (!) 167/99 (!) 159/80  Pulse: (!) 56 88 89 93  Resp: 18 18 18 18   Temp: (!) 97.5 F (36.4 C) 98.7 F (37.1 C) 98.4 F (36.9 C) 98.2 F (36.8 C)  TempSrc: Oral Oral Oral Oral  SpO2: 100% 99% 98% 96%  Weight:      Height:        Intake/Output Summary (Last 24 hours)  at 02/22/2020 1247 Last data filed at 02/22/2020 1000 Gross per 24 hour  Intake 0 ml    Output 400 ml  Net -400 ml   Filed Weights   02/16/20 1029  Weight: 120.2 kg    Examination:  General exam: Appears calm and comfortable  Respiratory system: Clear to auscultation. Respiratory effort normal. Oxygenating well on room air.  Cardiovascular system: S1 & S2 heard, RRR. No JVD, murmurs, rubs, gallops or clicks. No pedal edema. Gastrointestinal system: Abdomen is nondistended, soft and nontender. No organomegaly or masses felt. Normal bowel sounds heard. Central nervous system: Alert and oriented. No focal neurological deficits. Extremities: Symmetric 5 x 5 power.  Noted external fixation left lower extremity and wound VAC right lower extremity Skin: No rashes, lesions or ulcers Psychiatry: Judgement and insight appear normal. Mood & affect appropriate.     Data Reviewed: I have personally reviewed following labs and imaging studies  CBC: Recent Labs  Lab 02/16/20 1024 02/16/20 1038 02/17/20 0419 02/18/20 0249 02/19/20 0453  WBC 10.6*  --  15.1* 11.4* 11.4*  HGB 14.8 14.3 11.7* 9.8* 9.5*  HCT 44.2 42.0 35.2* 29.0* 28.6*  MCV 90.4  --  91.2 90.9 90.5  PLT 177  --  197 153 174   Basic Metabolic Panel: Recent Labs  Lab 02/16/20 1024 02/16/20 1024 02/16/20 1038 02/16/20 1038 02/17/20 0419 02/18/20 0249 02/19/20 0453 02/19/20 1933 02/20/20 0813  NA 137   < > 137  --  139 138 136  --  137  K 5.1   < > 4.8   < > 5.0 4.1 3.9 3.8 3.7  CL 104   < > 105  --  105 105 101  --  101  CO2 18*  --   --   --  22 25 26   --  26  GLUCOSE 155*   < > 152*  --  207* 185* 207*  --  201*  BUN 18   < > 22  --  24* 17 15  --  19  CREATININE 1.07   < > 0.90  --  1.40* 1.02 0.84  --  0.91  CALCIUM 9.3  --   --   --  8.2* 8.2* 8.5*  --  8.7*  MG  --   --   --   --   --   --   --  1.6*  --   PHOS  --   --   --   --   --  3.7  --   --   --    < > = values in this interval not displayed.   GFR: Estimated Creatinine Clearance: 106.8 mL/min (by C-G formula based on SCr of 0.91  mg/dL). Liver Function Tests: Recent Labs  Lab 02/16/20 1024 02/17/20 0419 02/18/20 0249  AST 37 28  --   ALT 21 18  --   ALKPHOS 56 43  --   BILITOT 1.2 0.8  --   PROT 6.7 6.0*  --   ALBUMIN 4.2 3.4* 3.2*   No results for input(s): LIPASE, AMYLASE in the last 168 hours. No results for input(s): AMMONIA in the last 168 hours. Coagulation Profile: Recent Labs  Lab 02/16/20 1119  INR 1.1   Cardiac Enzymes: Recent Labs  Lab 02/17/20 0947 02/18/20 0249  CKTOTAL 414* 504*   BNP (last 3 results) No results for input(s): PROBNP in the last 8760 hours. HbA1C: No results for input(s): HGBA1C in the last 72  hours. CBG: Recent Labs  Lab 02/21/20 0655 02/21/20 1204 02/21/20 1654 02/21/20 2027 02/22/20 0929  GLUCAP 173* 201* 169* 173* 256*   Lipid Profile: No results for input(s): CHOL, HDL, LDLCALC, TRIG, CHOLHDL, LDLDIRECT in the last 72 hours. Thyroid Function Tests: No results for input(s): TSH, T4TOTAL, FREET4, T3FREE, THYROIDAB in the last 72 hours. Anemia Panel: No results for input(s): VITAMINB12, FOLATE, FERRITIN, TIBC, IRON, RETICCTPCT in the last 72 hours. Sepsis Labs: Recent Labs  Lab 02/16/20 1024 02/16/20 1234 02/16/20 1540 02/18/20 0249  LATICACIDVEN 3.5* 2.3* 2.8* 1.0    Recent Results (from the past 240 hour(s))  Respiratory Panel by RT PCR (Flu A&B, Covid) - Nasopharyngeal Swab     Status: None   Collection Time: 02/16/20 10:48 AM   Specimen: Nasopharyngeal Swab  Result Value Ref Range Status   SARS Coronavirus 2 by RT PCR NEGATIVE NEGATIVE Final    Comment: (NOTE) SARS-CoV-2 target nucleic acids are NOT DETECTED. The SARS-CoV-2 RNA is generally detectable in upper respiratoy specimens during the acute phase of infection. The lowest concentration of SARS-CoV-2 viral copies this assay can detect is 131 copies/mL. A negative result does not preclude SARS-Cov-2 infection and should not be used as the sole basis for treatment or other patient  management decisions. A negative result may occur with  improper specimen collection/handling, submission of specimen other than nasopharyngeal swab, presence of viral mutation(s) within the areas targeted by this assay, and inadequate number of viral copies (<131 copies/mL). A negative result must be combined with clinical observations, patient history, and epidemiological information. The expected result is Negative. Fact Sheet for Patients:  PinkCheek.be Fact Sheet for Healthcare Providers:  GravelBags.it This test is not yet ap proved or cleared by the [place] FDA and  has been authorized for detection and/or diagnosis of SARS-CoV-2 by FDA under an Emergency Use Authorization (EUA). This EUA will remain  in effect (meaning this test can be used) for the duration of the COVID-19 declaration under Section 564(b)(1) of the Act, 21 U.S.C. section 360bbb-3(b)(1), unless the authorization is terminated or revoked sooner.    Influenza A by PCR NEGATIVE NEGATIVE Final   Influenza B by PCR NEGATIVE NEGATIVE Final    Comment: (NOTE) The Xpert Xpress SARS-CoV-2/FLU/RSV assay is intended as an aid in  the diagnosis of influenza from Nasopharyngeal swab specimens and  should not be used as a sole basis for treatment. Nasal washings and  aspirates are unacceptable for Xpert Xpress SARS-CoV-2/FLU/RSV  testing. Fact Sheet for Patients: PinkCheek.be Fact Sheet for Healthcare Providers: GravelBags.it This test is not yet approved or cleared by the [place] FDA and  has been authorized for detection and/or diagnosis of SARS-CoV-2 by  FDA under an Emergency Use Authorization (EUA). This EUA will remain  in effect (meaning this test can be used) for the duration of the  Covid-19 declaration under Section 564(b)(1) of the Act, 21  U.S.C. section 360bbb-3(b)(1), unless the  authorization is  terminated or revoked. Performed at [facility], [address], [place], [place] [address]   Surgical pcr screen     Status: None   Collection Time: 02/16/20  3:30 PM   Specimen: Nasal Mucosa; Nasal Swab  Result Value Ref Range Status   MRSA, PCR NEGATIVE NEGATIVE Final   Staphylococcus aureus NEGATIVE NEGATIVE Final    Comment: (NOTE) The Xpert SA Assay (FDA approved for NASAL specimens in patients 96 years of age and older), is one component of a comprehensive  surveillance program. It is not intended to diagnose infection nor to guide or monitor treatment. Performed at [facility], [address] [place], [place] [address]          Radiology Studies: No results found.      Scheduled Meds: . acetaminophen  500 mg Oral Q12H  . vitamin C  500 mg Oral Daily  . aspirin  81 mg Oral Daily  . cholecalciferol  2,000 Units Oral BID  . docusate sodium  100 mg Oral BID  . enoxaparin (LOVENOX) injection  40 mg Subcutaneous Q24H  . gabapentin  300 mg Oral QHS  . insulin aspart  0-9 Units Subcutaneous TID WC  . lisinopril  20 mg Oral Daily  . multivitamin with minerals  1 tablet Oral Daily  . pantoprazole  20 mg Oral Daily  . polyethylene glycol  17 g Oral Daily  . rosuvastatin  10 mg Oral QHS   Continuous Infusions: . lactated ringers Stopped (02/17/20 0100)  . methocarbamol (ROBAXIN) IV       LOS: 6 days    Time spent: 32 minutes spent on chart review, discussion with nursing staff, consultants, updating family and interview/physical exam; more than 50% of that time was spent in counseling and/or coordination of care.    Alvira Philips [place], DO [facility] Available via Epic secure chat 7am-7pm After these hours, please refer to coverage provider listed on [email or web address] 02/22/2020, 12:47 PM

## 2020-02-22 NOTE — Plan of Care (Signed)

## 2020-02-22 NOTE — Progress Notes (Signed)
Subjective: 6 Days Post-Op Procedure(s) (LRB): IRRIGATION AND DEBRIDEMENT OPEN FRACTURE EXTREMITY (Bilateral) EXTERNAL FIXATION LEG (Left) Arthrocentesis Injection (Left) Closed Reduction Tibia (Left) Patient reports pain as mild.    Objective: Vital signs in last 24 hours: Temp:  [97.5 F (36.4 C)-98.7 F (37.1 C)] 98.2 F (36.8 C) (04/18 0932) Pulse Rate:  [56-93] 93 (04/18 0932) Resp:  [18-20] 18 (04/18 0932) BP: (139-167)/(80-99) 159/80 (04/18 0932) SpO2:  [96 %-100 %] 96 % (04/18 0932)  Intake/Output from previous day: 04/17 0701 - 04/18 0700 In: -  Out: 400 [Urine:400] Intake/Output this shift: No intake/output data recorded.  No results for input(s): HGB in the last 72 hours. No results for input(s): WBC, RBC, HCT, PLT in the last 72 hours. Recent Labs    02/19/20 1933 02/20/20 0813  NA  --  137  K 3.8 3.7  CL  --  101  CO2  --  26  BUN  --  19  CREATININE  --  0.91  GLUCOSE  --  201*  CALCIUM  --  8.7*   No results for input(s): LABPT, INR in the last 72 hours.  Neurovascular intact Sensation intact distally Dorsiflexion/Plantar flexion intact Incision: dressing C/D/I   Assessment/Plan: 6 Days Post-Op Procedure(s) (LRB): IRRIGATION AND DEBRIDEMENT OPEN FRACTURE EXTREMITY (Bilateral) EXTERNAL FIXATION LEG (Left) Arthrocentesis Injection (Left) Closed Reduction Tibia (Left)  POD/HD#:6  64 y/o male s/p fall off ladder >15 ft  -fall off ladder  - L bicondylar tibial plateau fracture s/p Ex fix  NWB L leg  Aggressive Ice and elevation  Heel cord stretching foot plate made Therapy  Plan for OR Monday for definitive fixationat 1015 pin careas needed  Float heel off bed   - traumatic wound L leg s/p I&D and closure    - traumatic wound R leg s/p I&D and closure prevena in place   Will removein OR Monday WBAT R Leg No ROM restrictions R hip, knee or ankle   - R PTXs/p chest tube has been d/c   - Pain management: Continue with current regimen  - ABL anemia/Hemodynamics Stable Monitor  - Medical issues Per medical team   AKI appears improved Renal function looks to be at baseline Metformin and ACEI still on hold   HTN ACE inhibitor restarted  DM Blood sugars have been looking good    - DVT/PE prophylaxis: lovenox - ID: [id number]  - Metabolic Bone Disease: Vitamin d levels look good  - Activity: WBAT R leg NWB L leg  - FEN/GI prophylaxis/Foley/Lines: Carb mod diet IVF  -Ex-fix/Splint care: Ok to manipulate L leg by ex fix  - Impediments to fracture healing: Severe soft tissue injury  High energy injury  DM  - Dispo: Continue with inpatient care  ORMonday for definitive fixation  CIR eval    Margart Sickles 02/22/2020, 9:47 AM

## 2020-02-22 NOTE — H&P (Signed)
Ortho H&P  Please refer to the Consultation Note by Charma Igo, PA-C, which constitutes the H&P with the following changes:  We will admit this Trauma Activation patient to our service with both Trauma and Medicine consulting given underlying health problems and the polytrauma with pneumothorax. Furthermore, because of the magnitude of his bilateral open leg wounds we need to proceed to the OR tonight to reduce infection risk and restore length to the left tibial plateau.  Myrene Galas, MD [facility], [facility] [phone]

## 2020-02-23 ENCOUNTER — Inpatient Hospital Stay (HOSPITAL_COMMUNITY): Payer: Medicare Other | Admitting: Anesthesiology

## 2020-02-23 ENCOUNTER — Encounter (HOSPITAL_COMMUNITY): Payer: Self-pay | Admitting: Orthopedic Surgery

## 2020-02-23 ENCOUNTER — Encounter (HOSPITAL_COMMUNITY)
Admission: EM | Disposition: A | Discharge status: Rehabilitation Facility | Payer: Self-pay | Source: Home / Self Care | Attending: Orthopedic Surgery

## 2020-02-23 ENCOUNTER — Inpatient Hospital Stay (HOSPITAL_COMMUNITY): Payer: Medicare Other

## 2020-02-23 DIAGNOSIS — N179 Acute kidney failure, unspecified: Secondary | ICD-10-CM

## 2020-02-23 HISTORY — PX: ORIF TIBIA PLATEAU: SHX2132

## 2020-02-23 HISTORY — PX: EXTERNAL FIXATION REMOVAL: SHX5040

## 2020-02-23 LAB — GLUCOSE, CAPILLARY
Glucose-Capillary: 200 mg/dL — ABNORMAL HIGH (ref 70–99)
Glucose-Capillary: 157 mg/dL — ABNORMAL HIGH (ref 70–99)
Glucose-Capillary: 218 mg/dL — ABNORMAL HIGH (ref 70–99)
Glucose-Capillary: 242 mg/dL — ABNORMAL HIGH (ref 70–99)
Glucose-Capillary: 333 mg/dL — ABNORMAL HIGH (ref 70–99)

## 2020-02-23 LAB — POCT I-STAT, CHEM 8
BUN: 21 mg/dL (ref 8–23)
Calcium, Ion: 1.2 mmol/L (ref 1.15–1.40)
Chloride: 104 mmol/L (ref 98–111)
Creatinine, Ser: 0.9 mg/dL (ref 0.61–1.24)
Glucose, Bld: 188 mg/dL — ABNORMAL HIGH (ref 70–99)
HCT: 27 % — ABNORMAL LOW (ref 39.0–52.0)
Hemoglobin: 9.2 g/dL — ABNORMAL LOW (ref 13.0–17.0)
Potassium: 4 mmol/L (ref 3.5–5.1)
Sodium: 141 mmol/L (ref 135–145)
TCO2: 29 mmol/L (ref 22–32)

## 2020-02-23 LAB — TYPE AND SCREEN
ABO/RH(D): A POS
Antibody Screen: NEGATIVE

## 2020-02-23 LAB — BASIC METABOLIC PANEL
Anion gap: 12 (ref 5–15)
BUN: 18 mg/dL (ref 8–23)
CO2: 24 mmol/L (ref 22–32)
Calcium: 8.9 mg/dL (ref 8.9–10.3)
Chloride: 102 mmol/L (ref 98–111)
Creatinine, Ser: 0.91 mg/dL (ref 0.61–1.24)
GFR calc Af Amer: >60 mL/min (ref >60)
GFR calc non Af Amer: >60 mL/min (ref >60)
Glucose, Bld: 201 mg/dL — ABNORMAL HIGH (ref 70–99)
Potassium: 3.4 mmol/L — ABNORMAL LOW (ref 3.5–5.1)
Sodium: 138 mmol/L (ref 135–145)

## 2020-02-23 LAB — RAPID HIV SCREEN (HIV 1/2 AB+AG)
HIV 1/2 Antibodies: NONREACTIVE
HIV-1 P24 Antigen - HIV24: NONREACTIVE

## 2020-02-23 LAB — CBC
HCT: 29.7 % — ABNORMAL LOW (ref 39.0–52.0)
Hemoglobin: 9.7 g/dL — ABNORMAL LOW (ref 13.0–17.0)
MCH: 30.9 pg (ref 26.0–34.0)
MCHC: 32.7 g/dL (ref 30.0–36.0)
MCV: 94.6 fL (ref 80.0–100.0)
Platelets: 292 10*3/uL (ref 150–400)
RBC: 3.14 MIL/uL — ABNORMAL LOW (ref 4.22–5.81)
RDW: 14.4 % (ref 11.5–15.5)
WBC: 10.7 10*3/uL — ABNORMAL HIGH (ref 4.0–10.5)
nRBC: 0 % (ref 0.0–0.2)

## 2020-02-23 LAB — HEPATITIS B SURFACE ANTIGEN: Hepatitis B Surface Ag: NONREACTIVE

## 2020-02-23 LAB — ABO/RH: ABO/RH(D): A POS

## 2020-02-23 SURGERY — OPEN REDUCTION INTERNAL FIXATION (ORIF) TIBIAL PLATEAU
Anesthesia: General | Site: Leg Lower | Laterality: Left

## 2020-02-23 MED ORDER — ACETAMINOPHEN 500 MG PO TABS: 1000.0000 mg | ORAL_TABLET | Freq: Once | ORAL | Status: AC

## 2020-02-23 MED ORDER — MIDAZOLAM HCL 2 MG/2ML IJ SOLN
INTRAMUSCULAR | Status: AC
  Filled 2020-02-23: qty 2

## 2020-02-23 MED ORDER — PROMETHAZINE HCL 25 MG/ML IJ SOLN: 6.2500 mg | INTRAMUSCULAR | Status: DC | PRN

## 2020-02-23 MED ORDER — VANCOMYCIN HCL 1000 MG IV SOLR
INTRAVENOUS | Status: DC | PRN
  Administered 2020-02-23: 1000 mg

## 2020-02-23 MED ORDER — ROCURONIUM BROMIDE 10 MG/ML (PF) SYRINGE
PREFILLED_SYRINGE | INTRAVENOUS | Status: DC | PRN
  Administered 2020-02-23: 20 mg via INTRAVENOUS
  Administered 2020-02-23: 60 mg via INTRAVENOUS
  Administered 2020-02-23: 10 mg via INTRAVENOUS

## 2020-02-23 MED ORDER — CEFAZOLIN SODIUM-DEXTROSE 2-4 GM/100ML-% IV SOLN: 2.0000 g | Freq: Once | INTRAVENOUS | Status: DC

## 2020-02-23 MED ORDER — TRAMADOL HCL 50 MG PO TABS
50.0000 mg | ORAL_TABLET | Freq: Three times a day (TID) | ORAL | Status: DC | PRN
  Administered 2020-02-24: 50 mg via ORAL
  Filled 2020-02-23: qty 1

## 2020-02-23 MED ORDER — MIDAZOLAM HCL 5 MG/5ML IJ SOLN
INTRAMUSCULAR | Status: DC | PRN
  Administered 2020-02-23: 2 mg via INTRAVENOUS

## 2020-02-23 MED ORDER — PHENYLEPHRINE 40 MCG/ML (10ML) SYRINGE FOR IV PUSH (FOR BLOOD PRESSURE SUPPORT)
PREFILLED_SYRINGE | INTRAVENOUS | Status: DC | PRN
  Administered 2020-02-23 (×2): 120 ug via INTRAVENOUS
  Administered 2020-02-23: 80 ug via INTRAVENOUS
  Administered 2020-02-23: 120 ug via INTRAVENOUS

## 2020-02-23 MED ORDER — VANCOMYCIN HCL 1000 MG IV SOLR
INTRAVENOUS | Status: AC
  Filled 2020-02-23: qty 1000

## 2020-02-23 MED ORDER — PHENYLEPHRINE HCL-NACL 10-0.9 MG/250ML-% IV SOLN
INTRAVENOUS | Status: DC | PRN
  Administered 2020-02-23: 30 ug/min via INTRAVENOUS

## 2020-02-23 MED ORDER — METOCLOPRAMIDE HCL 5 MG/ML IJ SOLN: 5.0000 mg | Freq: Three times a day (TID) | INTRAMUSCULAR | Status: DC | PRN

## 2020-02-23 MED ORDER — DOCUSATE SODIUM 100 MG PO CAPS
100.0000 mg | ORAL_CAPSULE | Freq: Two times a day (BID) | ORAL | Status: DC
  Administered 2020-02-24 – 2020-02-26 (×2): 100 mg via ORAL
  Filled 2020-02-23 (×5): qty 1

## 2020-02-23 MED ORDER — FENTANYL CITRATE (PF) 250 MCG/5ML IJ SOLN
INTRAMUSCULAR | Status: AC
  Filled 2020-02-23: qty 5

## 2020-02-23 MED ORDER — METOCLOPRAMIDE HCL 5 MG PO TABS: 5.0000 mg | ORAL_TABLET | Freq: Three times a day (TID) | ORAL | Status: DC | PRN

## 2020-02-23 MED ORDER — DEXAMETHASONE SODIUM PHOSPHATE 10 MG/ML IJ SOLN
INTRAMUSCULAR | Status: AC
  Filled 2020-02-23: qty 3

## 2020-02-23 MED ORDER — FENTANYL CITRATE (PF) 100 MCG/2ML IJ SOLN
INTRAMUSCULAR | Status: AC
  Filled 2020-02-23: qty 2

## 2020-02-23 MED ORDER — DEXAMETHASONE SODIUM PHOSPHATE 10 MG/ML IJ SOLN
INTRAMUSCULAR | Status: DC | PRN
  Administered 2020-02-23: 5 mg via INTRAVENOUS

## 2020-02-23 MED ORDER — ROCURONIUM BROMIDE 10 MG/ML (PF) SYRINGE
PREFILLED_SYRINGE | INTRAVENOUS | Status: AC
  Filled 2020-02-23: qty 10

## 2020-02-23 MED ORDER — FENTANYL CITRATE (PF) 100 MCG/2ML IJ SOLN
INTRAMUSCULAR | Status: DC | PRN
  Administered 2020-02-23 (×6): 50 ug via INTRAVENOUS
  Administered 2020-02-23: 100 ug via INTRAVENOUS

## 2020-02-23 MED ORDER — PHENYLEPHRINE 40 MCG/ML (10ML) SYRINGE FOR IV PUSH (FOR BLOOD PRESSURE SUPPORT)
PREFILLED_SYRINGE | INTRAVENOUS | Status: AC
  Filled 2020-02-23: qty 20

## 2020-02-23 MED ORDER — DEXTROSE 5 % IV SOLN
3.0000 g | Freq: Once | INTRAVENOUS | Status: DC
  Filled 2020-02-23: qty 3000

## 2020-02-23 MED ORDER — ONDANSETRON HCL 4 MG/2ML IJ SOLN
INTRAMUSCULAR | Status: DC | PRN
  Administered 2020-02-23: 4 mg via INTRAVENOUS

## 2020-02-23 MED ORDER — LACTATED RINGERS IV SOLN: INTRAVENOUS | Status: DC | PRN

## 2020-02-23 MED ORDER — SUCCINYLCHOLINE CHLORIDE 200 MG/10ML IV SOSY
PREFILLED_SYRINGE | INTRAVENOUS | Status: DC | PRN
  Administered 2020-02-23: 120 mg via INTRAVENOUS

## 2020-02-23 MED ORDER — ONDANSETRON HCL 4 MG/2ML IJ SOLN
INTRAMUSCULAR | Status: AC
  Filled 2020-02-23: qty 6

## 2020-02-23 MED ORDER — SUCCINYLCHOLINE CHLORIDE 200 MG/10ML IV SOSY
PREFILLED_SYRINGE | INTRAVENOUS | Status: AC
  Filled 2020-02-23: qty 10

## 2020-02-23 MED ORDER — 0.9 % SODIUM CHLORIDE (POUR BTL) OPTIME
TOPICAL | Status: DC | PRN
  Administered 2020-02-23: 1000 mL

## 2020-02-23 MED ORDER — LIDOCAINE 2% (20 MG/ML) 5 ML SYRINGE
INTRAMUSCULAR | Status: DC | PRN
  Administered 2020-02-23: 80 mg via INTRAVENOUS

## 2020-02-23 MED ORDER — HYDROCODONE-ACETAMINOPHEN 5-325 MG PO TABS
ORAL_TABLET | ORAL | Status: AC
  Filled 2020-02-23: qty 2

## 2020-02-23 MED ORDER — FENTANYL CITRATE (PF) 100 MCG/2ML IJ SOLN
25.0000 ug | INTRAMUSCULAR | Status: DC | PRN
  Administered 2020-02-23: 50 ug via INTRAVENOUS

## 2020-02-23 MED ORDER — CEFAZOLIN SODIUM-DEXTROSE 1-4 GM/50ML-% IV SOLN
1.0000 g | Freq: Four times a day (QID) | INTRAVENOUS | Status: AC
  Administered 2020-02-23 – 2020-02-24 (×3): 1 g via INTRAVENOUS
  Filled 2020-02-23 (×3): qty 50

## 2020-02-23 MED ORDER — ALBUMIN HUMAN 5 % IV SOLN: INTRAVENOUS | Status: DC | PRN

## 2020-02-23 MED ORDER — PROPOFOL 10 MG/ML IV BOLUS
INTRAVENOUS | Status: DC | PRN
  Administered 2020-02-23: 150 mg via INTRAVENOUS

## 2020-02-23 MED ORDER — INSULIN ASPART 100 UNIT/ML ~~LOC~~ SOLN
4.0000 [IU] | Freq: Once | SUBCUTANEOUS | Status: AC
  Administered 2020-02-23: 4 [IU] via SUBCUTANEOUS

## 2020-02-23 MED ORDER — POTASSIUM CHLORIDE IN NACL 20-0.9 MEQ/L-% IV SOLN
INTRAVENOUS | Status: DC
  Filled 2020-02-23: qty 1000

## 2020-02-23 MED ORDER — ONDANSETRON HCL 4 MG/2ML IJ SOLN: 4.0000 mg | Freq: Four times a day (QID) | INTRAMUSCULAR | Status: DC | PRN

## 2020-02-23 MED ORDER — ONDANSETRON HCL 4 MG PO TABS: 4.0000 mg | ORAL_TABLET | Freq: Four times a day (QID) | ORAL | Status: DC | PRN

## 2020-02-23 MED ORDER — ACETAMINOPHEN 500 MG PO TABS
ORAL_TABLET | ORAL | Status: AC
  Administered 2020-02-23: 1000 mg via ORAL
  Filled 2020-02-23: qty 2

## 2020-02-23 SURGICAL SUPPLY — 97 items
BANDAGE ESMARK 6X9 LF (GAUZE/BANDAGES/DRESSINGS) ×1 IMPLANT
BIT DRILL 100X2.5XANTM LCK (BIT) ×1 IMPLANT
BIT DRILL 2.5X2.75 QC CALB (BIT) ×3 IMPLANT
BIT DRILL CAL (BIT) ×1 IMPLANT
BIT DRILL CANN 3.5MM (DRILL) ×1 IMPLANT
BIT DRL 100X2.5XANTM LCK (BIT) ×1
BLADE CLIPPER SURG (BLADE) IMPLANT
BLADE SURG 10 STRL SS (BLADE) ×3 IMPLANT
BLADE SURG 15 STRL LF DISP TIS (BLADE) ×1 IMPLANT
BLADE SURG 15 STRL SS (BLADE) ×2
BNDG COHESIVE 4X5 TAN STRL (GAUZE/BANDAGES/DRESSINGS) ×3 IMPLANT
BNDG ELASTIC 4X5.8 VLCR STR LF (GAUZE/BANDAGES/DRESSINGS) ×3 IMPLANT
BNDG ELASTIC 6X10 VLCR STRL LF (GAUZE/BANDAGES/DRESSINGS) ×3 IMPLANT
BNDG ELASTIC 6X15 VLCR STRL LF (GAUZE/BANDAGES/DRESSINGS) ×3 IMPLANT
BNDG ELASTIC 6X5.8 VLCR STR LF (GAUZE/BANDAGES/DRESSINGS) ×3 IMPLANT
BNDG ESMARK 6X9 LF (GAUZE/BANDAGES/DRESSINGS) ×3
BNDG GAUZE ELAST 4 BULKY (GAUZE/BANDAGES/DRESSINGS) ×6 IMPLANT
BRUSH SCRUB EZ PLAIN DRY (MISCELLANEOUS) ×6 IMPLANT
CANISTER SUCT 3000ML PPV (MISCELLANEOUS) ×3 IMPLANT
COVER SURGICAL LIGHT HANDLE (MISCELLANEOUS) ×6 IMPLANT
COVER WAND RF STERILE (DRAPES) ×3 IMPLANT
CUFF TOURN SGL QUICK 34 (TOURNIQUET CUFF) ×2
CUFF TRNQT CYL 34X4.125X (TOURNIQUET CUFF) ×1 IMPLANT
DRAPE C-ARM 42X72 X-RAY (DRAPES) ×3 IMPLANT
DRAPE C-ARMOR (DRAPES) ×3 IMPLANT
DRAPE HALF SHEET 40X57 (DRAPES) IMPLANT
DRAPE INCISE IOBAN 66X45 STRL (DRAPES) ×3 IMPLANT
DRAPE U-SHAPE 47X51 STRL (DRAPES) ×3 IMPLANT
DRESSING PREVENA PLUS CUSTOM (GAUZE/BANDAGES/DRESSINGS) ×1 IMPLANT
DRILL BIT 2.5MM (BIT) ×2
DRILL BIT CAL (BIT) ×3
DRILL CANN 3.5MM (DRILL) ×3
DRSG ADAPTIC 3X8 NADH LF (GAUZE/BANDAGES/DRESSINGS) ×3 IMPLANT
DRSG MEPILEX BORDER 4X4 (GAUZE/BANDAGES/DRESSINGS) ×3 IMPLANT
DRSG MEPITEL 4X7.2 (GAUZE/BANDAGES/DRESSINGS) ×6 IMPLANT
DRSG PAD ABDOMINAL 8X10 ST (GAUZE/BANDAGES/DRESSINGS) ×6 IMPLANT
DRSG PREVENA PLUS CUSTOM (GAUZE/BANDAGES/DRESSINGS) ×3
ELECT REM PT RETURN 9FT ADLT (ELECTROSURGICAL) ×3
ELECTRODE REM PT RTRN 9FT ADLT (ELECTROSURGICAL) ×1 IMPLANT
GAUZE SPONGE 4X4 12PLY STRL (GAUZE/BANDAGES/DRESSINGS) ×3 IMPLANT
GLOVE BIO SURGEON STRL SZ7.5 (GLOVE) ×3 IMPLANT
GLOVE BIO SURGEON STRL SZ8 (GLOVE) ×3 IMPLANT
GLOVE BIOGEL PI IND STRL 7.5 (GLOVE) ×1 IMPLANT
GLOVE BIOGEL PI IND STRL 8 (GLOVE) ×1 IMPLANT
GLOVE BIOGEL PI INDICATOR 7.5 (GLOVE) ×2
GLOVE BIOGEL PI INDICATOR 8 (GLOVE) ×2
GOWN STRL REUS W/ TWL LRG LVL3 (GOWN DISPOSABLE) ×2 IMPLANT
GOWN STRL REUS W/ TWL XL LVL3 (GOWN DISPOSABLE) ×1 IMPLANT
GOWN STRL REUS W/TWL LRG LVL3 (GOWN DISPOSABLE) ×4
GOWN STRL REUS W/TWL XL LVL3 (GOWN DISPOSABLE) ×2
IMMOBILIZER KNEE 22 UNIV (SOFTGOODS) ×3 IMPLANT
K-WIRE ACE 1.6X6 (WIRE) ×9
KIT BASIN OR (CUSTOM PROCEDURE TRAY) ×3 IMPLANT
KIT TURNOVER KIT B (KITS) ×3 IMPLANT
KWIRE ACE 1.6X6 (WIRE) ×3 IMPLANT
MANIFOLD NEPTUNE II (INSTRUMENTS) ×3 IMPLANT
NDL SUT 6 .5 CRC .975X.05 MAYO (NEEDLE) IMPLANT
NEEDLE MAYO TAPER (NEEDLE)
NS IRRIG 1000ML POUR BTL (IV SOLUTION) ×3 IMPLANT
PACK ORTHO EXTREMITY (CUSTOM PROCEDURE TRAY) ×3 IMPLANT
PAD ARMBOARD 7.5X6 YLW CONV (MISCELLANEOUS) ×6 IMPLANT
PAD CAST 4YDX4 CTTN HI CHSV (CAST SUPPLIES) ×1 IMPLANT
PADDING CAST COTTON 4X4 STRL (CAST SUPPLIES) ×2
PADDING CAST COTTON 6X4 STRL (CAST SUPPLIES) ×3 IMPLANT
PLATE LOCK 9H STD LT PROX TIB (Plate) ×3 IMPLANT
SCREW CANNULATED 5.0X70 (Screw) ×3 IMPLANT
SCREW CORTICAL 3.5MM  34MM (Screw) ×2 IMPLANT
SCREW CORTICAL 3.5MM 34MM (Screw) ×1 IMPLANT
SCREW CORTICAL 3.5MM 38MM (Screw) ×6 IMPLANT
SCREW CORTICAL 3.5MM 40MM (Screw) ×3 IMPLANT
SCREW CORTICAL 3.5MM 70MM (Screw) ×3 IMPLANT
SCREW LOCK CORT STAR 3.5X75 (Screw) ×3 IMPLANT
SCREW LOCK CORT STAR 3.5X80 (Screw) ×6 IMPLANT
SCREW LOCK CORT STAR 3.5X85 (Screw) ×6 IMPLANT
SCREW LOCK CORT STAR 3.5X90 (Screw) ×3 IMPLANT
SCREW LP 3.5X90MM (Screw) ×3 IMPLANT
SPONGE LAP 18X18 RF (DISPOSABLE) ×3 IMPLANT
STAPLER VISISTAT 35W (STAPLE) ×3 IMPLANT
STOCKINETTE IMPERVIOUS LG (DRAPES) ×3 IMPLANT
SUCTION FRAZIER HANDLE 10FR (MISCELLANEOUS) ×2
SUCTION TUBE FRAZIER 10FR DISP (MISCELLANEOUS) ×1 IMPLANT
SUT ETHILON 3 0 PS 1 (SUTURE) IMPLANT
SUT PROLENE 0 CT 2 (SUTURE) ×6 IMPLANT
SUT VIC AB 0 CT1 27 (SUTURE) ×2
SUT VIC AB 0 CT1 27XBRD ANBCTR (SUTURE) ×1 IMPLANT
SUT VIC AB 1 CT1 27 (SUTURE) ×2
SUT VIC AB 1 CT1 27XBRD ANBCTR (SUTURE) ×1 IMPLANT
SUT VIC AB 2-0 CT1 27 (SUTURE) ×4
SUT VIC AB 2-0 CT1 TAPERPNT 27 (SUTURE) ×2 IMPLANT
TOWEL GREEN STERILE (TOWEL DISPOSABLE) ×6 IMPLANT
TOWEL GREEN STERILE FF (TOWEL DISPOSABLE) ×6 IMPLANT
TRAY FOLEY MTR SLVR 16FR STAT (SET/KITS/TRAYS/PACK) IMPLANT
TUBE CONNECTING 12'X1/4 (SUCTIONS) ×1
TUBE CONNECTING 12X1/4 (SUCTIONS) ×2 IMPLANT
UNDERPAD 30X30 (UNDERPADS AND DIAPERS) ×3 IMPLANT
WATER STERILE IRR 1000ML POUR (IV SOLUTION) ×6 IMPLANT
YANKAUER SUCT BULB TIP NO VENT (SUCTIONS) ×3 IMPLANT

## 2020-02-23 NOTE — Op Note (Addendum)
NAME: Cameron Velasquez, Cameron Velasquez MEDICAL RECORD [id number] ACCOUNT [id number] DATE OF BIRTH:27-Jul-1956 FACILITY: [facility] LOCATION: [id number] PHYSICIAN:Medard Decuir H. Wavie Hashimi, MD  OPERATIVE REPORT  DATE OF PROCEDURE:  02/16/2020  PREOPERATIVE DIAGNOSES:   1.  Left bicondylar tibial plateau fracture. 2.  Open wound, left calf, 7 cm, lightly contaminated. 3.  Left knee hemarthrosis. 4.  Right open tibia fracture without displacement and a longitudinal 14 cm wound with contamination and exposed bone.  POSTOPERATIVE DIAGNOSES:  1.  Left bicondylar tibial plateau fracture. 2.  Open wound, left calf, 7 cm, lightly contaminated. 3.  Left knee hemarthrosis. 4.  Right open tibia fracture without displacement and a longitudinal 14 cm wound with contamination and exposed bone.  PROCEDURES:   1.  Closed reduction of left bicondylar tibial plateau fracture. 2.  Application of spanning external fixator. 3.  Irrigation and debridement with layered closure of left leg wound, 7 cm. 4.  Debridement of open fracture. 5.  Right tibia. 6.  Aspiration of left knee hemarthrosis, 45 mL. 7.  Application of wound VAC, right leg, open fracture wound.  SURGEON:  Altamese [place], MD  ASSISTANT: 1.  Ainsley Spinner PA-C 2.  PA student  ANESTHESIA:  General.  TOURNIQUET:  None.  SPECIMENS:  None.  DISPOSITION:  To PACU.  CONDITION:  Stable.    INDICATIONS FOR PROCEDURE:  The patient is a very pleasant 64 year old male who fell over 15 feet from a ladder sliding down the ladder sustaining significant trauma to both lower extremities with a bicondylar displaced fracture on the left as well as  disruption of the periosteum and a long longitudinal wound with contamination undermining on the right.  The patient does have underlying diabetes.  Also, sustained a pneumothorax and rib fractures for which he was seen by the trauma service who did  clear his cervical spine.  He was also seen preoperatively by the medical service  for careful attention to his diabetes to reduce the risk of complications related to these injuries and perioperative management of his medications.  I discussed with the  patient the increased possibility of infection and nonunion given his diabetes and the magnitude of injury with the open wounds, we also discussed DVT, PE, anesthetic complications such as heart attack, stroke, including malunion, nonunion, arthritis,  loss of motion, and others and he did wish to proceed.  BRIEF SUMMARY OF PROCEDURE:  The patient was given preoperative antibiotics on admission.  We brought him to the operating room where general anesthesia was induced.  Both lower extremities were prepped and draped in usual sterile fashion.  Prior to  doing that, we performed an aggressive chlorhexidine wash with soap and gentle irrigation and then a more formal chlorhexidine wash scrubbing all of the areas of his wounds including the left hip region and were able to then continue with the Betadine  scrub and paint.  We began on the right side where a longitudinal sharp excision with the scalpel was performed removing the margin of the wounds along the anterior crest, which extended beyond 15 cm.  I also excised the subcutaneous tissue, muscle,  fascia and with regard to the periosteum, carefully irrigated this, supplemented with soap and used a curette to remove some ground-in debris out of the disrupted bone itself.  There was no extension of the fracture to the far posterior cortex or  instability noted.  Following this, my assistant, Ainsley Spinner, was able to perform a layered closure using far-near-near-far sutures with PDS and then nylon.  A Prevena wound VAC was then applied and a sterile gently compressive dressing from foot to  thigh.  With regard to the left side, here again, this was prepared with Betadine scrub and paint after a chlorhexidine wash.  The wound was explored and did not communicate with the fracture site  proximally.  We were able to excise the skin edges and performed  a layered closure with 2-0 PDS and 2-0 nylon.  I then pulled longitudinal traction to reduce the bicondylar plateau and using 2 anterior pins in the femur and 2 in the tibia to place a clamps then 2 bars securing these with the bicondylar plateau in a  reduced position.  Sterile gently compressive dressing was applied using Mepitel over the wound and then gauze, Kerlix and Ace wraps from foot to thigh.  Montez Morita, PA-C as well as a PA student were present and assisting throughout.  Assistants was  necessary for securing of the clamps during the reduction and also this enabled Mr. Renae Fickle to proceed with closure on the right while I began on the left to expedite the patient's care in the operating room and reduce his overall operative time under  anesthesia.  PROGNOSIS:  The patient will need to return to the OR in 7-10 days for definitive reconstruction of his bicondylar plateau.  He remains at elevated risk because of his associated wounds and diabetes.  He will be weightbearing as tolerated on the right  lower extremity if he is able to do so comfortably with the supervision of physical therapy.  He will be on formal DVT prophylaxis with Lovenox.  CN/NUANCE  D:02/22/2020 T:02/23/2020 JOB:[id number]

## 2020-02-23 NOTE — Anesthesia Procedure Notes (Signed)
Procedure Name: Intubation Date/Time: 02/23/2020 1:25 PM Performed by: Elliot Dally, CRNA Pre-anesthesia Checklist: Patient identified, Emergency Drugs available, Suction available and Patient being monitored Patient Re-evaluated:Patient Re-evaluated prior to induction Oxygen Delivery Method: Circle System Utilized Preoxygenation: Pre-oxygenation with 100% oxygen Induction Type: IV induction Laryngoscope Size: Miller and 2 Grade View: Grade I Tube type: Oral Tube size: 7.5 mm Number of attempts: 1 Airway Equipment and Method: Stylet and Oral airway Placement Confirmation: ETT inserted through vocal cords under direct vision,  positive ETCO2 and breath sounds checked- equal and bilateral Secured at: 24 cm Tube secured with: Tape Dental Injury: Teeth and Oropharynx as per pre-operative assessment

## 2020-02-23 NOTE — Transfer of Care (Signed)
Immediate Anesthesia Transfer of Care Note  Patient: Cameron Velasquez  Procedure(s) Performed: OPEN REDUCTION INTERNAL FIXATION (ORIF) TIBIAL PLATEAU (Left Leg Lower) REMOVAL EXTERNAL FIXATION LEG (Left Leg Lower)  Patient Location: PACU and OB High Risk  Anesthesia Type:General  Level of Consciousness: awake and alert   Airway & Oxygen Therapy: Patient Spontanous Breathing and Patient connected to nasal cannula oxygen  Post-op Assessment: Report given to RN and Post -op Vital signs reviewed and stable  Post vital signs: Reviewed and stable  Last Vitals:  Vitals Value Taken Time  BP 168/50 02/23/20 1613  Temp    Pulse 93 02/23/20 1617  Resp 24 02/23/20 1617  SpO2 97 % 02/23/20 1617  Vitals shown include unvalidated device data.  Last Pain:  Vitals:   02/23/20 1613  TempSrc:   PainSc: 10-Worst pain ever      Patients Stated Pain Goal: 4 (02/23/20 1613)  Complications: No apparent anesthesia complications

## 2020-02-23 NOTE — Progress Notes (Signed)
PT Cancellation Note  Patient Details Name: Cameron Velasquez MRN: [id number] DOB: 1955-12-23   Cancelled Treatment:    Reason Eval/Treat Not Completed: Other (comment).  Had not left for surgery but wanted to hold PT and try to move post op.  Re-attempt as time and pt allow.   Ivar Drape 02/23/2020, 10:40 AM  Samul Dada, PT MS Acute Rehab Dept. Number: [facility] [phone] and [facility] [phone]

## 2020-02-23 NOTE — Progress Notes (Signed)
Patient's wedding band is in pink denture cup placed in bedside table along with glasses and cell phone and charging cord.

## 2020-02-23 NOTE — Progress Notes (Signed)
PROGRESS NOTE    Cameron Velasquez  DXI:[id number] DOB: 29-Jul-1956 DOA: 02/16/2020 PCP: Laurel Dimmer, FNP    Brief Narrative:  Cameron Velasquez is a 64 year old Caucasian male with past medical history remarkable for type 2 diabetes mellitus, essential hypertension, hyperlipidemia, GERD who was admitted to the trauma service following fall off of a ladder, roughly 15 feet high without loss of consciousness suffering multiple fractures and right-sided pneumothorax.  Trauma service requested Hospitalist assistance with medical comanagement.   Assessment & Plan:   Principal Problem:   Left tibial fracture Active Problems:   Leukocytosis   Essential hypertension   Diabetes mellitus type 2, controlled (HCC)   Fall from ladder   Acute renal failure: Resolved Creatinine peaked at 1.40 on 02/17/2020.  Suspect dehydration/prerenal azotemia.  Resolved with IV fluid hydration.  ACE inhibitor now restarted. --Cr 1.07-->0.90-->1.40-->1.02-->0.84-->0.91-->0.91 --Repeat BMP tomorrow   Type 2 diabetes mellitus Hemoglobin A1c 5.6, well controlled.  At home on Trulicity 1.5 mg [place] q Sunday, glimepiride 4 mg p.o. daily, Metformin 1000 mg twice daily. --Holding oral hypoglycemics and Trulicity while inpatient --Required 7 units for total insulin coverage past 24 hours --Insulin sliding scale for coverage while inpatient; if requires greater than 10 units of coverage insulin, will consider long-acting insulin at that time --CBGs 4 times daily qAC/HS  Essential hypertension --restarted lisinopril 20mg  PO daily 4/15 --Hydralazine 25 mg p.o. every 8 hours prn for SBP >170 or DBP >110 --Continue aspirin and statin  HLD: Continue Crestor 10 mg p.o. daily  Right seventh rib fracture with tension pneumothorax Underwent right pigtail thoracostomy by Dr. 5/15 on 02/17/2020.  Repeat chest x-ray this morning with right-sided chest tube in place without pneumothorax.  Chest tube placed to  waterseal on 02/19/2020, repeat x-ray 4/16 with chest tube in place with no pneumothorax.  Chest tube was removed 4/16 with repeat chest x-ray without any residual pneumothorax.  Trauma surgery now signed off with outpatient follow-up.  Patient breathing well without hypoxia, and no shortness of breath. --continue Incentive spirometry  Left bicondylar tibial plateau fracture Underwent external fixation, nonweightbearing left lower extremity.  Orthopedics plans for definitive fixation today.  Traumatic wounds right/left legs Underwent I&D with closure by orthopedics.  Further management per Ortho. --Pain control with Tylenol, tramadol, morphine, Robaxin   DVT prophylaxis: Lovenox Code Status: Full code Family Communication: Updated patient extensively at bedside Disposition Plan: Per primary, orthopedics; likely CIR versus SNF       Procedures:   Right pigtail tube thoracostomy - Dr. 5/16 02/17/2020, dc'd 02/20/2020  Antimicrobials:   Perioperative cefazolin 4/12 - 4/13  Irrigation debridement open fracture with external fixation 4/12 - Dr. 6/12   Subjective: Patient seen and examined at bedside, resting comfortably.  Pain controlled.  Glad the nurse was able to find a phone charger for him overnight.  Plan definitive treatment of his fracture today.  No complaints. Denies headache, no fever/chills/night sweats, no nausea/vomiting/diarrhea, no chest pain, no shortness of breath, no abdominal pain, no fatigue, no paresthesias.  No acute events overnight per nursing staff.  Objective: Vitals:   02/22/20 1338 02/22/20 1930 02/23/20 0353 02/23/20 0900  BP: 116/67 (!) 150/78 136/84 (!) 141/98  Pulse: 98 92 98 84  Resp: 18 18 18 17   Temp:  99 F (37.2 C) 98.3 F (36.8 C) 98.5 F (36.9 C)  TempSrc:  Oral Oral Oral  SpO2: 99% 96% 97% 99%  Weight:      Height:        Intake/Output  Summary (Last 24 hours) at 02/23/2020 1138 Last data filed at 02/22/2020 1930 Gross per 24 hour   Intake 240 ml  Output 950 ml  Net -710 ml   Filed Weights   02/16/20 1029  Weight: 120.2 kg    Examination:  General exam: Appears calm and comfortable  Respiratory system: Clear to auscultation. Respiratory effort normal. Oxygenating well on room air.  Cardiovascular system: S1 & S2 heard, RRR. No JVD, murmurs, rubs, gallops or clicks. No pedal edema. Gastrointestinal system: Abdomen is nondistended, soft and nontender. No organomegaly or masses felt. Normal bowel sounds heard. Central nervous system: Alert and oriented. No focal neurological deficits. Extremities: Symmetric 5 x 5 power.  Noted external fixation left lower extremity and wound VAC right lower extremity Skin: No rashes, lesions or ulcers Psychiatry: Judgement and insight appear normal. Mood & affect appropriate.     Data Reviewed: I have personally reviewed following labs and imaging studies  CBC: Recent Labs  Lab 02/17/20 0419 02/18/20 0249 02/19/20 0453 02/23/20 1105  WBC 15.1* 11.4* 11.4* 10.7*  HGB 11.7* 9.8* 9.5* 9.7*  HCT 35.2* 29.0* 28.6* 29.7*  MCV 91.2 90.9 90.5 94.6  PLT 197 153 174 542   Basic Metabolic Panel: Recent Labs  Lab 02/17/20 0419 02/17/20 0419 02/18/20 0249 02/19/20 0453 02/19/20 1933 02/20/20 0813 02/23/20 0531  NA 139  --  138 136  --  137 138  K 5.0   < > 4.1 3.9 3.8 3.7 3.4*  CL 105  --  105 101  --  101 102  CO2 22  --  25 26  --  26 24  GLUCOSE 207*  --  185* 207*  --  201* 201*  BUN 24*  --  17 15  --  19 18  CREATININE 1.40*  --  1.02 0.84  --  0.91 0.91  CALCIUM 8.2*  --  8.2* 8.5*  --  8.7* 8.9  MG  --   --   --   --  1.6*  --   --   PHOS  --   --  3.7  --   --   --   --    < > = values in this interval not displayed.   GFR: Estimated Creatinine Clearance: 106.8 mL/min (by C-G formula based on SCr of 0.91 mg/dL). Liver Function Tests: Recent Labs  Lab 02/17/20 0419 02/18/20 0249  AST 28  --   ALT 18  --   ALKPHOS 43  --   BILITOT 0.8  --   PROT 6.0*   --   ALBUMIN 3.4* 3.2*   No results for input(s): LIPASE, AMYLASE in the last 168 hours. No results for input(s): AMMONIA in the last 168 hours. Coagulation Profile: No results for input(s): INR, PROTIME in the last 168 hours. Cardiac Enzymes: Recent Labs  Lab 02/17/20 0947 02/18/20 0249  CKTOTAL 414* 504*   BNP (last 3 results) No results for input(s): PROBNP in the last 8760 hours. HbA1C: No results for input(s): HGBA1C in the last 72 hours. CBG: Recent Labs  Lab 02/22/20 0929 02/22/20 1250 02/22/20 1656 02/22/20 2030 02/23/20 0750  GLUCAP 256* 232* 180* 188* 200*   Lipid Profile: No results for input(s): CHOL, HDL, LDLCALC, TRIG, CHOLHDL, LDLDIRECT in the last 72 hours. Thyroid Function Tests: No results for input(s): TSH, T4TOTAL, FREET4, T3FREE, THYROIDAB in the last 72 hours. Anemia Panel: No results for input(s): VITAMINB12, FOLATE, FERRITIN, TIBC, IRON, RETICCTPCT in the last 72 hours.  Sepsis Labs: Recent Labs  Lab 02/16/20 1234 02/16/20 1540 02/18/20 0249  LATICACIDVEN 2.3* 2.8* 1.0    Recent Results (from the past 240 hour(s))  Respiratory Panel by RT PCR (Flu A&B, Covid) - Nasopharyngeal Swab     Status: None   Collection Time: 02/16/20 10:48 AM   Specimen: Nasopharyngeal Swab  Result Value Ref Range Status   SARS Coronavirus 2 by RT PCR NEGATIVE NEGATIVE Final    Comment: (NOTE) SARS-CoV-2 target nucleic acids are NOT DETECTED. The SARS-CoV-2 RNA is generally detectable in upper respiratoy specimens during the acute phase of infection. The lowest concentration of SARS-CoV-2 viral copies this assay can detect is 131 copies/mL. A negative result does not preclude SARS-Cov-2 infection and should not be used as the sole basis for treatment or other patient management decisions. A negative result may occur with  improper specimen collection/handling, submission of specimen other than nasopharyngeal swab, presence of viral mutation(s) within the areas  targeted by this assay, and inadequate number of viral copies (<131 copies/mL). A negative result must be combined with clinical observations, patient history, and epidemiological information. The expected result is Negative. Fact Sheet for Patients:  https://www.moore.com/ Fact Sheet for Healthcare Providers:  https://www.young.biz/ This test is not yet ap proved or cleared by the [place] FDA and  has been authorized for detection and/or diagnosis of SARS-CoV-2 by FDA under an Emergency Use Authorization (EUA). This EUA will remain  in effect (meaning this test can be used) for the duration of the COVID-19 declaration under Section 564(b)(1) of the Act, 21 U.S.C. section 360bbb-3(b)(1), unless the authorization is terminated or revoked sooner.    Influenza A by PCR NEGATIVE NEGATIVE Final   Influenza B by PCR NEGATIVE NEGATIVE Final    Comment: (NOTE) The Xpert Xpress SARS-CoV-2/FLU/RSV assay is intended as an aid in  the diagnosis of influenza from Nasopharyngeal swab specimens and  should not be used as a sole basis for treatment. Nasal washings and  aspirates are unacceptable for Xpert Xpress SARS-CoV-2/FLU/RSV  testing. Fact Sheet for Patients: https://www.moore.com/ Fact Sheet for Healthcare Providers: https://www.young.biz/ This test is not yet approved or cleared by the [place] FDA and  has been authorized for detection and/or diagnosis of SARS-CoV-2 by  FDA under an Emergency Use Authorization (EUA). This EUA will remain  in effect (meaning this test can be used) for the duration of the  Covid-19 declaration under Section 564(b)(1) of the Act, 21  U.S.C. section 360bbb-3(b)(1), unless the authorization is  terminated or revoked. Performed at [facility], [address], [place], [place] [address]   Surgical pcr screen     Status: None   Collection Time: 02/16/20  3:30 PM    Specimen: Nasal Mucosa; Nasal Swab  Result Value Ref Range Status   MRSA, PCR NEGATIVE NEGATIVE Final   Staphylococcus aureus NEGATIVE NEGATIVE Final    Comment: (NOTE) The Xpert SA Assay (FDA approved for NASAL specimens in patients 77 years of age and older), is one component of a comprehensive surveillance program. It is not intended to diagnose infection nor to guide or monitor treatment. Performed at [facility], [address], [place], [place] [address]          Radiology Studies: No results found.      Scheduled Meds: . acetaminophen  500 mg Oral Q12H  . vitamin C  500 mg Oral Daily  . aspirin  81 mg Oral Daily  . cholecalciferol  2,000 Units  Oral BID  . docusate sodium  100 mg Oral BID  . enoxaparin (LOVENOX) injection  40 mg Subcutaneous Q24H  . gabapentin  300 mg Oral QHS  . insulin aspart  0-9 Units Subcutaneous TID WC  . lisinopril  20 mg Oral Daily  . multivitamin with minerals  1 tablet Oral Daily  . pantoprazole  20 mg Oral Daily  . polyethylene glycol  17 g Oral Daily  . rosuvastatin  10 mg Oral QHS   Continuous Infusions: . lactated ringers Stopped (02/17/20 0100)  . methocarbamol (ROBAXIN) IV       LOS: 7 days    Time spent: 32 minutes spent on chart review, discussion with nursing staff, consultants, updating family and interview/physical exam; more than 50% of that time was spent in counseling and/or coordination of care.    Alvira Philips [place], DO [facility] Available via Epic secure chat 7am-7pm After these hours, please refer to coverage provider listed on [email or web address] 02/23/2020, 11:38 AM

## 2020-02-23 NOTE — Anesthesia Preprocedure Evaluation (Addendum)
Anesthesia Evaluation  Patient identified by MRN, date of birth, ID band Patient awake    Reviewed: Allergy & Precautions, NPO status , Patient's Chart, lab work & pertinent test results  Airway Mallampati: II  TM Distance: >3 FB Neck ROM: Full    Dental  (+) Teeth Intact, Dental Advisory Given, Caps,    Pulmonary former smoker,    Pulmonary exam normal breath sounds clear to auscultation       Cardiovascular hypertension, Pt. on medications Normal cardiovascular exam Rhythm:Regular Rate:Normal     Neuro/Psych negative neurological ROS  negative psych ROS   GI/Hepatic Neg liver ROS, GERD  Medicated,  Endo/Other  diabetes, Type 2, Oral Hypoglycemic AgentsObesity   Renal/GU negative Renal ROS     Musculoskeletal negative musculoskeletal ROS (+)   Abdominal   Peds  Hematology  (+) Blood dyscrasia, anemia ,   Anesthesia Other Findings Day of surgery medications reviewed with the patient.  Reproductive/Obstetrics                           Anesthesia Physical Anesthesia Plan  ASA: II  Anesthesia Plan: General   Post-op Pain Management:    Induction: Intravenous  PONV Risk Score and Plan: 2 and Midazolam, Dexamethasone and Ondansetron  Airway Management Planned: Oral ETT  Additional Equipment:   Intra-op Plan:   Post-operative Plan: Extubation in OR  Informed Consent: I have reviewed the patients History and Physical, chart, labs and discussed the procedure including the risks, benefits and alternatives for the proposed anesthesia with the patient or authorized representative who has indicated his/her understanding and acceptance.     Dental advisory given  Plan Discussed with: CRNA  Anesthesia Plan Comments:         Anesthesia Quick Evaluation

## 2020-02-23 NOTE — Anesthesia Postprocedure Evaluation (Signed)
Anesthesia Post Note  Patient: Cameron Velasquez  Procedure(s) Performed: OPEN REDUCTION INTERNAL FIXATION (ORIF) TIBIAL PLATEAU (Left Leg Lower) REMOVAL EXTERNAL FIXATION LEG (Left Leg Lower)     Patient location during evaluation: PACU Anesthesia Type: General Level of consciousness: awake Pain management: pain level controlled Vital Signs Assessment: post-procedure vital signs reviewed and stable Respiratory status: spontaneous breathing, nonlabored ventilation, respiratory function stable and patient connected to nasal cannula oxygen Cardiovascular status: blood pressure returned to baseline and stable Postop Assessment: no apparent nausea or vomiting Anesthetic complications: no    Last Vitals:  Vitals:   02/23/20 1715 02/23/20 1743  BP:  (!) 170/89  Pulse: 86 83  Resp: 14 17  Temp: 36.7 C 36.9 C  SpO2: 99% 90%    Last Pain:  Vitals:   02/23/20 1743  TempSrc: Oral  PainSc:                  Delton Stelle P Melanee Cordial

## 2020-02-24 ENCOUNTER — Encounter: Payer: Self-pay | Admitting: *Deleted

## 2020-02-24 LAB — CBC
HCT: 25.7 % — ABNORMAL LOW (ref 39.0–52.0)
Hemoglobin: 8.4 g/dL — ABNORMAL LOW (ref 13.0–17.0)
MCH: 31 pg (ref 26.0–34.0)
MCHC: 32.7 g/dL (ref 30.0–36.0)
MCV: 94.8 fL (ref 80.0–100.0)
Platelets: 283 10*3/uL (ref 150–400)
RBC: 2.71 MIL/uL — ABNORMAL LOW (ref 4.22–5.81)
RDW: 14.5 % (ref 11.5–15.5)
WBC: 10.6 10*3/uL — ABNORMAL HIGH (ref 4.0–10.5)
nRBC: 0 % (ref 0.0–0.2)

## 2020-02-24 LAB — BASIC METABOLIC PANEL
Anion gap: 10 (ref 5–15)
BUN: 21 mg/dL (ref 8–23)
CO2: 25 mmol/L (ref 22–32)
Calcium: 8.6 mg/dL — ABNORMAL LOW (ref 8.9–10.3)
Chloride: 104 mmol/L (ref 98–111)
Creatinine, Ser: 0.93 mg/dL (ref 0.61–1.24)
GFR calc Af Amer: >60 mL/min (ref >60)
GFR calc non Af Amer: >60 mL/min (ref >60)
Glucose, Bld: 241 mg/dL — ABNORMAL HIGH (ref 70–99)
Potassium: 4.2 mmol/L (ref 3.5–5.1)
Sodium: 139 mmol/L (ref 135–145)

## 2020-02-24 LAB — GLUCOSE, CAPILLARY
Glucose-Capillary: 160 mg/dL — ABNORMAL HIGH (ref 70–99)
Glucose-Capillary: 223 mg/dL — ABNORMAL HIGH (ref 70–99)
Glucose-Capillary: 242 mg/dL — ABNORMAL HIGH (ref 70–99)
Glucose-Capillary: 198 mg/dL — ABNORMAL HIGH (ref 70–99)

## 2020-02-24 MED ORDER — INSULIN ASPART 100 UNIT/ML ~~LOC~~ SOLN
0.0000 [IU] | Freq: Three times a day (TID) | SUBCUTANEOUS | Status: DC
  Administered 2020-02-24 – 2020-02-25 (×3): 3 [IU] via SUBCUTANEOUS
  Administered 2020-02-25 – 2020-02-26 (×3): 2 [IU] via SUBCUTANEOUS

## 2020-02-24 MED ORDER — INSULIN ASPART 100 UNIT/ML ~~LOC~~ SOLN
0.0000 [IU] | Freq: Every day | SUBCUTANEOUS | Status: DC
  Administered 2020-02-25: 2 [IU] via SUBCUTANEOUS

## 2020-02-24 NOTE — Progress Notes (Signed)
Pt has hinge brace on, locked in extension.

## 2020-02-24 NOTE — Progress Notes (Signed)
Occupational Therapy Treatment Patient Details Name: Cameron Velasquez MRN: [id number] DOB: 11-Mar-1956 Today's Date: 02/24/2020    History of present illness Cameron Velasquez is an 64 y.o. male admitted 02/16/20 after a fall from ladder, 61ft, resulting in left tibia fx, right 7th rib fx, right LE laceration. S/p I&D, closed reduction, external fixation of LLE. s/p I&D of RLE laceration. Pt with R pneumothorax and is s/p chest tube insertion. S/p LLE ex fix removal with ORIF 4/19. PMH includes DM, HTN.   OT comments  Pt progressing toward established OT goals. Pt sitting in recliner upon arrival, agreeable to OT session. Pt required modA to don knee immobilizer. He required minguard to stand from recliner and minA to transfer back to bed taking 4 hop steps. Pt with increased DoE 3/4 following mobility, SpO2 97% RA. Pt will continue to benefit from skilled OT services to maximize safety and independence with ADL/IADL and functional mobility. Will continue to follow acutely and progress as tolerated.    Follow Up Recommendations  CIR;Supervision/Assistance - 24 hour    Equipment Recommendations  3 in 1 bedside commode    Recommendations for Other Services      Precautions / Restrictions Precautions Precautions: Fall Precaution Comments: no Lknee ROM;maintain knee extension Restrictions Weight Bearing Restrictions: Yes RLE Weight Bearing: Weight bearing as tolerated LLE Weight Bearing: Non weight bearing Other Position/Activity Restrictions: No ROM restrictions RLE       Mobility Bed Mobility Overal bed mobility: Needs Assistance Bed Mobility: Sit to Supine     Supine to sit: Mod assist Sit to supine: Min assist   General bed mobility comments: for LLE management  Transfers Overall transfer level: Needs assistance Equipment used: Rolling walker (2 wheeled) Transfers: Sit to/from Stand Sit to Stand: Min guard Stand pivot transfers: Min assist       General transfer  comment: minguard for safety    Balance Overall balance assessment: Needs assistance Sitting-balance support: Feet supported Sitting balance-Leahy Scale: Fair     Standing balance support: Bilateral upper extremity supported Standing balance-Leahy Scale: Poor Standing balance comment: Reliant on UE and external support                            ADL either performed or assessed with clinical judgement   ADL Overall ADL's : Needs assistance/impaired Eating/Feeding: Set up;Sitting Eating/Feeding Details (indicate cue type and reason): left pt with lunch tray         Lower Body Bathing: Moderate assistance Lower Body Bathing Details (indicate cue type and reason): modA to access BLE minguard for sit<>stand     Lower Body Dressing: Moderate assistance;Sit to/from stand Lower Body Dressing Details (indicate cue type and reason): modA to don knee immobilizer;minguard to stand Toilet Transfer: Minimal assistance;Ambulation Toilet Transfer Details (indicate cue type and reason): 4 steps from recliner to bed with minA for stability  Toileting- Clothing Manipulation and Hygiene: Min guard;Sit to/from stand       Functional mobility during ADLs: Minimal assistance;Rolling walker       Vision   Vision Assessment?: No apparent visual deficits   Perception     Praxis      Cognition Arousal/Alertness: Awake/alert Behavior During Therapy: WFL for tasks assessed/performed Overall Cognitive Status: Within Functional Limits for tasks assessed  Exercises Other Exercises Other Exercises: Heel cord stretch with pt using sheet to assist; able to tolerate static standing with LLE in dependent position for ~15 sec intervals; tolerated some L knee flex seated at edge of recliner; unable to perform LAQ, minimal quad activation   Shoulder Instructions       General Comments SpO2 >97% RA     Pertinent Vitals/  Pain       Pain Assessment: 0-10 Pain Score: 6  Faces Pain Scale: Hurts even more Pain Location: LLE Pain Descriptors / Indicators: Grimacing;Guarding;Sore Pain Intervention(s): Monitored during session;Limited activity within patient's tolerance;Patient requesting pain meds-RN notified  Home Living                                          Prior Functioning/Environment              Frequency  Min 2X/week        Progress Toward Goals  OT Goals(current goals can now be found in the care plan section)  Progress towards OT goals: Progressing toward goals  Acute Rehab OT Goals Patient Stated Goal: Rehab at [facility] before return home OT Goal Formulation: With patient Time For Goal Achievement: 03/05/20 Potential to Achieve Goals: Good ADL Goals Pt Will Perform Grooming: standing;with supervision Pt Will Perform Lower Body Dressing: with adaptive equipment;sit to/from stand;with set-up Pt Will Transfer to Toilet: ambulating;with min guard assist Pt Will Perform Toileting - Clothing Manipulation and hygiene: sit to/from stand;with supervision Additional ADL Goal #1: Pt will progress to EOB with modified independence in preparation for ADL.  Plan Discharge plan remains appropriate    Co-evaluation                 AM-PAC OT "6 Clicks" Daily Activity     Outcome Measure   Help from another person eating meals?: A Little Help from another person taking care of personal grooming?: A Little Help from another person toileting, which includes using toliet, bedpan, or urinal?: A Lot Help from another person bathing (including washing, rinsing, drying)?: A Lot Help from another person to put on and taking off regular upper body clothing?: A Little Help from another person to put on and taking off regular lower body clothing?: A Lot 6 Click Score: 15    End of Session Equipment Utilized During Treatment: Rolling walker  OT Visit Diagnosis: Unsteadiness on  feet (R26.81);Other abnormalities of gait and mobility (R26.89);Pain Pain - Right/Left: Right Pain - part of body: Leg   Activity Tolerance Patient tolerated treatment well   Patient Left in bed;with call bell/phone within reach;with bed alarm set   Nurse Communication Mobility status;Patient requests pain meds        Time: 3474-2595 OT Time Calculation (min): 28 min  Charges: OT General Charges $OT Visit: 1 Visit OT Treatments $Self Care/Home Management : 23-37 mins  Rosey Bath OTR/L Acute Rehabilitation Services Office: [phone]    Rebeca Alert 02/24/2020, 12:36 PM

## 2020-02-24 NOTE — PMR Pre-admission (Signed)
PMR Admission Coordinator Pre-Admission Assessment  Patient: Cameron Velasquez is an 64 y.o., male MRN: [id number] DOB: Mar 30, 1956 Height: 5' 11"  (180.3 cm) Weight: 120.2 kg  Insurance Information HMO:     PPO:      PCP:      IPA:      80/20: yes     OTHER:  PRIMARY: Medicare A and B      Policy#: [id number]      Subscriber: patient CM Name:       Phone#:      Fax#:  Pre-Cert#:       Employer:  Benefits:  Phone #: verified online     Name: verified eligibility online via [place] on 02/25/20 Eff. Date: Part A and B effective 01/05/20     Deduct: $1,484      Out of Pocket Max: NA      Life Max: NA CIR: Covered per Medicare guidelines once yearly deductible has been met      SNF: days 1-20, 100%; days 21-100, 80%.  Outpatient: 80%     Co-Pay: 20% Home Health: 100%      Co-Pay:  DME: 80%     Co-Pay: 20% Providers: Pt's choice  SECONDARY: None       Policy#:      Phone#:   Development worker, community:       Phone#:   The Engineer, petroleum" for patients in Inpatient Rehabilitation Facilities with attached "Privacy Act [place] Records" was provided and verbally reviewed with: Patient  Emergency Contact Information Contact Information    Name Relation Home Work Mobile   Constatelo,Kathryn Spouse   [phone]      Current Medical History  Patient Admitting Diagnosis: multi-ortho trauma after fall from 15 foot ladder  History of Present Illness: Pt is a 64 yo male who fell off a ladder from 15 feet. Post fall, he had immediate lower extremity pain and was admitted to [facility] on 4/12 as a level 2 trauma. Upon admission, pt complained of right shoulder, left leg, and right rib cage pain. Work-up revealed mild displaced right seventh rib fracture with small right basilar pneumothorax, comminuted fracture of the proximal tibia on the left, and a right lower extremity laceration. On 4/12, pt underwent surgery with Dr. Marcelino Scot for closed reduction of left bicondylar tibial  plateau fracture, application of spanning external fixator, irrigation and debridement with layered closure of left leg wound, debridement of open fracture, right tibia, aspiration of left knee hemarthrosis, and application of a wound VAC on the right leg for open fracture wound. On 4/13, pt underwent a right pigtail thoracostomy by Dr. Donne Hazel for tension pneumothorax. Chest tube was pulled on 4/16 with no residual pnemothorax. On 4/19, pt returned to OR for removal of external fixation and ORIF of left bicondylar tibial plateau fracture. Pt is to be NWB LLE with no knee ROM x2 weeks with hinged brace. Pt was seen by therapies with recommendation for CIR. Pt is to admit to CIR on 02/26/20.     Patient's medical record from [facility] has been reviewed by the rehabilitation admission coordinator and physician.  Past Medical History  Past Medical History:  Diagnosis Date  . Diabetes mellitus without complication ([facility])   . Hypertension     Family History   family history is not on file.  Prior Rehab/Hospitalizations Has the patient had prior rehab or hospitalizations prior to admission? No  Has the patient had major surgery during 100 days prior  to admission? Yes   Current Medications  Current Facility-Administered Medications:  .  acetaminophen (TYLENOL) tablet 325-650 mg, 325-650 mg, Oral, Q6H PRN, Ainsley Spinner, PA-C .  acetaminophen (TYLENOL) tablet 500 mg, 500 mg, Oral, Q12H, Ainsley Spinner, PA-C, 500 mg at 02/26/20 0949 .  ascorbic acid (VITAMIN C) tablet 500 mg, 500 mg, Oral, Daily, Ainsley Spinner, PA-C, 500 mg at 02/26/20 0949 .  aspirin chewable tablet 81 mg, 81 mg, Oral, Daily, Ainsley Spinner, PA-C, 81 mg at 02/26/20 0949 .  carvedilol (COREG) tablet 3.125 mg, 3.125 mg, Oral, BID WC, Pokhrel, Laxman, MD .  cholecalciferol (VITAMIN D3) tablet 2,000 Units, 2,000 Units, Oral, BID, Ainsley Spinner, PA-C, 2,000 Units at 02/26/20 2426 .  docusate sodium (COLACE) capsule 100 mg, 100  mg, Oral, BID, Ainsley Spinner, PA-C, 100 mg at 02/26/20 0949 .  enoxaparin (LOVENOX) injection 40 mg, 40 mg, Subcutaneous, Q24H, Ainsley Spinner, PA-C, 40 mg at 02/26/20 0948 .  gabapentin (NEURONTIN) capsule 300 mg, 300 mg, Oral, QHS, Ainsley Spinner, PA-C, 300 mg at 02/25/20 2229 .  hydrALAZINE (APRESOLINE) tablet 25 mg, 25 mg, Oral, Q8H PRN, Ainsley Spinner, PA-C, 25 mg at 02/18/20 2100 .  HYDROcodone-acetaminophen (NORCO) 7.5-325 MG per tablet 1-2 tablet, 1-2 tablet, Oral, Q4H PRN, Ainsley Spinner, PA-C, 2 tablet at 02/25/20 0157 .  HYDROcodone-acetaminophen (NORCO/VICODIN) 5-325 MG per tablet 1-2 tablet, 1-2 tablet, Oral, Q4H PRN, Ainsley Spinner, PA-C, 2 tablet at 02/26/20 0954 .  insulin aspart (novoLOG) injection 0-5 Units, 0-5 Units, Subcutaneous, QHS, [place] Eric J, DO, 2 Units at 02/25/20 2230 .  insulin aspart (novoLOG) injection 0-9 Units, 0-9 Units, Subcutaneous, TID WC, [place] Eric J, DO, 2 Units at 02/26/20 8341 .  lactated ringers infusion, , Intravenous, Continuous, Ainsley Spinner, PA-C, Last Rate: 10 mL/hr at 02/23/20 1644, New Bag at 02/23/20 1644 .  lisinopril (ZESTRIL) tablet 20 mg, 20 mg, Oral, Daily, Ainsley Spinner, PA-C, 20 mg at 02/26/20 0949 .  methocarbamol (ROBAXIN) tablet 500-1,000 mg, 500-1,000 mg, Oral, Q6H PRN, 1,000 mg at 02/26/20 0646 **OR** methocarbamol (ROBAXIN) 500 mg in dextrose 5 % 50 mL IVPB, 500 mg, Intravenous, Q6H PRN, Ainsley Spinner, PA-C .  metoCLOPramide (REGLAN) tablet 5-10 mg, 5-10 mg, Oral, Q8H PRN **OR** metoCLOPramide (REGLAN) injection 5-10 mg, 5-10 mg, Intravenous, Q8H PRN, Ainsley Spinner, PA-C .  morphine 2 MG/ML injection 0.5-1 mg, 0.5-1 mg, Intravenous, Q2H PRN, Ainsley Spinner, PA-C, 1 mg at 02/24/20 0212 .  multivitamin with minerals tablet 1 tablet, 1 tablet, Oral, Daily, Ainsley Spinner, PA-C, 1 tablet at 02/26/20 0949 .  ondansetron (ZOFRAN) tablet 4 mg, 4 mg, Oral, Q6H PRN **OR** ondansetron (ZOFRAN) injection 4 mg, 4 mg, Intravenous, Q6H PRN, Ainsley Spinner, PA-C .  pantoprazole  (PROTONIX) EC tablet 20 mg, 20 mg, Oral, Daily, Ainsley Spinner, PA-C, 20 mg at 02/26/20 0948 .  polyethylene glycol (MIRALAX / GLYCOLAX) packet 17 g, 17 g, Oral, Daily, Ainsley Spinner, PA-C, 17 g at 02/21/20 1007 .  rosuvastatin (CRESTOR) tablet 10 mg, 10 mg, Oral, QHS, Ainsley Spinner, PA-C, 10 mg at 02/25/20 2229 .  traMADol (ULTRAM) tablet 50-100 mg, 50-100 mg, Oral, Q8H PRN, Ainsley Spinner, PA-C, 50 mg at 02/24/20 1828  Patients Current Diet:  Diet Order            Diet Carb Modified Fluid consistency: Thin; Room service appropriate? Yes  Diet effective now              Precautions / Restrictions Precautions Precautions: Fall, Other (comment) Precaution Comments: LLE wound  vac Other Brace: Bledsoe brace locked in extension Restrictions Weight Bearing Restrictions: Yes RLE Weight Bearing: Weight bearing as tolerated LLE Weight Bearing: Non weight bearing Other Position/Activity Restrictions: No ROM L knee; unrestricted ROM RLE   Has the patient had 2 or more falls or a fall with injury in the past year? Yes  Prior Activity Level Community (5-7x/wk): works full time as Biomedical scientist for [employer]; he is caregiver for his wife.   Prior Functional Level Self Care: Did the patient need help bathing, dressing, using the toilet or eating? Independent  Indoor Mobility: Did the patient need assistance with walking from room to room (with or without device)? Independent  Stairs: Did the patient need assistance with internal or external stairs (with or without device)? Independent  Functional Cognition: Did the patient need help planning regular tasks such as shopping or remembering to take medications? Independent  Home Equities trader / Equipment Home Assistive Devices/Equipment: None Home Equipment: Shower seat, Grab bars - tub/shower, Grab bars - toilet, Walker - 2 wheels, Walker - 4 wheels  Prior Device Use: Indicate devices/aids used by the patient prior to current illness, exacerbation  or injury? None of the above  Current Functional Level Cognition  Overall Cognitive Status: Within Functional Limits for tasks assessed Orientation Level: Oriented X4    Extremity Assessment (includes Sensation/Coordination)  Upper Extremity Assessment: Overall WFL for tasks assessed RUE Deficits / Details: reports pain in ribs with movement but otherwise WFL RUE: Unable to fully assess due to pain  Lower Extremity Assessment: Defer to PT evaluation RLE Deficits / Details: pt reports no pain with weight bearing. able to perform SLR LLE Deficits / Details: LLE to be maintained in extension    ADLs  Overall ADL's : Needs assistance/impaired Eating/Feeding: Set up, Sitting Eating/Feeding Details (indicate cue type and reason): left pt with lunch tray Grooming: Min guard, Sitting Upper Body Bathing: Minimal assistance, Sitting Lower Body Bathing: Moderate assistance Lower Body Bathing Details (indicate cue type and reason): modA to access BLE minguard for sit<>stand Upper Body Dressing : Minimal assistance, Sitting Lower Body Dressing: Moderate assistance, Sit to/from stand Lower Body Dressing Details (indicate cue type and reason): modA to don knee immobilizer;minguard to stand Toilet Transfer: Minimal assistance, Ambulation Toilet Transfer Details (indicate cue type and reason): 4 steps from recliner to bed with minA for stability  Toileting- Clothing Manipulation and Hygiene: Min guard, Sit to/from stand Toileting - Clothing Manipulation Details (indicate cue type and reason): pt's saturated in urine, modA for thorough pericare Functional mobility during ADLs: Minimal assistance, Rolling walker General ADL Comments: pt limited by pain, decreased activity tolerance, elevated HR up to 123 with standing    Mobility  Overal bed mobility: Needs Assistance Bed Mobility: Sit to Supine Supine to sit: Mod assist Sit to supine: Min assist General bed mobility comments: Received sitting in  recliner    Transfers  Overall transfer level: Needs assistance Equipment used: Rolling walker (2 wheeled) Transfers: Sit to/from Stand Sit to Stand: Min guard, Mod assist Stand pivot transfers: Min assist General transfer comment: Reliant on momentum to power into standing; modA for LLE management in order to maintain LLE NWB precautions when going to sit as pt unable to lift leg    Ambulation / Gait / Stairs / Wheelchair Mobility  Ambulation/Gait Ambulation/Gait assistance: Min guard, Min assist Gait Distance (Feet): 30 Feet Assistive device: Rolling walker (2 wheeled) General Gait Details: Improving ability to hop on RLE with RW in order to maintain  LLE NWB precautions; pt required 4x standing rest break secondary to SOB and fatigue, limited by R-side rib pain and LLE pain; intermittent minA to prevent LOB; cues for pursed lip breathing Gait velocity: Decreased    Posture / Balance Balance Overall balance assessment: Needs assistance Sitting-balance support: Feet supported Sitting balance-Leahy Scale: Fair Standing balance support: Bilateral upper extremity supported Standing balance-Leahy Scale: Poor Standing balance comment: Reliant on UE and external support     Special needs/care consideration Wound Vac : yes, to LLE, Skin: abrasion to right knee, surgical incision to left leg, surgical incision to right leg.  Diabetic management: yes Designated visitor : unknown at this time.    Previous Home Environment (from acute therapy documentation) Living Arrangements: Spouse/significant other Available Help at Discharge: Family Type of Home: House Home Layout: One level Home Access: Stairs to enter Entrance Stairs-Rails: None Technical brewer of Steps: 2 Bathroom Shower/Tub: Multimedia programmer: Programmer, systems: Yes How Accessible: Accessible via walker [place]: No Additional Comments: pt's daughters coming in from out of town, son  and daughter-in-law live 5 min away  Discharge Living Setting Plans for Discharge Living Setting: Patient's home, Lives with (comment)(wife) Type of Home at Discharge: House Discharge Home Layout: One level Discharge Home Access: Stairs to enter Entrance Stairs-Rails: Right Entrance Stairs-Number of Steps: 4 Discharge Bathroom Shower/Tub: Tub/shower unit, Walk-in shower Discharge Bathroom Toilet: Standard(but can put elevated seat over it) Discharge Bathroom Accessibility: Yes How Accessible: Accessible via walker Does the patient have any problems obtaining your medications?: No  Social/Family/Support Systems Patient Roles: Spouse, Other (Comment)(full time employee) Contact Information: wife: Lyndel Pleasure [phone]; son: Larkin Ina [phone] Anticipated Caregiver: wife and son (as needed but he works 12 hr shifts) Anticipated Ambulance person Information: see above Ability/Limitations of Caregiver: supervision/light min A (wife has limitations and uses rollator herself) Caregiver Availability: 24/7 Discharge Plan Discussed with Primary Caregiver: Yes(wife) Is Caregiver In Agreement with Plan?: Yes Does Caregiver/Family have Issues with Lodging/Transportation while Pt is in Rehab?: No  Goals Patient/Family Goal for Rehab: PT/OT: Mod I/Supervision; SLP: NA Expected length of stay: 5-8 days Cultural Considerations: NA Pt/Family Agrees to Admission and willing to participate: Yes Program Orientation Provided & Reviewed with Pt/Caregiver Including Roles  & Responsibilities: Yes(pt and his wife)  Barriers to Discharge: Home environment access/layout, Lack of/limited family support, Weight bearing restrictions  Barriers to Discharge Comments: caregiver is limited in physical assist; steps to enter home.   Decrease burden of Care through IP rehab admission: NA  Possible need for SNF placement upon discharge: Not anticipated; pt has supervision from his wife at DC and anticipate he can  progress to supervision level quickly after CIR stay.   Patient Condition: I have reviewed medical records from [facility], spoken with PA, RN, and patient and spouse. I met with patient at the bedside for inpatient rehabilitation assessment.  Patient will benefit from ongoing PT and OT, can actively participate in 3 hours of therapy a day 5 days of the week, and can make measurable gains during the admission.  Patient will also benefit from the coordinated team approach during an Inpatient Acute Rehabilitation admission.  The patient will receive intensive therapy as well as Rehabilitation physician, nursing, social worker, and care management interventions.  Due to safety, skin/wound care, disease management, medication administration, pain management and patient education the patient requires 24 hour a day rehabilitation nursing.  The patient is currently Min G/Min A with mobility and Min G to Mod  A for basic ADLs.  Discharge setting and therapy post discharge at home with home health is anticipated.  Patient has agreed to participate in the Acute Inpatient Rehabilitation Program and will admit 02/26/20.  Preadmission Screen Completed By:  Raechel Ache, 02/26/2020 12:01 PM ______________________________________________________________________   Discussed status with Dr. Dagoberto Ligas on 02/26/20 at 12:01PM and received approval for admission today.  Admission Coordinator:  Raechel Ache, OT, time 12:01PM/Date 02/26/20   Assessment/Plan: Diagnosis: 1. Does the need for close, 24 hr/day Medical supervision in concert with the patient's rehab needs make it unreasonable for this patient to be served in a less intensive setting? Yes 2. Co-Morbidities requiring supervision/potential complications: NWB LLE- wound vac in place; R rib fx, s/p tension pneumothorax s/p chest tube 3. Due to bladder management, safety, skin/wound care, disease management, medication administration, pain management and  patient education, does the patient require 24 hr/day rehab nursing? Yes 4. Does the patient require coordinated care of a physician, rehab nurse, PT, OT, and SLP to address physical and functional deficits in the context of the above medical diagnosis(es)? Yes Addressing deficits in the following areas: balance, endurance, locomotion, strength, transferring, bathing, dressing, feeding, grooming and toileting 5. Can the patient actively participate in an intensive therapy program of at least 3 hrs of therapy 5 days a week? Yes 6. The potential for patient to make measurable gains while on inpatient rehab is good 7. Anticipated functional outcomes upon discharge from inpatient rehab: modified independent and supervision PT, modified independent and supervision OT, n/a SLP 8. Estimated rehab length of stay to reach the above functional goals is: 5-8 days 9. Anticipated discharge destination: Home 10. Overall Rehab/Functional Prognosis: good   MD Signature:

## 2020-02-24 NOTE — Progress Notes (Signed)
Orthopedic Tech Progress Note Patient Details:  Cameron Velasquez 1955/12/07 [id number] Called in order to [facility] for a ROM KNEE BRACE LOCKED   Patient ID: Cameron Velasquez, male   DOB: 01/04/1956, 64 y.o.   MRN: [id number]   Cameron Velasquez 02/24/2020, 12:22 PM

## 2020-02-24 NOTE — Plan of Care (Signed)
  Problem: Education: Goal: Knowledge of General Education information will improve Description: Including pain rating scale, medication(s)/side effects and non-pharmacologic comfort measures Outcome: Progressing   Problem: Health Behavior/Discharge Planning: Goal: Ability to manage health-related needs will improve Outcome: Progressing   Problem: Clinical Measurements: Goal: Ability to maintain clinical measurements within normal limits will improve Outcome: Progressing   Problem: Activity: Goal: Risk for activity intolerance will decrease Outcome: Progressing   Problem: Pain Managment: Goal: General experience of comfort will improve Outcome: Progressing   Problem: Safety: Goal: Ability to remain free from injury will improve Outcome: Progressing   

## 2020-02-24 NOTE — Progress Notes (Signed)
Orthopaedic Trauma Service Progress Note  Patient ID: Cameron Velasquez MRN: [id number] DOB/AGE: 05-23-1956 64 y.o.  Subjective:  Doing well overall Moderate pain but not too bad   No evidence of PTX on post op CXR yesterday No SOB today   ROS As above  Objective:   VITALS:   Vitals:   02/23/20 1743 02/23/20 2100 02/24/20 0300 02/24/20 0726  BP: (!) 170/89 (!) 160/82 (!) 141/82 (!) 144/87  Pulse: 83 95 79 78  Resp: 17 16 18 19   Temp: 98.4 F (36.9 C) 98.4 F (36.9 C) 98.3 F (36.8 C) 98.1 F (36.7 C)  TempSrc: Oral Oral Oral Oral  SpO2: 90% 96% 97% 98%  Weight:      Height:        Estimated body mass index is 36.96 kg/m as calculated from the following:   Height as of this encounter: 5\' 11"  (1.803 m).   Weight as of this encounter: 120.2 kg.   Intake/Output      04/19 0701 - 04/20 0700 04/20 0701 - 04/21 0700   P.O. 120    I.V. (mL/kg) 2730.3 (22.7)    IV Piggyback 300    Total Intake(mL/kg) 3150.3 (26.2)    Urine (mL/kg/hr) 1200 (0.4)    Drains 0    Blood 225    Total Output 1425    Net +1725.3           LABS  Results for orders placed or performed during the hospital encounter of 02/16/20 (from the past 24 hour(s))  CBC     Status: Abnormal   Collection Time: 02/23/20 11:05 AM  Result Value Ref Range   WBC 10.7 (H) 4.0 - 10.5 K/uL   RBC 3.14 (L) 4.22 - 5.81 MIL/uL   Hemoglobin 9.7 (L) 13.0 - 17.0 g/dL   HCT 04/17/20 (L) 02/25/20 - 67.1 %   MCV 94.6 80.0 - 100.0 fL   MCH 30.9 26.0 - 34.0 pg   MCHC 32.7 30.0 - 36.0 g/dL   RDW 24.5 80.9 - 98.3 %   Platelets 292 150 - 400 K/uL   nRBC 0.0 0.0 - 0.2 %  Type and screen [facility]     Status: None   Collection Time: 02/23/20 11:07 AM  Result Value Ref Range   ABO/RH(D) A POS    Antibody Screen NEG    Sample Expiration      02/26/2020,2359 Performed at [facility], [address], [place], [address]  [place]   ABO/Rh     Status: None   Collection Time: 02/23/20 11:07 AM  Result Value Ref Range   ABO/RH(D)      A POS Performed at [facility], [address] [place], [address] [place]   Glucose, capillary     Status: Abnormal   Collection Time: 02/23/20 11:45 AM  Result Value Ref Range   Glucose-Capillary 157 (H) 70 - 99 mg/dL  I-STAT, chem 8     Status: Abnormal   Collection Time: 02/23/20  3:02 PM  Result Value Ref Range   Sodium 141 135 - 145 mmol/L   Potassium 4.0 3.5 - 5.1 mmol/L   Chloride 104 98 - 111 mmol/L   BUN 21 8 - 23 mg/dL   Creatinine, Ser 02/25/20 0.61 - 1.24 mg/dL  Glucose, Bld 188 (H) 70 - 99 mg/dL   Calcium, Ion 4.09 1.15 - 1.40 mmol/L   TCO2 29 22 - 32 mmol/L   Hemoglobin 9.2 (L) 13.0 - 17.0 g/dL   HCT 81.1 (L) 91.4 - 78.2 %  Glucose, capillary     Status: Abnormal   Collection Time: 02/23/20  4:37 PM  Result Value Ref Range   Glucose-Capillary 218 (H) 70 - 99 mg/dL  Rapid HIV screen (HIV 1/2 Ab+Ag)     Status: None   Collection Time: 02/23/20  4:55 PM  Result Value Ref Range   HIV-1 P24 Antigen - HIV24 NON REACTIVE NON REACTIVE   HIV 1/2 Antibodies NON REACTIVE NON REACTIVE   Interpretation (HIV Ag Ab)      A non reactive test result means that HIV 1 or HIV 2 antibodies and HIV 1 p24 antigen were not detected in the specimen.  Hepatitis B surface antigen     Status: None   Collection Time: 02/23/20  4:55 PM  Result Value Ref Range   Hepatitis B Surface Ag NON REACTIVE NON REACTIVE  Glucose, capillary     Status: Abnormal   Collection Time: 02/23/20  5:39 PM  Result Value Ref Range   Glucose-Capillary 242 (H) 70 - 99 mg/dL  Glucose, capillary     Status: Abnormal   Collection Time: 02/23/20  9:00 PM  Result Value Ref Range   Glucose-Capillary 333 (H) 70 - 99 mg/dL  Basic metabolic panel     Status: Abnormal   Collection Time: 02/24/20  2:00 AM  Result Value Ref Range   Sodium 139 135 - 145 mmol/L   Potassium 4.2 3.5 - 5.1 mmol/L    Chloride 104 98 - 111 mmol/L   CO2 25 22 - 32 mmol/L   Glucose, Bld 241 (H) 70 - 99 mg/dL   BUN 21 8 - 23 mg/dL   Creatinine, Ser 9.56 0.61 - 1.24 mg/dL   Calcium 8.6 (L) 8.9 - 10.3 mg/dL   GFR calc non Af Amer >60 >60 mL/min   GFR calc Af Amer >60 >60 mL/min   Anion gap 10 5 - 15  CBC     Status: Abnormal   Collection Time: 02/24/20  2:00 AM  Result Value Ref Range   WBC 10.6 (H) 4.0 - 10.5 K/uL   RBC 2.71 (L) 4.22 - 5.81 MIL/uL   Hemoglobin 8.4 (L) 13.0 - 17.0 g/dL   HCT 21.3 (L) 08.6 - 57.8 %   MCV 94.8 80.0 - 100.0 fL   MCH 31.0 26.0 - 34.0 pg   MCHC 32.7 30.0 - 36.0 g/dL   RDW 46.9 62.9 - 52.8 %   Platelets 283 150 - 400 K/uL   nRBC 0.0 0.0 - 0.2 %  Glucose, capillary     Status: Abnormal   Collection Time: 02/24/20  7:11 AM  Result Value Ref Range   Glucose-Capillary 160 (H) 70 - 99 mg/dL     PHYSICAL EXAM:   Gen: sitting up in chair, NAD, appears well, pleasant  Lungs: unlabored, no increased WOB Cardiac: RRR Ext:       Right Lower Extremity    Dressings c/d/i  Ext warm   Motor and sensory functions intact  No DCT   Compartments are soft        Left Lower Extremity   Dressing c/d/i  prevena incisional dressing functioning well  No DCT  Compartments are soft  Distal motor and sensory functions are intact  +  DP pulse    Assessment/Plan: 1 Day Post-Op   Principal Problem:   Left tibial fracture Active Problems:   Leukocytosis   Essential hypertension   Diabetes mellitus type 2, controlled (HCC)   Fall from ladder   Anti-infectives (From admission, onward)   Start     Dose/Rate Route Frequency Ordered Stop   02/23/20 1730  ceFAZolin (ANCEF) IVPB 1 g/50 mL premix     1 g 100 mL/hr over 30 Minutes Intravenous Every 6 hours 02/23/20 1717 02/24/20 0446   02/23/20 1526  vancomycin (VANCOCIN) powder  Status:  Discontinued       As needed 02/23/20 1526 02/23/20 1607   02/23/20 1245  ceFAZolin (ANCEF) IVPB 2g/100 mL premix  Status:  Discontinued     2  g 200 mL/hr over 30 Minutes Intravenous  Once 02/23/20 1242 02/23/20 1242   02/23/20 1245  ceFAZolin (ANCEF) 3 g in dextrose 5 % 50 mL IVPB  Status:  Discontinued     3 g 100 mL/hr over 30 Minutes Intravenous  Once 02/23/20 1242 02/23/20 1717   02/17/20 0600  ceFAZolin (ANCEF) 3 g in dextrose 5 % 50 mL IVPB  Status:  Discontinued     3 g 100 mL/hr over 30 Minutes Intravenous On call to O.R. 02/16/20 1757 02/16/20 1948   02/17/20 0400  ceFAZolin (ANCEF) IVPB 2g/100 mL premix     2 g 200 mL/hr over 30 Minutes Intravenous Every 8 hours 02/17/20 0025 02/17/20 2100   02/16/20 2000  ceFAZolin (ANCEF) 3 g in dextrose 5 % 50 mL IVPB     3 g 100 mL/hr over 30 Minutes Intravenous On call to O.R. 02/16/20 1948 02/16/20 2120    .  POD/HD#: 1  64 y/o male s/p fall off ladder >15 ft    -fall off ladder   - L bicondylar tibial plateau fracture s/p removal of ex fix and ORIF              NWB L leg   Dressing change in 10 days (pt has prevena on)             Aggressive Ice and elevation              Heel cord stretching and unrestricted ankle ROM    Involvement of tibial tuberosity which required ORIF   No knee ROM x 2 weeks   Will have pt fitted for hinged brace which is to be locked out    After 2 weeks will initiate gentle ROM but no active extension x 6 weeks             resume therapy               awaiting CIR recs    - traumatic wound R leg s/p I&D and closure             prevena removed in OR   Wounds looked great   Daily care as needed   - R PTX s/p chest tube              TS has signed off  No recurrence of PTX on post op xray   Pt without symptoms    - Pain management:             Continue with current regimen    - ABL anemia/Hemodynamics             Stable  Monitor  Cbc in am               - Medical issues              Per medical team                           AKI                          appears improved                         Renal function looks  to be at baseline                        HTN                        ACE inhibitor restarted                DM                         Blood sugars have been looking good    Continue with SSI                             - DVT/PE prophylaxis:             lovenox  - ID:              abx completed    - Metabolic Bone Disease:             Vitamin d levels look good   - Activity:             WBAT R leg             NWB L leg   - FEN/GI prophylaxis/Foley/Lines:             Carb mod diet             IVF              - Impediments to fracture healing:             Severe soft tissue injury              High energy injury              DM   - Dispo:            ? CIR tomorrow   No further surgeries planned     Jari Pigg, PA-C [phone] (C) 02/24/2020, 9:56 AM  [facility] [place] [phone] (F)

## 2020-02-24 NOTE — Progress Notes (Signed)
PROGRESS NOTE    Cameron PaliJames Heinke  ZOX:[id number] DOB: 08/20/1956 DOA: 02/16/2020 PCP: Laurel DimmerKruppenbach, Katherine H, FNP    Brief Narrative:  Cameron Velasquez is a 64 year old Caucasian male with past medical history remarkable for type 2 diabetes mellitus, essential hypertension, hyperlipidemia, GERD who was admitted to the trauma service following fall off of a ladder, roughly 15 feet high without loss of consciousness suffering multiple fractures and right-sided pneumothorax.  Trauma service requested Hospitalist assistance with medical comanagement.   Assessment & Plan:   Principal Problem:   Left tibial fracture Active Problems:   Leukocytosis   Essential hypertension   Diabetes mellitus type 2, controlled (HCC)   Fall from ladder   Acute renal failure: Resolved Creatinine peaked at 1.40 on 02/17/2020.  Suspect dehydration/prerenal azotemia.  Resolved with IV fluid hydration.  ACE inhibitor now restarted. --Cr 1.07-->0.90-->1.40-->1.02-->0.84-->0.91-->0.91-->0.93  Type 2 diabetes mellitus Hemoglobin A1c 5.6, well controlled.  At home on Trulicity 1.5 mg White q Sunday, glimepiride 4 mg p.o. daily, Metformin 1000 mg twice daily. --Holding oral hypoglycemics and Trulicity while inpatient --Required 6 units for total insulin coverage past 24 hours --Insulin sliding scale for coverage while inpatient; resume home regimen on discharge --CBGs 4 times daily qAC/HS  Essential hypertension --restarted lisinopril 20mg  PO daily 4/15 --Hydralazine 25 mg p.o. every 8 hours prn for SBP >170 or DBP >110 --Continue aspirin and statin  HLD: Continue Crestor 10 mg p.o. daily  Right seventh rib fracture with tension pneumothorax Underwent right pigtail thoracostomy by Dr. Dwain SarnaWakefield on 02/17/2020.  Repeat chest x-ray this morning with right-sided chest tube in place without pneumothorax.  Chest tube placed to waterseal on 02/19/2020, repeat x-ray 4/16 with chest tube in place with no pneumothorax.   Chest tube was removed 4/16 with repeat chest x-ray without any residual pneumothorax.  Trauma surgery now signed off with outpatient follow-up.  Patient breathing well without hypoxia, and no shortness of breath. --continue Incentive spirometry  Left bicondylar tibial plateau fracture Underwent external fixation 4/12, with removal of external fixation and ORIF left bicondylar tibial plateau fracture on 02/23/2020.  Nonweightbearing left leg, no knee range of motion x2 weeks.  Continue hinged brace.  Pending likely CIR admission.  Traumatic wounds right/left legs Underwent I&D with closure by orthopedics.  Further management per Ortho. --Pain control with Tylenol, tramadol, morphine, Robaxin   DVT prophylaxis: Lovenox Code Status: Full code Family Communication: Updated patient extensively at bedside Disposition Plan: Per primary, orthopedics; likely CIR versus SNF       Procedures:   Right pigtail tube thoracostomy - Dr. Dwain SarnaWakefield 02/17/2020, dc'd 02/20/2020  Irrigation debridement open fracture with external fixation 4/12 - Dr. Carola FrostHandy  Removal of external fixation and ORIF left bicondylar tibial plateau fracture 4/19 - Dr. Carola FrostHandy  Antimicrobials:   Perioperative cefazolin 4/12 - 4/13    Subjective: Patient seen and examined at bedside, working with physical therapy this morning.  No complaints. Denies headache, no fever/chills/night sweats, no nausea/vomiting/diarrhea, no chest pain, no shortness of breath, no abdominal pain, no fatigue, no paresthesias.  No acute events overnight per nursing staff.  Objective: Vitals:   02/23/20 1743 02/23/20 2100 02/24/20 0300 02/24/20 0726  BP: (!) 170/89 (!) 160/82 (!) 141/82 (!) 144/87  Pulse: 83 95 79 78  Resp: 17 16 18 19   Temp: 98.4 F (36.9 C) 98.4 F (36.9 C) 98.3 F (36.8 C) 98.1 F (36.7 C)  TempSrc: Oral Oral Oral Oral  SpO2: 90% 96% 97% 98%  Weight:  Height:        Intake/Output Summary (Last 24 hours) at 02/24/2020  1229 Last data filed at 02/24/2020 1100 Gross per 24 hour  Intake 3510.34 ml  Output 1925 ml  Net 1585.34 ml   Filed Weights   02/16/20 1029  Weight: 120.2 kg    Examination:  General exam: Appears calm and comfortable  Respiratory system: Clear to auscultation. Respiratory effort normal. Oxygenating well on room air.  Cardiovascular system: S1 & S2 heard, RRR. No JVD, murmurs, rubs, gallops or clicks. No pedal edema. Gastrointestinal system: Abdomen is nondistended, soft and nontender. No organomegaly or masses felt. Normal bowel sounds heard. Central nervous system: Alert and oriented. No focal neurological deficits. Extremities: Symmetric 5 x 5 power.  Brace to left lower extremity noted in place. Skin: No rashes, lesions or ulcers Psychiatry: Judgement and insight appear normal. Mood & affect appropriate.     Data Reviewed: I have personally reviewed following labs and imaging studies  CBC: Recent Labs  Lab 02/18/20 0249 02/19/20 0453 02/23/20 1105 02/23/20 1502 02/24/20 0200  WBC 11.4* 11.4* 10.7*  --  10.6*  HGB 9.8* 9.5* 9.7* 9.2* 8.4*  HCT 29.0* 28.6* 29.7* 27.0* 25.7*  MCV 90.9 90.5 94.6  --  94.8  PLT 153 174 292  --  283   Basic Metabolic Panel: Recent Labs  Lab 02/18/20 0249 02/18/20 0249 02/19/20 0453 02/19/20 0453 02/19/20 1933 02/20/20 0813 02/23/20 0531 02/23/20 1502 02/24/20 0200  NA 138   < > 136  --   --  137 138 141 139  K 4.1   < > 3.9   < > 3.8 3.7 3.4* 4.0 4.2  CL 105   < > 101  --   --  101 102 104 104  CO2 25  --  26  --   --  26 24  --  25  GLUCOSE 185*   < > 207*  --   --  201* 201* 188* 241*  BUN 17   < > 15  --   --  19 18 21 21   CREATININE 1.02   < > 0.84  --   --  0.91 0.91 0.90 0.93  CALCIUM 8.2*  --  8.5*  --   --  8.7* 8.9  --  8.6*  MG  --   --   --   --  1.6*  --   --   --   --   PHOS 3.7  --   --   --   --   --   --   --   --    < > = values in this interval not displayed.   GFR: Estimated Creatinine Clearance:  104.5 mL/min (by C-G formula based on SCr of 0.93 mg/dL). Liver Function Tests: Recent Labs  Lab 02/18/20 0249  ALBUMIN 3.2*   No results for input(s): LIPASE, AMYLASE in the last 168 hours. No results for input(s): AMMONIA in the last 168 hours. Coagulation Profile: No results for input(s): INR, PROTIME in the last 168 hours. Cardiac Enzymes: Recent Labs  Lab 02/18/20 0249  CKTOTAL 504*   BNP (last 3 results) No results for input(s): PROBNP in the last 8760 hours. HbA1C: No results for input(s): HGBA1C in the last 72 hours. CBG: Recent Labs  Lab 02/23/20 1637 02/23/20 1739 02/23/20 2100 02/24/20 0711 02/24/20 1153  GLUCAP 218* 242* 333* 160* 223*   Lipid Profile: No results for input(s): CHOL, HDL, LDLCALC, TRIG, CHOLHDL,  LDLDIRECT in the last 72 hours. Thyroid Function Tests: No results for input(s): TSH, T4TOTAL, FREET4, T3FREE, THYROIDAB in the last 72 hours. Anemia Panel: No results for input(s): VITAMINB12, FOLATE, FERRITIN, TIBC, IRON, RETICCTPCT in the last 72 hours. Sepsis Labs: Recent Labs  Lab 02/18/20 0249  LATICACIDVEN 1.0    Recent Results (from the past 240 hour(s))  Respiratory Panel by RT PCR (Flu A&B, Covid) - Nasopharyngeal Swab     Status: None   Collection Time: 02/16/20 10:48 AM   Specimen: Nasopharyngeal Swab  Result Value Ref Range Status   SARS Coronavirus 2 by RT PCR NEGATIVE NEGATIVE Final    Comment: (NOTE) SARS-CoV-2 target nucleic acids are NOT DETECTED. The SARS-CoV-2 RNA is generally detectable in upper respiratoy specimens during the acute phase of infection. The lowest concentration of SARS-CoV-2 viral copies this assay can detect is 131 copies/mL. A negative result does not preclude SARS-Cov-2 infection and should not be used as the sole basis for treatment or other patient management decisions. A negative result may occur with  improper specimen collection/handling, submission of specimen other than nasopharyngeal swab,  presence of viral mutation(s) within the areas targeted by this assay, and inadequate number of viral copies (<131 copies/mL). A negative result must be combined with clinical observations, patient history, and epidemiological information. The expected result is Negative. Fact Sheet for Patients:  https://www.moore.com/ Fact Sheet for Healthcare Providers:  https://www.young.biz/ This test is not yet ap proved or cleared by the [place] FDA and  has been authorized for detection and/or diagnosis of SARS-CoV-2 by FDA under an Emergency Use Authorization (EUA). This EUA will remain  in effect (meaning this test can be used) for the duration of the COVID-19 declaration under Section 564(b)(1) of the Act, 21 U.S.C. section 360bbb-3(b)(1), unless the authorization is terminated or revoked sooner.    Influenza A by PCR NEGATIVE NEGATIVE Final   Influenza B by PCR NEGATIVE NEGATIVE Final    Comment: (NOTE) The Xpert Xpress SARS-CoV-2/FLU/RSV assay is intended as an aid in  the diagnosis of influenza from Nasopharyngeal swab specimens and  should not be used as a sole basis for treatment. Nasal washings and  aspirates are unacceptable for Xpert Xpress SARS-CoV-2/FLU/RSV  testing. Fact Sheet for Patients: https://www.moore.com/ Fact Sheet for Healthcare Providers: https://www.young.biz/ This test is not yet approved or cleared by the [place] FDA and  has been authorized for detection and/or diagnosis of SARS-CoV-2 by  FDA under an Emergency Use Authorization (EUA). This EUA will remain  in effect (meaning this test can be used) for the duration of the  Covid-19 declaration under Section 564(b)(1) of the Act, 21  U.S.C. section 360bbb-3(b)(1), unless the authorization is  terminated or revoked. Performed at [facility], [address], [place], [place] [address]   Surgical pcr screen     Status:  None   Collection Time: 02/16/20  3:30 PM   Specimen: Nasal Mucosa; Nasal Swab  Result Value Ref Range Status   MRSA, PCR NEGATIVE NEGATIVE Final   Staphylococcus aureus NEGATIVE NEGATIVE Final    Comment: (NOTE) The Xpert SA Assay (FDA approved for NASAL specimens in patients 58 years of age and older), is one component of a comprehensive surveillance program. It is not intended to diagnose infection nor to guide or monitor treatment. Performed at [facility], [address], [place], [place] [address]          Radiology Studies: DG Chest 1 View  Result  Date: 02/23/2020 CLINICAL DATA:  Postoperative, pneumothorax EXAM: CHEST  1 VIEW COMPARISON:  02/20/2020 FINDINGS: Cardiomegaly. Both lungs are clear. The visualized skeletal structures are unremarkable. IMPRESSION: No acute abnormality of the lungs in AP portable projection. No significant pneumothorax. Electronically Signed   By: Lauralyn Primes M.D.   On: 02/23/2020 17:41   DG Tibia/Fibula Left  Result Date: 02/23/2020 CLINICAL DATA:  ORIF left tibial plateau fracture. EXAM: LEFT TIBIA AND FIBULA - 2 VIEW; DG C-ARM 1-60 MIN COMPARISON:  CT left knee dated February 16, 2019. FLUOROSCOPY TIME:  1 minute, 6 seconds. C-arm fluoroscopic images were obtained intraoperatively and submitted for post operative interpretation. FINDINGS: Multiple intraoperative fluoroscopic images demonstrate interval lateral plate and screw fixation of the comminuted tibial plateau and metadiaphyseal fracture. Alignment is near anatomic. No evidence of hardware failure or loosening. Unchanged left fibular head fracture. IMPRESSION: Intraoperative fluoroscopic guidance for left tibial plateau fracture ORIF. Electronically Signed   By: Obie Dredge M.D.   On: 02/23/2020 17:34   DG Knee Left Port  Result Date: 02/23/2020 CLINICAL DATA:  Postoperative, ORIF EXAM: PORTABLE LEFT KNEE - 1-2 VIEW COMPARISON:  02/23/2020 FINDINGS: Plate and screw fixation of  fractures of the proximal left tibial metadiaphysis, fracture fragments now in near anatomic alignment. Expected overlying postoperative changes. Arthrosis of the included knee. IMPRESSION: Plate and screw fixation of fractures of the proximal left tibial metadiaphysis, fracture fragments now in near anatomic alignment. Electronically Signed   By: Lauralyn Primes M.D.   On: 02/23/2020 17:42   DG C-Arm 1-60 Min  Result Date: 02/23/2020 CLINICAL DATA:  ORIF left tibial plateau fracture. EXAM: LEFT TIBIA AND FIBULA - 2 VIEW; DG C-ARM 1-60 MIN COMPARISON:  CT left knee dated February 16, 2019. FLUOROSCOPY TIME:  1 minute, 6 seconds. C-arm fluoroscopic images were obtained intraoperatively and submitted for post operative interpretation. FINDINGS: Multiple intraoperative fluoroscopic images demonstrate interval lateral plate and screw fixation of the comminuted tibial plateau and metadiaphyseal fracture. Alignment is near anatomic. No evidence of hardware failure or loosening. Unchanged left fibular head fracture. IMPRESSION: Intraoperative fluoroscopic guidance for left tibial plateau fracture ORIF. Electronically Signed   By: Obie Dredge M.D.   On: 02/23/2020 17:34        Scheduled Meds: . acetaminophen  500 mg Oral Q12H  . vitamin C  500 mg Oral Daily  . aspirin  81 mg Oral Daily  . cholecalciferol  2,000 Units Oral BID  . docusate sodium  100 mg Oral BID  . enoxaparin (LOVENOX) injection  40 mg Subcutaneous Q24H  . gabapentin  300 mg Oral QHS  . insulin aspart  0-9 Units Subcutaneous TID WC  . lisinopril  20 mg Oral Daily  . multivitamin with minerals  1 tablet Oral Daily  . pantoprazole  20 mg Oral Daily  . polyethylene glycol  17 g Oral Daily  . rosuvastatin  10 mg Oral QHS   Continuous Infusions: . 0.9 % NaCl with KCl 20 mEq / L Stopped (02/24/20 0416)  . lactated ringers 10 mL/hr at 02/23/20 1644  . methocarbamol (ROBAXIN) IV       LOS: 8 days    Time spent: 32 minutes spent on  chart review, discussion with nursing staff, consultants, updating family and interview/physical exam; more than 50% of that time was spent in counseling and/or coordination of care.    Alvira Philips [place], DO [facility] Available via Epic secure chat 7am-7pm After these hours, please refer to coverage provider listed  on [email or web address] 02/24/2020, 12:29 PM

## 2020-02-24 NOTE — Progress Notes (Signed)
Physical Therapy Treatment Patient Details Name: Cameron Velasquez MRN: [id number] DOB: [birth year] Today's Date: 02/24/2020    History of Present Illness Cameron Velasquez is an 64 y.o. male admitted 02/16/20 after a fall from ladder, 51ft, resulting in left tibia fx, right 7th rib fx, right LE laceration. S/p I&D, closed reduction, external fixation of LLE. s/p I&D of RLE laceration. Pt with R pneumothorax and is s/p chest tube insertion. S/p LLE ex fix removal with ORIF 4/19. PMH includes DM, HTN.   PT Comments    Pt now s/p L tibial ORIF. Remains highly motivated to participate and regain PLOF. Today's session focused on transfer training, initiating hopping on RLE and LE therex. Pt able to minimally hop on RLE with RW and assist to maintain balance. Remains limited by weakness, pain and decreased activity tolerance. SOB with minimal activity and c/o rib pain; SpO2 98% on RA. Frequent cues for pursed lip breathing. Continue to recommend intensive CIR-level therapies to maximize functional mobility and independence prior to return home.    Follow Up Recommendations  CIR;Supervision for mobility/OOB     Equipment Recommendations  Rolling walker with 5" wheels    Recommendations for Other Services       Precautions / Restrictions Precautions Precautions: Fall Restrictions Weight Bearing Restrictions: Yes RLE Weight Bearing: Weight bearing as tolerated LLE Weight Bearing: Non weight bearing Other Position/Activity Restrictions: No ROM restrictions RLE    Mobility  Bed Mobility Overal bed mobility: Needs Assistance Bed Mobility: Supine to Sit     Supine to sit: Mod assist     General bed mobility comments: ModA for LLE management, pt demonstrates good core strength and using RLE to assist scooting hips  Transfers Overall transfer level: Needs assistance Equipment used: Rolling walker (2 wheeled) Transfers: Sit to/from Stand Sit to Stand: Min assist         General  transfer comment: MinA for trunk elevation and stability upon standing with RW, heavy reliance on BUE support  Ambulation/Gait Ambulation/Gait assistance: Min assist Gait Distance (Feet): 4 Feet Assistive device: Rolling walker (2 wheeled)   Gait velocity: Decreased   General Gait Details: Able to take small hops on RLE with RW while maintaining LLE NWB precautions; frequent assist to maintain balance. Pt limited by fatigue and LLE pain, DOE 3/4 requiring cues for pursed lip breathing   Stairs             Wheelchair Mobility    Modified Rankin (Stroke Patients Only)       Balance Overall balance assessment: Needs assistance Sitting-balance support: Feet supported Sitting balance-Leahy Scale: Fair       Standing balance-Leahy Scale: Poor Standing balance comment: Reliant on UE and external support                             Cognition Arousal/Alertness: Awake/alert Behavior During Therapy: WFL for tasks assessed/performed Overall Cognitive Status: Within Functional Limits for tasks assessed                                        Exercises Other Exercises Other Exercises: Heel cord stretch with pt using sheet to assist; able to tolerate static standing with LLE in dependent position for ~15 sec intervals; tolerated some L knee flex seated at edge of recliner; unable to perform LAQ, minimal quad activation    General  Comments General comments (skin integrity, edema, etc.): SpO2 98% on RA, HR 108      Pertinent Vitals/Pain Pain Assessment: Faces Faces Pain Scale: Hurts even more Pain Location: LLE Pain Descriptors / Indicators: Grimacing;Guarding;Sore Pain Intervention(s): Monitored during session;Limited activity within patient's tolerance;Ice applied;Premedicated before session    Home Living                      Prior Function            PT Goals (current goals can now be found in the care plan section) Acute Rehab  PT Goals Patient Stated Goal: Rehab at [facility] before return home Progress towards PT goals: Progressing toward goals    Frequency    Min 5X/week      PT Plan Current plan remains appropriate    Co-evaluation              AM-PAC PT "6 Clicks" Mobility   Outcome Measure  Help needed turning from your back to your side while in a flat bed without using bedrails?: A Little Help needed moving from lying on your back to sitting on the side of a flat bed without using bedrails?: A Lot Help needed moving to and from a bed to a chair (including a wheelchair)?: A Little Help needed standing up from a chair using your arms (e.g., wheelchair or bedside chair)?: A Little Help needed to walk in hospital room?: A Lot Help needed climbing 3-5 steps with a railing? : A Lot 6 Click Score: 15    End of Session Equipment Utilized During Treatment: Gait belt Activity Tolerance: Patient tolerated treatment well Patient left: in chair;with call bell/phone within reach;with chair alarm set Nurse Communication: Mobility status PT Visit Diagnosis: Unsteadiness on feet (R26.81);History of falling (Z91.81);Difficulty in walking, not elsewhere classified (R26.2)     Time: 0830-0900 PT Time Calculation (min) (ACUTE ONLY): 30 min  Charges:  $Therapeutic Exercise: 8-22 mins $Therapeutic Activity: 8-22 mins                    Mabeline Caras, PT, DPT Acute Rehabilitation Services  Pager [phone] Office [phone]  Derry Lory 02/24/2020, 9:17 AM

## 2020-02-24 NOTE — Plan of Care (Signed)
  Problem: Health Behavior/Discharge Planning: Goal: Ability to manage health-related needs will improve Outcome: Progressing   Problem: Activity: Goal: Risk for activity intolerance will decrease Outcome: Progressing   Problem: Pain Managment: Goal: General experience of comfort will improve Outcome: Progressing   

## 2020-02-24 NOTE — Progress Notes (Signed)
Inpatient Rehabilitation-Admissions Coordinator   Spoke with pt today at the bedside. He is still interested in CIR program. Will continue to follow for medical readiness and bed availability.   Cheri Rous, OTR/L  Rehab Admissions Coordinator  [phone] 02/24/2020 11:32 AM

## 2020-02-24 NOTE — Progress Notes (Signed)
Inpatient Diabetes Program Recommendations  AACE/ADA: New Consensus Statement on Inpatient Glycemic Control   Target Ranges:  Prepandial:   less than 140 mg/dL      Peak postprandial:   less than 180 mg/dL (1-2 hours)      Critically ill patients:  140 - 180 mg/dL  Results for Cameron Velasquez, Cameron Velasquez (MRN [id number]) as of 02/24/2020 08:55  Ref. Range 02/23/2020 07:50 02/23/2020 11:45 02/23/2020 16:37 02/23/2020 17:39 02/23/2020 21:00 02/24/2020 07:11  Glucose-Capillary Latest Ref Range: 70 - 99 mg/dL 754 (H) 237 (H) 023 (H) 242 (H) 333 (H) 160 (H)   Results for Cameron Velasquez, Cameron Velasquez (MRN [id number]) as of 02/24/2020 08:55  Ref. Range 02/16/2020 14:03  Hemoglobin A1C Latest Ref Range: 4.8 - 5.6 % 5.6   Review of Glycemic Control  Diabetes history: DM2 Outpatient Diabetes medications: Trulicity 1.5 mg Qweek (Sunday), Amaryl 4 mg daily, Metformin XR 1000 mg QHS Current orders for Inpatient glycemic control: Novolog 0-9 units TID with meals  Inpatient Diabetes Program Recommendations:    Insulin-Please consider ordering Novolog 0-5 units QHS for bedtime correction and Novolog 3 units TID with meals for meal coverage if patient eats at least 50% of meals.  NOTE: Noted patient received Decadron 5 mg x1 on 02/23/20 which contributing to hyperglycemia.  Thanks, Orlando Penner, RN, MSN, CDE Diabetes Coordinator Inpatient Diabetes Program [phone] (Team Pager from 8am to 5pm)

## 2020-02-25 LAB — BASIC METABOLIC PANEL
Anion gap: 11 (ref 5–15)
BUN: 18 mg/dL (ref 8–23)
CO2: 24 mmol/L (ref 22–32)
Calcium: 8.5 mg/dL — ABNORMAL LOW (ref 8.9–10.3)
Chloride: 105 mmol/L (ref 98–111)
Creatinine, Ser: 0.79 mg/dL (ref 0.61–1.24)
GFR calc Af Amer: >60 mL/min (ref >60)
GFR calc non Af Amer: >60 mL/min (ref >60)
Glucose, Bld: 186 mg/dL — ABNORMAL HIGH (ref 70–99)
Potassium: 3.7 mmol/L (ref 3.5–5.1)
Sodium: 140 mmol/L (ref 135–145)

## 2020-02-25 LAB — CBC
HCT: 26.6 % — ABNORMAL LOW (ref 39.0–52.0)
Hemoglobin: 8.7 g/dL — ABNORMAL LOW (ref 13.0–17.0)
MCH: 31.3 pg (ref 26.0–34.0)
MCHC: 32.7 g/dL (ref 30.0–36.0)
MCV: 95.7 fL (ref 80.0–100.0)
Platelets: 308 10*3/uL (ref 150–400)
RBC: 2.78 MIL/uL — ABNORMAL LOW (ref 4.22–5.81)
RDW: 15 % (ref 11.5–15.5)
WBC: 10.8 10*3/uL — ABNORMAL HIGH (ref 4.0–10.5)
nRBC: 0 % (ref 0.0–0.2)

## 2020-02-25 LAB — GLUCOSE, CAPILLARY
Glucose-Capillary: 171 mg/dL — ABNORMAL HIGH (ref 70–99)
Glucose-Capillary: 233 mg/dL — ABNORMAL HIGH (ref 70–99)
Glucose-Capillary: 232 mg/dL — ABNORMAL HIGH (ref 70–99)
Glucose-Capillary: 210 mg/dL — ABNORMAL HIGH (ref 70–99)

## 2020-02-25 NOTE — Progress Notes (Signed)
Inpatient Diabetes Program Recommendations  AACE/ADA: New Consensus Statement on Inpatient Glycemic Control   Target Ranges:  Prepandial:   less than 140 mg/dL      Peak postprandial:   less than 180 mg/dL (1-2 hours)      Critically ill patients:  140 - 180 mg/dL   Results for Cameron Velasquez, Cameron Velasquez (MRN [id number]) as of 02/25/2020 11:48  Ref. Range 02/24/2020 07:11 02/24/2020 11:53 02/24/2020 16:10 02/24/2020 20:17 02/25/2020 07:30 02/25/2020 11:38  Glucose-Capillary Latest Ref Range: 70 - 99 mg/dL 761 (H) 915 (H) 502 (H) 198 (H) 171 (H) 233 (H)   Review of Glycemic Control  Diabetes history: DM2 Outpatient Diabetes medications: Trulicity 1.5 mg Qweek (Sunday), Amaryl 4 mg daily, Metformin XR 1000 mg QHS Current orders for Inpatient glycemic control: Novolog 0-9 units TID with meals, Novolog 0-5 units QHS  Inpatient Diabetes Program Recommendations:    Insulin-Please consider ordering Novolog 3 units TID with meals for meal coverage if patient eats at least 50% of meals.  NOTE: Noted patient received Decadron 5 mg x1 on 02/23/20.  Thanks, Orlando Penner, RN, MSN, CDE Diabetes Coordinator Inpatient Diabetes Program [phone] (Team Pager from 8am to 5pm)

## 2020-02-25 NOTE — Progress Notes (Signed)
Physical Therapy Treatment Patient Details Name: Cameron Velasquez MRN: [id number] DOB: 1956/07/01 Today's Date: 02/25/2020    History of Present Illness Cameron Velasquez is an 64 y.o. male admitted 02/16/20 after a fall from ladder, 73ft, resulting in left tibia fx, right 7th rib fx, right LE laceration. S/p I&D, closed reduction, external fixation of LLE. s/p I&D of RLE laceration. Pt with R pneumothorax and is s/p chest tube insertion. S/p LLE ex fix removal with ORIF 4/19. PMH includes DM, HTN.   PT Comments    Pt progressing with mobility. Today's session focused on gait training with RW and LE therex. Pt requires assist for lower body ADLs and standing balance. Limited by generalized weakness, LLE and R-side rib pain, decreased activity tolerance, and poor balance reactions/postural strategies. Highly motivated to participate with good family support. Continue to recommend intensive CIR-level therapies to maximize functional mobility and independence prior to return home.    Follow Up Recommendations  CIR;Supervision for mobility/OOB     Equipment Recommendations  Rolling walker with 5" wheels    Recommendations for Other Services       Precautions / Restrictions Precautions Precautions: Fall;Other (comment) Precaution Comments: LLE wound vac Required Braces or Orthoses: Other Brace Other Brace: Bledsoe brace locked in extension Restrictions Weight Bearing Restrictions: Yes RLE Weight Bearing: Weight bearing as tolerated LLE Weight Bearing: Non weight bearing Other Position/Activity Restrictions: No ROM L knee; unrestricted ROM RLE    Mobility  Bed Mobility               General bed mobility comments: Received sitting in recliner  Transfers Overall transfer level: Needs assistance Equipment used: Rolling walker (2 wheeled) Transfers: Sit to/from Stand Sit to Stand: Min guard;Mod assist         General transfer comment: Reliant on momentum to power into  standing; modA for LLE management in order to maintain LLE NWB precautions when going to sit as pt unable to lift leg  Ambulation/Gait Ambulation/Gait assistance: Min guard;Min assist Gait Distance (Feet): 30 Feet Assistive device: Rolling walker (2 wheeled)   Gait velocity: Decreased   General Gait Details: Improving ability to hop on RLE with RW in order to maintain LLE NWB precautions; pt required 4x standing rest break secondary to SOB and fatigue, limited by R-side rib pain and LLE pain; intermittent minA to prevent LOB; cues for pursed lip breathing   Stairs             Wheelchair Mobility    Modified Rankin (Stroke Patients Only)       Balance Overall balance assessment: Needs assistance Sitting-balance support: Feet supported Sitting balance-Leahy Scale: Fair       Standing balance-Leahy Scale: Poor Standing balance comment: Reliant on UE and external support                             Cognition Arousal/Alertness: Awake/alert Behavior During Therapy: WFL for tasks assessed/performed Overall Cognitive Status: Within Functional Limits for tasks assessed                                        Exercises Other Exercises Other Exercises: L heel cord stretch with sheet assist (3x 60-sec holds); quad sets for 5-sec holds    General Comments General comments (skin integrity, edema, etc.): SpO2 100% on RA, HR 81. Bledsoe brace upper straps  removed to reposition wound vac line to prevent skin breakdown      Pertinent Vitals/Pain Pain Assessment: Faces Faces Pain Scale: Hurts even more Pain Location: LLE, R-side ribs Pain Descriptors / Indicators: Grimacing;Guarding;Sore Pain Intervention(s): Monitored during session;Limited activity within patient's tolerance;Premedicated before session    Home Living                      Prior Function            PT Goals (current goals can now be found in the care plan section)  Acute Rehab PT Goals Patient Stated Goal: Rehab at [facility] before return home Progress towards PT goals: Progressing toward goals    Frequency    Min 5X/week      PT Plan Current plan remains appropriate    Co-evaluation              AM-PAC PT "6 Clicks" Mobility   Outcome Measure  Help needed turning from your back to your side while in a flat bed without using bedrails?: A Little Help needed moving from lying on your back to sitting on the side of a flat bed without using bedrails?: A Lot Help needed moving to and from a bed to a chair (including a wheelchair)?: A Little Help needed standing up from a chair using your arms (e.g., wheelchair or bedside chair)?: A Little Help needed to walk in hospital room?: A Little Help needed climbing 3-5 steps with a railing? : A Lot 6 Click Score: 16    End of Session Equipment Utilized During Treatment: Gait belt Activity Tolerance: Patient tolerated treatment well Patient left: in chair;with call bell/phone within reach Nurse Communication: Mobility status PT Visit Diagnosis: Unsteadiness on feet (R26.81);History of falling (Z91.81);Difficulty in walking, not elsewhere classified (R26.2)     Time: 6468-0321 PT Time Calculation (min) (ACUTE ONLY): 28 min  Charges:  $Gait Training: 8-22 mins $Therapeutic Exercise: 8-22 mins                    Mabeline Caras, PT, DPT Acute Rehabilitation Services  Pager [phone] Office [place] 02/25/2020, 4:05 PM

## 2020-02-25 NOTE — Progress Notes (Signed)
Inpatient Rehabilitation-Admissions Coordinator   Met with pt bedside. Discussed that we do not have a bed open for him today. Will follow up tomorrow.   Raechel Ache, OTR/L  Rehab Admissions Coordinator  [phone] 02/25/2020 10:41 AM

## 2020-02-25 NOTE — Progress Notes (Signed)
Orthopaedic Trauma Service Progress Note  Patient ID: Cameron Velasquez MRN: [id number] DOB/AGE: 12-Feb-1956 64 y.o.  Subjective:  No issues Doing well Ready to go to CIR   Pain controlled   ROS As above Objective:   VITALS:   Vitals:   02/24/20 1439 02/24/20 1934 02/25/20 0301 02/25/20 0733  BP: (!) 142/72 (!) 142/85 139/81 (!) 143/81  Pulse: 88 64 82 78  Resp: 18 17 18 17   Temp: 98.4 F (36.9 C) 97.8 F (36.6 C) 97.9 F (36.6 C) 98 F (36.7 C)  TempSrc: Oral Oral Oral Oral  SpO2: 97% 100% 96% 100%  Weight:      Height:        Estimated body mass index is 36.96 kg/m as calculated from the following:   Height as of this encounter: 5\' 11"  (1.803 m).   Weight as of this encounter: 120.2 kg.   Intake/Output      04/20 0701 - 04/21 0700 04/21 0701 - 04/22 0700   P.O. 840    I.V. (mL/kg) 844 (7)    IV Piggyback 0    Total Intake(mL/kg) 1684 (14)    Urine (mL/kg/hr) 920 (0.3)    Drains 0    Stool     Blood     Total Output 920    Net +764           LABS  Results for orders placed or performed during the hospital encounter of 02/16/20 (from the past 24 hour(s))  Glucose, capillary     Status: Abnormal   Collection Time: 02/24/20 11:53 AM  Result Value Ref Range   Glucose-Capillary 223 (H) 70 - 99 mg/dL  Glucose, capillary     Status: Abnormal   Collection Time: 02/24/20  4:10 PM  Result Value Ref Range   Glucose-Capillary 242 (H) 70 - 99 mg/dL  Glucose, capillary     Status: Abnormal   Collection Time: 02/24/20  8:17 PM  Result Value Ref Range   Glucose-Capillary 198 (H) 70 - 99 mg/dL  Basic metabolic panel     Status: Abnormal   Collection Time: 02/25/20  4:40 AM  Result Value Ref Range   Sodium 140 135 - 145 mmol/L   Potassium 3.7 3.5 - 5.1 mmol/L   Chloride 105 98 - 111 mmol/L   CO2 24 22 - 32 mmol/L   Glucose, Bld 186 (H) 70 - 99 mg/dL   BUN 18 8 - 23 mg/dL   Creatinine, Ser 02/26/20 0.61 - 1.24 mg/dL   Calcium 8.5 (L) 8.9 - 10.3 mg/dL   GFR calc non Af Amer >60 >60 mL/min   GFR calc Af Amer >60 >60 mL/min   Anion gap 11 5 - 15  CBC     Status: Abnormal   Collection Time: 02/25/20  7:22 AM  Result Value Ref Range   WBC 10.8 (H) 4.0 - 10.5 K/uL   RBC 2.78 (L) 4.22 - 5.81 MIL/uL   Hemoglobin 8.7 (L) 13.0 - 17.0 g/dL   HCT 8.24 (L) 02/27/20 - 23.5 %   MCV 95.7 80.0 - 100.0 fL   MCH 31.3 26.0 - 34.0 pg   MCHC 32.7 30.0 - 36.0 g/dL   RDW 36.1 44.3 - 15.4 %   Platelets 308 150 - 400 K/uL   nRBC 0.0 0.0 -  0.2 %  Glucose, capillary     Status: Abnormal   Collection Time: 02/25/20  7:30 AM  Result Value Ref Range   Glucose-Capillary 171 (H) 70 - 99 mg/dL    CBG (last 3)  Recent Labs    02/24/20 1610 02/24/20 2017 02/25/20 0730  GLUCAP 242* 198* 171*     PHYSICAL EXAM:   Gen: sitting up in bed, NAD Lungs: unlabored, no increased WOB Cardiac: RRR Ext:       Right Lower Extremity                    Dressings c/d/i  Dressings removed  All wounds look great  No drainage, no signs of infection              Ext warm              Motor and sensory functions intact             No DCT              Compartments are soft                   Left Lower Extremity              Dressing c/d/i  Hinged brace is on and is locked in full extension as ordered              prevena incisional dressing functioning well             No DCT             Compartments are soft             Distal motor and sensory functions are intact             + DP pulse   Assessment/Plan: 2 Days Post-Op   Principal Problem:   Left tibial fracture Active Problems:   Leukocytosis   Essential hypertension   Diabetes mellitus type 2, controlled (HCC)   Fall from ladder   Anti-infectives (From admission, onward)   Start     Dose/Rate Route Frequency Ordered Stop   02/23/20 1730  ceFAZolin (ANCEF) IVPB 1 g/50 mL premix     1 g 100 mL/hr over 30 Minutes Intravenous  Every 6 hours 02/23/20 1717 02/24/20 0446   02/23/20 1526  vancomycin (VANCOCIN) powder  Status:  Discontinued       As needed 02/23/20 1526 02/23/20 1607   02/23/20 1245  ceFAZolin (ANCEF) IVPB 2g/100 mL premix  Status:  Discontinued     2 g 200 mL/hr over 30 Minutes Intravenous  Once 02/23/20 1242 02/23/20 1242   02/23/20 1245  ceFAZolin (ANCEF) 3 g in dextrose 5 % 50 mL IVPB  Status:  Discontinued     3 g 100 mL/hr over 30 Minutes Intravenous  Once 02/23/20 1242 02/23/20 1717   02/17/20 0600  ceFAZolin (ANCEF) 3 g in dextrose 5 % 50 mL IVPB  Status:  Discontinued     3 g 100 mL/hr over 30 Minutes Intravenous On call to O.R. 02/16/20 1757 02/16/20 1948   02/17/20 0400  ceFAZolin (ANCEF) IVPB 2g/100 mL premix     2 g 200 mL/hr over 30 Minutes Intravenous Every 8 hours 02/17/20 0025 02/17/20 2100   02/16/20 2000  ceFAZolin (ANCEF) 3 g in dextrose 5 % 50 mL IVPB     3 g 100 mL/hr over 30 Minutes Intravenous On call to O.R.  02/16/20 1948 02/16/20 2120    .  POD/HD#: 2    64 y/o male s/p fall off ladder >15 ft    -fall off ladder   - L bicondylar tibial plateau fracture s/p removal of ex fix and ORIF              NWB L leg              Dressing change in 10 days (pt has prevena on)             Aggressive Ice and elevation              Heel cord stretching and unrestricted ankle ROM                Involvement of tibial tuberosity which required ORIF                         No knee ROM x 2 weeks                         hinged brace on at all times, locked in full extension                          After 2 weeks will initiate gentle ROM but no active extension x 6 weeks             resume therapy               stable for transfer to CIR    - traumatic wound R leg s/p I&D and closure           Dressing changed  Ok to leave open to air  Clean with soap and water only               - R PTX s/p chest tube              TS has signed off             No recurrence of PTX on post  op xray              Pt without symptoms    - Pain management:             Continue with current regimen    - ABL anemia/Hemodynamics             Stable             Monitor                          - Medical issues              Per medical team                           AKI                          appears improved                         Renal function looks to be at baseline                        HTN  ACE inhibitor restarted                DM                         Blood sugars have been looking good                          Continue with SSI                             - DVT/PE prophylaxis:             lovenox x 21 days  - ID:              abx completed    - Metabolic Bone Disease:             Vitamin d levels look good   - Activity:             WBAT R leg             NWB L leg   - FEN/GI prophylaxis/Foley/Lines:             Carb mod diet             IVF              - Impediments to fracture healing:             Severe soft tissue injury              High energy injury              DM   - Dispo:            ortho issues stable  Ortho follow up in 10 days   Stable for transfer to CIR      Mearl Latin, PA-C [phone] (C) 02/25/2020, 9:20 AM  [facility] [address] [place] [address] [phone] Collier Bullock (F)

## 2020-02-25 NOTE — TOC Initial Note (Signed)
Transition of Care [facility]) - Initial/Assessment Note    Patient Details  Name: Cameron Velasquez MRN: [id number] Date of Birth: 1955/12/04  Transition of Care [facility]) CM/SW Contact:    Cameron Feil, LCSW Phone Number: 02/25/2020, 5:02 PM  Clinical Narrative:  CSW talked with patient in his room regarding his discharge disposition and CSW's awareness that [facility] has accepted him pending bed availability. Mr. Cameron Velasquez was sitting in a chair, and was alert, oriented and willing to talk with CSW. It was explained that he is medically stable for discharge and may have to consider SNF if inpatient rehab unable to accept him, or if they don't have an available bed and a SNF bed is available. CSW also explained that his insurance could stop paying as MD as indicated in his note that he is medically stable for discharge. Ms. Cameron Velasquez expressed understanding and honestly conversed with CSW his desire to d/c to [facility] and not a skilled facility for rehab. He expressed concern regarding COVID in facilities and indicated that he is primary caregiver for his wife who has health issues. CSW advised that he and his wife have had their COVID vaccines. He added that she can barely walk and has a rollator and wheelchair. Cameron Velasquez explained that his son and daughter are checking in on her and providing her meals and their daughter that lives in [place] is coming next week to help out.   Patient reported that he has never been to a SNF for ST rehab and the process and what the SNF's can provide was explained. Patient expressed his concerns as noted above, however he was receptive to participating in the conversation and understood CSW's reason for coming to see him and the need for having a discharge plan in place. Patient also expressed understanding that if [facility] cannot take him tomorrow and a SNF bed(s) or available he will need to d/c to SNF, rather than waiting for an inpatient rehab bed. Patient expressed  understanding even though this is not what he wants to do. Mr. Cameron Velasquez was provided with a Medicare.gov SNF list to review.  Patient indicated that he will talk with his wife regarding going to SNF for rehab if needed. CSW advised patient that follow-up would be done on Thursday, after determining if [facility] can accept him Thursday.                    Expected Discharge Plan: IP Rehab Facility(Patient's preference is inpatient rehab. Agreeable to SNF as back up) Barriers to Discharge: Other (comment)(waiting for bed with CIR)   Patient Goals and CMS Choice   CMS Medicare.gov Compare Post Acute Care list provided to:: Patient Choice offered to / list presented to : Patient  Expected Discharge Plan and Services Expected Discharge Plan: IP Rehab Facility(Patient's preference is inpatient rehab. Agreeable to SNF as back up)       Living arrangements for the past 2 months: [place]                                      Prior Living Arrangements/Services Living arrangements for the past 2 months: Single Family Home Lives with:: Spouse Patient language and need for interpreter reviewed:: No Do you feel safe going back to the place where you live?: No   Patient agreeable to [facility] - Inpatient Rehab  Need for Family Participation in Patient Care: No (Comment)  Care giver support system in place?: No (comment)   Criminal Activity/Legal Involvement Pertinent to Current Situation/Hospitalization: No - Comment as needed  Activities of Daily Living Home Assistive Devices/Equipment: None ADL Screening (condition at time of admission) Patient's cognitive ability adequate to safely complete daily activities?: Yes Is the patient deaf or have difficulty hearing?: No Does the patient have difficulty seeing, even when wearing glasses/contacts?: No Does the patient have difficulty concentrating, remembering, or making decisions?: No Patient able to express need for assistance with ADLs?:  Yes Does the patient have difficulty dressing or bathing?: No Independently performs ADLs?: Yes (appropriate for developmental age) Does the patient have difficulty walking or climbing stairs?: Yes Weakness of Legs: Both Weakness of Arms/Hands: Right  Permission Sought/Granted Permission sought to share information with : Cameron Velasquez granted to share information with : Yes, Verbal Permission Granted(Permission granted to send information to SNF's)     Permission granted to share info w AGENCY: Appropriate SNF's        Emotional Assessment Appearance:: Appears stated age Attitude/Demeanor/Rapport: Engaged Affect (typically observed): Pleasant Orientation: : Oriented to Self, Oriented to Place, Oriented to  Time, Oriented to Situation Alcohol / Substance Use: Tobacco Use, Alcohol Use, Illicit Drugs(No record of tobacco, alcohol or illicit drug use on file) Psych Involvement: No (comment)  Admission diagnosis:  Trauma [T14.90XA] Pain [R52] Tibial plateau fracture, left [S82.142A] Patient Active Problem List   Diagnosis Date Noted  . Left tibial fracture 02/16/2020  . Leukocytosis 02/16/2020  . Essential hypertension 02/16/2020  . Diabetes mellitus type 2, controlled (HCC) 02/16/2020  . Fall from ladder 02/16/2020   PCP:  Cameron Dimmer, FNP Pharmacy:   [facility] [address], [place] - [address] [place] [address] Phone: [phone] Fax: [phone]     Social Determinants of Health (SDOH) Interventions  No SDOH interventions requested or needed at this time.  Readmission Risk Interventions No flowsheet data found.

## 2020-02-25 NOTE — Progress Notes (Addendum)
PROGRESS NOTE    Kallin Henk  SWF:[id number] DOB: May 16, 1956 DOA: 02/16/2020 PCP: Laurel Dimmer, FNP    Brief Narrative:  Bensen Chadderdon is a 64 year old Caucasian male with past medical history remarkable for type 2 diabetes mellitus, essential hypertension, hyperlipidemia, GERD who was admitted to the trauma service following fall off of a ladder, roughly 15 feet high without loss of consciousness suffering multiple fractures and right-sided pneumothorax. Trauma service requested Hospitalist assistance with medical comanagement.  Assessment & Plan:   Principal Problem:   Left tibial fracture Active Problems:   Leukocytosis   Essential hypertension   Diabetes mellitus type 2, controlled (HCC)   Fall from ladder   Acute kidney injury likely secondary to volume depletion.  Resolved.  ACE inhibitor has been restarted.  Latest creatinine of 0.7.  Type 2 diabetes mellitus Hemoglobin A1c 5.6, well controlled.  At home on Trulicity 1.5 mg Oliver q Sunday, glimepiride 4 mg p.o. daily, Metformin 1000 mg twice daily.  Continue sliding scale while in the hospital.  Continue diabetic diet.  Latest POC glucose of 171.  Essential hypertension On lisinopril hydralazine.  Latest blood pressure of 143/81  Hyperlipidemia: Continue statins  Right seventh rib fracture with tension pneumothorax Underwent right pigtail thoracostomy by Dr. Dwain Sarna on 02/17/2020.  Chest tube was removed 4/16 with repeat chest x-ray without any residual pneumothorax.  At this time patient is stable.  Continue incentive spirometry.  Trauma surgery to follow the patient as outpatient.  Left bicondylar tibial plateau fracture Underwent external fixation 4/12, with removal of external fixation and ORIF left bicondylar tibial plateau fracture on 02/23/2020.  Nonweightbearing left leg, no knee range of motion x2 weeks.  Continue hinged brace.  Awaiting for CIR admission.  Traumatic wounds right/left  legs Underwent I&D with closure by orthopedics.  Further management per Ortho. Continue pain control with Tylenol, tramadol, morphine, Robaxin   DVT prophylaxis: Lovenox subcu  Code Status: Full code  Family Communication: None  Disposition Plan: Per primary, orthopedics; likely CIR versus SNF.  Medically stable for disposition.       Procedures:   Right pigtail tube thoracostomy - Dr. Dwain Sarna 02/17/2020, dc'd 02/20/2020  Irrigation debridement open fracture with external fixation 4/12 - Dr. Carola Frost  Removal of external fixation and ORIF left bicondylar tibial plateau fracture 4/19 - Dr. Carola Frost  Antimicrobials:   Cefazolin preop   Subjective: Today, patient was seen and examined at bedside.  Patient denies overt pain, nausea, vomiting.  Had bowel movement yesterday.  Waiting to go to rehab.  Objective: Vitals:   02/24/20 1439 02/24/20 1934 02/25/20 0301 02/25/20 0733  BP: (!) 142/72 (!) 142/85 139/81 (!) 143/81  Pulse: 88 64 82 78  Resp: 18 17 18 17   Temp: 98.4 F (36.9 C) 97.8 F (36.6 C) 97.9 F (36.6 C) 98 F (36.7 C)  TempSrc: Oral Oral Oral Oral  SpO2: 97% 100% 96% 100%  Weight:      Height:        Intake/Output Summary (Last 24 hours) at 02/25/2020 1059 Last data filed at 02/25/2020 0745 Gross per 24 hour  Intake 1684.04 ml  Output 921 ml  Net 763.04 ml   Filed Weights   02/16/20 1029  Weight: 120.2 kg   Body mass index is 36.96 kg/m.   Examination: General: Obese built, not in obvious distress HENT: Normocephalic, pupils equally reacting to light and accommodation.  No scleral pallor or icterus noted. Oral mucosa is moist.  Chest:  Clear breath sounds.  Diminished breath sounds bilaterally. No crackles or wheezes.  CVS: S1 &S2 heard. No murmur.  Regular rate and rhythm. Abdomen: Soft, nontender, nondistended.  Bowel sounds are heard.  Liver is not palpable, no abdominal mass palpated Extremities: No cyanosis, clubbing or edema.  Left lower  extremity brace.  Right leg with incision with sutures.  Right chest wall with dressing from recent pigtail catheter.  Edema of the toes.  Distal pulses palpable Psych: Alert, awake and oriented, normal mood CNS:  No cranial nerve deficits.  Left Lower extremity brace.   Skin: Warm and dry.  No rashes noted.    Data Reviewed: I have personally reviewed following labs and imaging studies  CBC: Recent Labs  Lab 02/19/20 0453 02/23/20 1105 02/23/20 1502 02/24/20 0200 02/25/20 0722  WBC 11.4* 10.7*  --  10.6* 10.8*  HGB 9.5* 9.7* 9.2* 8.4* 8.7*  HCT 28.6* 29.7* 27.0* 25.7* 26.6*  MCV 90.5 94.6  --  94.8 95.7  PLT 174 292  --  283 308   Basic Metabolic Panel: Recent Labs  Lab 02/19/20 0453 02/19/20 0453 02/19/20 1933 02/20/20 0813 02/23/20 0531 02/23/20 1502 02/24/20 0200 02/25/20 0440  NA 136   < >  --  137 138 141 139 140  K 3.9   < > 3.8 3.7 3.4* 4.0 4.2 3.7  CL 101   < >  --  101 102 104 104 105  CO2 26  --   --  26 24  --  25 24  GLUCOSE 207*   < >  --  201* 201* 188* 241* 186*  BUN 15   < >  --  19 18 21 21 18   CREATININE 0.84   < >  --  0.91 0.91 0.90 0.93 0.79  CALCIUM 8.5*  --   --  8.7* 8.9  --  8.6* 8.5*  MG  --   --  1.6*  --   --   --   --   --    < > = values in this interval not displayed.   GFR: Estimated Creatinine Clearance: 121.5 mL/min (by C-G formula based on SCr of 0.79 mg/dL). Liver Function Tests: No results for input(s): AST, ALT, ALKPHOS, BILITOT, PROT, ALBUMIN in the last 168 hours. No results for input(s): LIPASE, AMYLASE in the last 168 hours. No results for input(s): AMMONIA in the last 168 hours. Coagulation Profile: No results for input(s): INR, PROTIME in the last 168 hours. Cardiac Enzymes: No results for input(s): CKTOTAL, CKMB, CKMBINDEX, TROPONINI in the last 168 hours. BNP (last 3 results) No results for input(s): PROBNP in the last 8760 hours. HbA1C: No results for input(s): HGBA1C in the last 72 hours. CBG: Recent Labs   Lab 02/24/20 0711 02/24/20 1153 02/24/20 1610 02/24/20 2017 02/25/20 0730  GLUCAP 160* 223* 242* 198* 171*   Lipid Profile: No results for input(s): CHOL, HDL, LDLCALC, TRIG, CHOLHDL, LDLDIRECT in the last 72 hours. Thyroid Function Tests: No results for input(s): TSH, T4TOTAL, FREET4, T3FREE, THYROIDAB in the last 72 hours. Anemia Panel: No results for input(s): VITAMINB12, FOLATE, FERRITIN, TIBC, IRON, RETICCTPCT in the last 72 hours. Sepsis Labs: No results for input(s): PROCALCITON, LATICACIDVEN in the last 168 hours.  Recent Results (from the past 240 hour(s))  Respiratory Panel by RT PCR (Flu A&B, Covid) - Nasopharyngeal Swab     Status: None   Collection Time: 02/16/20 10:48 AM   Specimen: Nasopharyngeal Swab  Result Value Ref Range Status   SARS  Coronavirus 2 by RT PCR NEGATIVE NEGATIVE Final    Comment: (NOTE) SARS-CoV-2 target nucleic acids are NOT DETECTED. The SARS-CoV-2 RNA is generally detectable in upper respiratoy specimens during the acute phase of infection. The lowest concentration of SARS-CoV-2 viral copies this assay can detect is 131 copies/mL. A negative result does not preclude SARS-Cov-2 infection and should not be used as the sole basis for treatment or other patient management decisions. A negative result may occur with  improper specimen collection/handling, submission of specimen other than nasopharyngeal swab, presence of viral mutation(s) within the areas targeted by this assay, and inadequate number of viral copies (<131 copies/mL). A negative result must be combined with clinical observations, patient history, and epidemiological information. The expected result is Negative. Fact Sheet for Patients:  https://www.moore.com/ Fact Sheet for Healthcare Providers:  https://www.young.biz/ This test is not yet ap proved or cleared by the [place] FDA and  has been authorized for detection and/or diagnosis  of SARS-CoV-2 by FDA under an Emergency Use Authorization (EUA). This EUA will remain  in effect (meaning this test can be used) for the duration of the COVID-19 declaration under Section 564(b)(1) of the Act, 21 U.S.C. section 360bbb-3(b)(1), unless the authorization is terminated or revoked sooner.    Influenza A by PCR NEGATIVE NEGATIVE Final   Influenza B by PCR NEGATIVE NEGATIVE Final    Comment: (NOTE) The Xpert Xpress SARS-CoV-2/FLU/RSV assay is intended as an aid in  the diagnosis of influenza from Nasopharyngeal swab specimens and  should not be used as a sole basis for treatment. Nasal washings and  aspirates are unacceptable for Xpert Xpress SARS-CoV-2/FLU/RSV  testing. Fact Sheet for Patients: https://www.moore.com/ Fact Sheet for Healthcare Providers: https://www.young.biz/ This test is not yet approved or cleared by the [place] FDA and  has been authorized for detection and/or diagnosis of SARS-CoV-2 by  FDA under an Emergency Use Authorization (EUA). This EUA will remain  in effect (meaning this test can be used) for the duration of the  Covid-19 declaration under Section 564(b)(1) of the Act, 21  U.S.C. section 360bbb-3(b)(1), unless the authorization is  terminated or revoked. Performed at [facility], [address], [place], [place] [address]   Surgical pcr screen     Status: None   Collection Time: 02/16/20  3:30 PM   Specimen: Nasal Mucosa; Nasal Swab  Result Value Ref Range Status   MRSA, PCR NEGATIVE NEGATIVE Final   Staphylococcus aureus NEGATIVE NEGATIVE Final    Comment: (NOTE) The Xpert SA Assay (FDA approved for NASAL specimens in patients 65 years of age and older), is one component of a comprehensive surveillance program. It is not intended to diagnose infection nor to guide or monitor treatment. Performed at [facility], [address] [place], [place] [address]        Radiology  Studies: DG Chest 1 View  Result Date: 02/23/2020 CLINICAL DATA:  Postoperative, pneumothorax EXAM: CHEST  1 VIEW COMPARISON:  02/20/2020 FINDINGS: Cardiomegaly. Both lungs are clear. The visualized skeletal structures are unremarkable. IMPRESSION: No acute abnormality of the lungs in AP portable projection. No significant pneumothorax. Electronically Signed   By: Lauralyn Primes M.D.   On: 02/23/2020 17:41   DG Tibia/Fibula Left  Result Date: 02/23/2020 CLINICAL DATA:  ORIF left tibial plateau fracture. EXAM: LEFT TIBIA AND FIBULA - 2 VIEW; DG C-ARM 1-60 MIN COMPARISON:  CT left knee dated February 16, 2019. FLUOROSCOPY TIME:  1 minute, 6 seconds. C-arm fluoroscopic  images were obtained intraoperatively and submitted for post operative interpretation. FINDINGS: Multiple intraoperative fluoroscopic images demonstrate interval lateral plate and screw fixation of the comminuted tibial plateau and metadiaphyseal fracture. Alignment is near anatomic. No evidence of hardware failure or loosening. Unchanged left fibular head fracture. IMPRESSION: Intraoperative fluoroscopic guidance for left tibial plateau fracture ORIF. Electronically Signed   By: Titus Dubin M.D.   On: 02/23/2020 17:34   DG Knee Left Port  Result Date: 02/23/2020 CLINICAL DATA:  Postoperative, ORIF EXAM: PORTABLE LEFT KNEE - 1-2 VIEW COMPARISON:  02/23/2020 FINDINGS: Plate and screw fixation of fractures of the proximal left tibial metadiaphysis, fracture fragments now in near anatomic alignment. Expected overlying postoperative changes. Arthrosis of the included knee. IMPRESSION: Plate and screw fixation of fractures of the proximal left tibial metadiaphysis, fracture fragments now in near anatomic alignment. Electronically Signed   By: Eddie Candle M.D.   On: 02/23/2020 17:42   DG C-Arm 1-60 Min  Result Date: 02/23/2020 CLINICAL DATA:  ORIF left tibial plateau fracture. EXAM: LEFT TIBIA AND FIBULA - 2 VIEW; DG C-ARM 1-60 MIN COMPARISON:   CT left knee dated February 16, 2019. FLUOROSCOPY TIME:  1 minute, 6 seconds. C-arm fluoroscopic images were obtained intraoperatively and submitted for post operative interpretation. FINDINGS: Multiple intraoperative fluoroscopic images demonstrate interval lateral plate and screw fixation of the comminuted tibial plateau and metadiaphyseal fracture. Alignment is near anatomic. No evidence of hardware failure or loosening. Unchanged left fibular head fracture. IMPRESSION: Intraoperative fluoroscopic guidance for left tibial plateau fracture ORIF. Electronically Signed   By: Titus Dubin M.D.   On: 02/23/2020 17:34        Scheduled Meds: . acetaminophen  500 mg Oral Q12H  . vitamin C  500 mg Oral Daily  . aspirin  81 mg Oral Daily  . cholecalciferol  2,000 Units Oral BID  . docusate sodium  100 mg Oral BID  . enoxaparin (LOVENOX) injection  40 mg Subcutaneous Q24H  . gabapentin  300 mg Oral QHS  . insulin aspart  0-5 Units Subcutaneous QHS  . insulin aspart  0-9 Units Subcutaneous TID WC  . lisinopril  20 mg Oral Daily  . multivitamin with minerals  1 tablet Oral Daily  . pantoprazole  20 mg Oral Daily  . polyethylene glycol  17 g Oral Daily  . rosuvastatin  10 mg Oral QHS   Continuous Infusions: . lactated ringers 10 mL/hr at 02/23/20 1644  . methocarbamol (ROBAXIN) IV       LOS: 9 days    Flora Lipps, MD [facility]  02/25/2020, 10:59 AM

## 2020-02-26 ENCOUNTER — Inpatient Hospital Stay (HOSPITAL_COMMUNITY)
Admission: RE | Admit: 2020-02-26 | Discharge: 2020-03-05 | DRG: 560 | Disposition: A | Discharge status: Home Health | Payer: Medicare Other | Source: Intra-hospital | Attending: Physical Medicine & Rehabilitation | Admitting: Physical Medicine & Rehabilitation

## 2020-02-26 ENCOUNTER — Encounter (HOSPITAL_COMMUNITY): Payer: Self-pay | Admitting: Physical Medicine & Rehabilitation

## 2020-02-26 ENCOUNTER — Other Ambulatory Visit: Payer: Self-pay

## 2020-02-26 ENCOUNTER — Encounter (HOSPITAL_COMMUNITY): Payer: Self-pay | Admitting: Orthopedic Surgery

## 2020-02-26 DIAGNOSIS — D62 Acute posthemorrhagic anemia: Secondary | ICD-10-CM | POA: Diagnosis present

## 2020-02-26 DIAGNOSIS — S82142S Displaced bicondylar fracture of left tibia, sequela: Secondary | ICD-10-CM | POA: Diagnosis not present

## 2020-02-26 DIAGNOSIS — S2231XD Fracture of one rib, right side, subsequent encounter for fracture with routine healing: Secondary | ICD-10-CM

## 2020-02-26 DIAGNOSIS — I10 Essential (primary) hypertension: Secondary | ICD-10-CM | POA: Diagnosis present

## 2020-02-26 DIAGNOSIS — Z87891 Personal history of nicotine dependence: Secondary | ICD-10-CM

## 2020-02-26 DIAGNOSIS — W11XXXD Fall on and from ladder, subsequent encounter: Secondary | ICD-10-CM | POA: Diagnosis present

## 2020-02-26 DIAGNOSIS — Z7982 Long term (current) use of aspirin: Secondary | ICD-10-CM | POA: Diagnosis not present

## 2020-02-26 DIAGNOSIS — S82402D Unspecified fracture of shaft of left fibula, subsequent encounter for closed fracture with routine healing: Secondary | ICD-10-CM | POA: Diagnosis present

## 2020-02-26 DIAGNOSIS — S82142A Displaced bicondylar fracture of left tibia, initial encounter for closed fracture: Secondary | ICD-10-CM | POA: Diagnosis present

## 2020-02-26 DIAGNOSIS — E119 Type 2 diabetes mellitus without complications: Secondary | ICD-10-CM | POA: Diagnosis present

## 2020-02-26 DIAGNOSIS — S82202D Unspecified fracture of shaft of left tibia, subsequent encounter for closed fracture with routine healing: Secondary | ICD-10-CM | POA: Diagnosis not present

## 2020-02-26 DIAGNOSIS — S82142D Displaced bicondylar fracture of left tibia, subsequent encounter for closed fracture with routine healing: Secondary | ICD-10-CM | POA: Diagnosis not present

## 2020-02-26 DIAGNOSIS — D72829 Elevated white blood cell count, unspecified: Secondary | ICD-10-CM | POA: Diagnosis present

## 2020-02-26 DIAGNOSIS — Z794 Long term (current) use of insulin: Secondary | ICD-10-CM

## 2020-02-26 DIAGNOSIS — K219 Gastro-esophageal reflux disease without esophagitis: Secondary | ICD-10-CM | POA: Diagnosis present

## 2020-02-26 DIAGNOSIS — S270XXD Traumatic pneumothorax, subsequent encounter: Secondary | ICD-10-CM

## 2020-02-26 DIAGNOSIS — W11XXXS Fall on and from ladder, sequela: Secondary | ICD-10-CM | POA: Diagnosis not present

## 2020-02-26 DIAGNOSIS — W11XXXA Fall on and from ladder, initial encounter: Secondary | ICD-10-CM

## 2020-02-26 DIAGNOSIS — S82142K Displaced bicondylar fracture of left tibia, subsequent encounter for closed fracture with nonunion: Secondary | ICD-10-CM

## 2020-02-26 LAB — BASIC METABOLIC PANEL
Anion gap: 7 (ref 5–15)
BUN: 16 mg/dL (ref 8–23)
CO2: 25 mmol/L (ref 22–32)
Calcium: 8.6 mg/dL — ABNORMAL LOW (ref 8.9–10.3)
Chloride: 108 mmol/L (ref 98–111)
Creatinine, Ser: 0.94 mg/dL (ref 0.61–1.24)
GFR calc Af Amer: >60 mL/min (ref >60)
GFR calc non Af Amer: >60 mL/min (ref >60)
Glucose, Bld: 187 mg/dL — ABNORMAL HIGH (ref 70–99)
Potassium: 3.7 mmol/L (ref 3.5–5.1)
Sodium: 140 mmol/L (ref 135–145)

## 2020-02-26 LAB — CBC
HCT: 26.7 % — ABNORMAL LOW (ref 39.0–52.0)
Hemoglobin: 8.5 g/dL — ABNORMAL LOW (ref 13.0–17.0)
MCH: 30.8 pg (ref 26.0–34.0)
MCHC: 31.8 g/dL (ref 30.0–36.0)
MCV: 96.7 fL (ref 80.0–100.0)
Platelets: 305 10*3/uL (ref 150–400)
RBC: 2.76 MIL/uL — ABNORMAL LOW (ref 4.22–5.81)
RDW: 15.1 % (ref 11.5–15.5)
WBC: 11.9 10*3/uL — ABNORMAL HIGH (ref 4.0–10.5)
nRBC: 0 % (ref 0.0–0.2)

## 2020-02-26 LAB — GLUCOSE, CAPILLARY
Glucose-Capillary: 185 mg/dL — ABNORMAL HIGH (ref 70–99)
Glucose-Capillary: 256 mg/dL — ABNORMAL HIGH (ref 70–99)
Glucose-Capillary: 211 mg/dL — ABNORMAL HIGH (ref 70–99)

## 2020-02-26 MED ORDER — GLIMEPIRIDE 2 MG PO TABS
4.0000 mg | ORAL_TABLET | Freq: Every day | ORAL | Status: DC
  Administered 2020-02-27: 4 mg via ORAL
  Filled 2020-02-26: qty 2

## 2020-02-26 MED ORDER — POLYETHYLENE GLYCOL 3350 17 G PO PACK
17.0000 g | PACK | Freq: Every day | ORAL | Status: DC
  Administered 2020-03-04: 17 g via ORAL
  Filled 2020-02-26 (×7): qty 1

## 2020-02-26 MED ORDER — DIPHENHYDRAMINE HCL 12.5 MG/5ML PO ELIX: 12.5000 mg | ORAL_SOLUTION | Freq: Four times a day (QID) | ORAL | Status: DC | PRN

## 2020-02-26 MED ORDER — ENOXAPARIN SODIUM 40 MG/0.4ML ~~LOC~~ SOLN
40.0000 mg | SUBCUTANEOUS | Status: DC
  Administered 2020-02-27 – 2020-03-05 (×8): 40 mg via SUBCUTANEOUS
  Filled 2020-02-26 (×8): qty 0.4

## 2020-02-26 MED ORDER — POLYETHYLENE GLYCOL 3350 17 G PO PACK: 17.0000 g | PACK | Freq: Every day | ORAL | Status: DC | PRN

## 2020-02-26 MED ORDER — DOCUSATE SODIUM 100 MG PO CAPS
100.0000 mg | ORAL_CAPSULE | Freq: Two times a day (BID) | ORAL | Status: DC
  Administered 2020-02-26 – 2020-03-04 (×8): 100 mg via ORAL
  Filled 2020-02-26 (×15): qty 1

## 2020-02-26 MED ORDER — FLEET ENEMA 7-19 GM/118ML RE ENEM: 1.0000 | ENEMA | Freq: Once | RECTAL | Status: DC | PRN

## 2020-02-26 MED ORDER — GUAIFENESIN-DM 100-10 MG/5ML PO SYRP: 5.0000 mL | ORAL_SOLUTION | Freq: Four times a day (QID) | ORAL | Status: DC | PRN

## 2020-02-26 MED ORDER — ACETAMINOPHEN 325 MG PO TABS
325.0000 mg | ORAL_TABLET | ORAL | Status: DC | PRN
  Filled 2020-02-26: qty 2

## 2020-02-26 MED ORDER — ASCORBIC ACID 500 MG PO TABS
500.0000 mg | ORAL_TABLET | Freq: Every day | ORAL | Status: DC
  Administered 2020-02-27 – 2020-03-05 (×8): 500 mg via ORAL
  Filled 2020-02-26 (×8): qty 1

## 2020-02-26 MED ORDER — PROCHLORPERAZINE EDISYLATE 10 MG/2ML IJ SOLN: 5.0000 mg | Freq: Four times a day (QID) | INTRAMUSCULAR | Status: DC | PRN

## 2020-02-26 MED ORDER — POLYSACCHARIDE IRON COMPLEX 150 MG PO CAPS
150.0000 mg | ORAL_CAPSULE | Freq: Every day | ORAL | Status: DC
  Administered 2020-02-26 – 2020-03-04 (×8): 150 mg via ORAL
  Filled 2020-02-26 (×8): qty 1

## 2020-02-26 MED ORDER — ASPIRIN 81 MG PO CHEW
81.0000 mg | CHEWABLE_TABLET | Freq: Every day | ORAL | Status: DC
  Administered 2020-02-27 – 2020-03-05 (×8): 81 mg via ORAL
  Filled 2020-02-26 (×8): qty 1

## 2020-02-26 MED ORDER — DULAGLUTIDE 1.5 MG/0.5ML ~~LOC~~ SOAJ
1.5000 mg | SUBCUTANEOUS | Status: DC
  Administered 2020-02-29: 1.5 mg via SUBCUTANEOUS
  Filled 2020-02-26: qty 1.5

## 2020-02-26 MED ORDER — ACETAMINOPHEN 500 MG PO TABS
500.0000 mg | ORAL_TABLET | Freq: Two times a day (BID) | ORAL | Status: DC
  Administered 2020-02-26 – 2020-03-04 (×7): 500 mg via ORAL
  Filled 2020-02-26 (×15): qty 1

## 2020-02-26 MED ORDER — METHOCARBAMOL 500 MG PO TABS
500.0000 mg | ORAL_TABLET | Freq: Four times a day (QID) | ORAL | Status: DC | PRN
  Administered 2020-02-26 – 2020-03-05 (×7): 500 mg via ORAL
  Filled 2020-02-26 (×7): qty 1

## 2020-02-26 MED ORDER — INSULIN ASPART 100 UNIT/ML ~~LOC~~ SOLN
0.0000 [IU] | Freq: Every day | SUBCUTANEOUS | Status: DC
  Administered 2020-02-26: 2 [IU] via SUBCUTANEOUS

## 2020-02-26 MED ORDER — PANTOPRAZOLE SODIUM 20 MG PO TBEC
20.0000 mg | DELAYED_RELEASE_TABLET | Freq: Every day | ORAL | Status: DC
  Administered 2020-02-27 – 2020-03-05 (×8): 20 mg via ORAL
  Filled 2020-02-26 (×8): qty 1

## 2020-02-26 MED ORDER — TRAZODONE HCL 50 MG PO TABS
25.0000 mg | ORAL_TABLET | Freq: Every evening | ORAL | Status: DC | PRN
  Administered 2020-03-04: 50 mg via ORAL
  Filled 2020-02-26 (×2): qty 1

## 2020-02-26 MED ORDER — INSULIN ASPART 100 UNIT/ML ~~LOC~~ SOLN
0.0000 [IU] | Freq: Three times a day (TID) | SUBCUTANEOUS | Status: DC
  Administered 2020-02-27: 1 [IU] via SUBCUTANEOUS
  Administered 2020-02-27: 08:00:00 2 [IU] via SUBCUTANEOUS
  Administered 2020-02-28 (×3): 1 [IU] via SUBCUTANEOUS
  Administered 2020-02-29: 2 [IU] via SUBCUTANEOUS
  Administered 2020-02-29 (×2): 1 [IU] via SUBCUTANEOUS
  Administered 2020-03-01: 08:00:00 2 [IU] via SUBCUTANEOUS
  Administered 2020-03-01 – 2020-03-05 (×10): 1 [IU] via SUBCUTANEOUS

## 2020-02-26 MED ORDER — BISACODYL 10 MG RE SUPP
10.0000 mg | Freq: Every day | RECTAL | Status: DC | PRN
  Filled 2020-02-26: qty 1

## 2020-02-26 MED ORDER — TRAMADOL HCL 50 MG PO TABS
50.0000 mg | ORAL_TABLET | Freq: Three times a day (TID) | ORAL | Status: DC | PRN
  Administered 2020-02-27 – 2020-03-02 (×8): 100 mg via ORAL
  Administered 2020-03-03: 50 mg via ORAL
  Filled 2020-02-26 (×10): qty 2

## 2020-02-26 MED ORDER — VITAMIN D 25 MCG (1000 UNIT) PO TABS
2000.0000 [IU] | ORAL_TABLET | Freq: Two times a day (BID) | ORAL | Status: DC
  Administered 2020-02-26 – 2020-03-05 (×16): 2000 [IU] via ORAL
  Filled 2020-02-26 (×16): qty 2

## 2020-02-26 MED ORDER — PROCHLORPERAZINE MALEATE 5 MG PO TABS: 5.0000 mg | ORAL_TABLET | Freq: Four times a day (QID) | ORAL | Status: DC | PRN

## 2020-02-26 MED ORDER — CARVEDILOL 3.125 MG PO TABS: 3.1250 mg | ORAL_TABLET | Freq: Two times a day (BID) | ORAL | Status: DC

## 2020-02-26 MED ORDER — METFORMIN HCL ER 500 MG PO TB24
1000.0000 mg | ORAL_TABLET | Freq: Two times a day (BID) | ORAL | Status: DC
  Administered 2020-02-26 – 2020-02-28 (×5): 1000 mg via ORAL
  Filled 2020-02-26 (×6): qty 2

## 2020-02-26 MED ORDER — LISINOPRIL 20 MG PO TABS
20.0000 mg | ORAL_TABLET | Freq: Every day | ORAL | Status: DC
  Administered 2020-02-27 – 2020-03-05 (×8): 20 mg via ORAL
  Filled 2020-02-26 (×8): qty 1

## 2020-02-26 MED ORDER — HYDROCODONE-ACETAMINOPHEN 7.5-325 MG PO TABS
1.0000 | ORAL_TABLET | ORAL | Status: DC | PRN
  Administered 2020-02-26: 2 via ORAL
  Administered 2020-02-26: 1 via ORAL
  Administered 2020-02-27: 2 via ORAL
  Administered 2020-02-27: 1 via ORAL
  Administered 2020-02-27 – 2020-03-03 (×19): 2 via ORAL
  Filled 2020-02-26 (×9): qty 2
  Filled 2020-02-26: qty 1
  Filled 2020-02-26 (×14): qty 2

## 2020-02-26 MED ORDER — ADULT MULTIVITAMIN W/MINERALS CH
1.0000 | ORAL_TABLET | Freq: Every day | ORAL | Status: DC
  Administered 2020-02-27 – 2020-03-05 (×8): 1 via ORAL
  Filled 2020-02-26 (×8): qty 1

## 2020-02-26 MED ORDER — PROCHLORPERAZINE 25 MG RE SUPP: 12.5000 mg | Freq: Four times a day (QID) | RECTAL | Status: DC | PRN

## 2020-02-26 MED ORDER — GABAPENTIN 300 MG PO CAPS
300.0000 mg | ORAL_CAPSULE | Freq: Every day | ORAL | Status: DC
  Administered 2020-02-26 – 2020-03-04 (×8): 300 mg via ORAL
  Filled 2020-02-26 (×8): qty 1

## 2020-02-26 MED ORDER — ROSUVASTATIN CALCIUM 5 MG PO TABS
10.0000 mg | ORAL_TABLET | Freq: Every day | ORAL | Status: DC
  Administered 2020-02-26 – 2020-03-04 (×8): 10 mg via ORAL
  Filled 2020-02-26 (×8): qty 2

## 2020-02-26 MED ORDER — ALUM & MAG HYDROXIDE-SIMETH 200-200-20 MG/5ML PO SUSP: 30.0000 mL | ORAL | Status: DC | PRN

## 2020-02-26 MED ORDER — ENOXAPARIN SODIUM 40 MG/0.4ML ~~LOC~~ SOLN: 40.0000 mg | SUBCUTANEOUS | Status: DC

## 2020-02-26 NOTE — Progress Notes (Signed)
Patient admitted to [facility] via wheelchair with discharge nurse and NT. Pt Mod I assist from chair to bed. Pt oriented to unit, call bell and phone. Pt well adjusted.

## 2020-02-26 NOTE — Clinical Social Work Note (Signed)
CSW received secure chat from [place] with CIR that patient will discharge to their unit today. CSW signing off as no SW intervention services needed.  Genelle Bal, MSW, LCSW Licensed Clinical Social Worker Clinical Social Work Department [employer] [phone]

## 2020-02-26 NOTE — Progress Notes (Signed)
Patient being transferred to CIR.  Report given to [facility], RN, going to room [phone].  All personal belongings gathered and taken with patient.

## 2020-02-26 NOTE — Progress Notes (Signed)
[facility] Progress Note  Patient ID: Cameron Velasquez MRN: [id number] DOB/AGE: [birth year] 64 y.o.  Subjective:  Doing great  Going to CIR today  No acute issues Very happy that he is going to Rehab today   ROS As above  Objective:   VITALS:   Vitals:   02/25/20 1510 02/25/20 1953 02/26/20 0455 02/26/20 0755  BP: 122/70 (!) 142/76 (!) 139/55 (!) 158/90  Pulse: 97 93 85 82  Resp: 17 16 16 17   Temp: 98.4 F (36.9 C) 98.8 F (37.1 C) 98.7 F (37.1 C) 98.4 F (36.9 C)  TempSrc: Oral Oral Oral Oral  SpO2: 100% 97% 98% 98%  Weight:      Height:        Estimated body mass index is 36.96 kg/m as calculated from the following:   Height as of this encounter: 5\' 11"  (1.803 m).   Weight as of this encounter: 120.2 kg.   Intake/Output      04/21 0701 - 04/22 0700 04/22 0701 - 04/23 0700   P.O.     I.V. (mL/kg)     IV Piggyback     Total Intake(mL/kg)     Urine (mL/kg/hr) 400 (0.1)    Drains 0    Stool 1    Total Output 401    Net -401         Urine Occurrence 1 x    Stool Occurrence 1 x      LABS  Results for orders placed or performed during the hospital encounter of 02/16/20 (from the past 24 hour(s))  Glucose, capillary     Status: Abnormal   Collection Time: 02/25/20 11:38 AM  Result Value Ref Range   Glucose-Capillary 233 (H) 70 - 99 mg/dL  Glucose, capillary     Status: Abnormal   Collection Time: 02/25/20  4:12 PM  Result Value Ref Range   Glucose-Capillary 232 (H) 70 - 99 mg/dL  Glucose, capillary     Status: Abnormal   Collection Time: 02/25/20  8:49 PM  Result Value Ref Range   Glucose-Capillary 210 (H) 70 - 99 mg/dL  Basic metabolic panel     Status: Abnormal   Collection Time: 02/26/20  3:19 AM  Result Value Ref Range   Sodium 140 135 - 145 mmol/L   Potassium 3.7 3.5 - 5.1 mmol/L   Chloride 108 98 - 111 mmol/L   CO2 25 22 - 32 mmol/L   Glucose, Bld  187 (H) 70 - 99 mg/dL   BUN 16 8 - 23 mg/dL   Creatinine, Ser 02/27/20 0.61 - 1.24 mg/dL   Calcium 8.6 (L) 8.9 - 10.3 mg/dL   GFR calc non Af Amer >60 >60 mL/min   GFR calc Af Amer >60 >60 mL/min   Anion gap 7 5 - 15  CBC     Status: Abnormal   Collection Time: 02/26/20  3:19 AM  Result Value Ref Range   WBC 11.9 (H) 4.0 - 10.5 K/uL   RBC 2.76 (L) 4.22 - 5.81 MIL/uL   Hemoglobin 8.5 (L) 13.0 - 17.0 g/dL   HCT 3.29 (L) 02/28/20 - 92.4 %   MCV 96.7 80.0 - 100.0 fL   MCH 30.8 26.0 - 34.0 pg   MCHC 31.8 30.0 - 36.0 g/dL   RDW 26.8 34.1 -  15.5 %   Platelets 305 150 - 400 K/uL   nRBC 0.0 0.0 - 0.2 %  Glucose, capillary     Status: Abnormal   Collection Time: 02/26/20  7:27 AM  Result Value Ref Range   Glucose-Capillary 185 (H) 70 - 99 mg/dL     PHYSICAL EXAM:   Gen: resting comfortably in bed, NAD, appears well, pleasant Extremities:      Right Lower Extremity                                All wounds look great             No drainage, no signs of infection              Ext warm              Motor and sensory functions intact             No DCT              Compartments are soft                   Left Lower Extremity              Dressing c/d/i             Hinged brace is on and is locked in full extension as ordered              prevena incisional dressing functioning well             No DCT             Compartments are soft             Distal motor and sensory functions are intact             + DP pulse    Assessment/Plan: 3 Days Post-Op   Principal Problem:   Left tibial fracture Active Problems:   Leukocytosis   Essential hypertension   Diabetes mellitus type 2, controlled ([place])   Fall from ladder   Anti-infectives (From admission, onward)   Start     Dose/Rate Route Frequency Ordered Stop   02/23/20 1730  ceFAZolin (ANCEF) IVPB 1 g/50 mL premix     1 g 100 mL/hr over 30 Minutes Intravenous Every 6 hours 02/23/20 1717 02/24/20 0446   02/23/20 1526  vancomycin  (VANCOCIN) powder  Status:  Discontinued       As needed 02/23/20 1526 02/23/20 1607   02/23/20 1245  ceFAZolin (ANCEF) IVPB 2g/100 mL premix  Status:  Discontinued     2 g 200 mL/hr over 30 Minutes Intravenous  Once 02/23/20 1242 02/23/20 1242   02/23/20 1245  ceFAZolin (ANCEF) 3 g in dextrose 5 % 50 mL IVPB  Status:  Discontinued     3 g 100 mL/hr over 30 Minutes Intravenous  Once 02/23/20 1242 02/23/20 1717   02/17/20 0600  ceFAZolin (ANCEF) 3 g in dextrose 5 % 50 mL IVPB  Status:  Discontinued     3 g 100 mL/hr over 30 Minutes Intravenous On call to O.R. 02/16/20 1757 02/16/20 1948   02/17/20 0400  ceFAZolin (ANCEF) IVPB 2g/100 mL premix     2 g 200 mL/hr over 30 Minutes Intravenous Every 8 hours 02/17/20 0025 02/17/20 2100   02/16/20 2000  ceFAZolin (ANCEF) 3 g in dextrose 5 % 50 mL  IVPB     3 g 100 mL/hr over 30 Minutes Intravenous On call to O.R. 02/16/20 1948 02/16/20 2120    .  POD/HD#: 68  64 y/o male s/p fall off ladder >15 ft    -fall off ladder   - L bicondylar tibial plateau fracture s/p removal of ex fix and ORIF              NWB L leg              Dressing change in 10 days (pt has prevena on)             Aggressive Ice and elevation              Heel cord stretching and unrestricted ankle ROM                Involvement of tibial tuberosity which required ORIF                         No knee ROM x 2 weeks                         hinged brace on at all times, locked in full extension                          After 2 weeks will initiate gentle ROM but no active extension x 6 weeks             continue with PT/OT              stable for transfer to CIR    - traumatic wound R leg s/p I&D and closure                             Ok to leave open to air             Clean with soap and water only               - R PTX s/p chest tube              TS has signed off             No recurrence of PTX on post op xray              Pt without symptoms    - Pain  management:             Continue with current regimen    - ABL anemia/Hemodynamics             Stable             Monitor                          - Medical issues              Per medical team                           AKI                          appears improved  Renal function looks to be at baseline                        HTN                        ACE inhibitor restarted                DM                         Blood sugars have been looking good                          Continue with SSI                             - DVT/PE prophylaxis:             lovenox x 21 days  - ID:              abx completed    - Metabolic Bone Disease:             Vitamin d levels look good   - Activity:             WBAT R leg             NWB L leg   - FEN/GI prophylaxis/Foley/Lines:             Carb mod diet             IVF              - Impediments to fracture healing:             Severe soft tissue injury              High energy injury              DM   - Dispo:            CIR today  Please let us know when pt ready for dc from CIR. We will remove prevena from L leg       Mearl Latin, PA-C [phone] (C) 02/26/2020, 10:31 AM  [facility] [address] [place] [address] [phone] (F)

## 2020-02-26 NOTE — H&P (Signed)
Physical Medicine and Rehabilitation Admission H&P    Chief Complaint  Patient presents with  . Functional deficits due to left tibial plateau Fx, left tib/fib Fx and  right rib fracture with PTX.      HPI: Cameron Velasquez is a 64 year old male with history of HTN, T2DM, GERD, who was admitted on 02/16/20 after falling 15' of a ladder and hit the [facility] unit on the way down. He had reports of right shoulder and bilateral leg pain. Work up done revealing left bicondylar tibial plateau fracture, open left calf wound, open left comminuted proximal tiba and acute non displaced fibular head Fx,  Large left lipohemarthrosis, RLE laceration, right 7th rib fracture with small PTX. He was taken to OR for I & D of BLE with close reduction of left tibia, aspiration of left knee hemarthrosis, placement of external fixator LLE, I & D with closure of RLE traumatic wound with placement of prevena VAC.  Follow up CXR showed large right PTX on 4/13 and chest tube placed by Dr. Donne Hazel with minimal bloody output. This was removed on 4/16 and follow up CXR stable.   He was taken back to OR on 4/19 for removal of external fixator with ORIF --no ROM X 2 week with hinged brace locked out then gentle ROM. No active extension X 6 weeks and NWB LLE. Prevenal removed in OR and RLE wound healing well. Hospital course significant for acute renal failure which has resolved, poorly controlled BS, ABLA as well as persistent leucocytosis. Respiratory status is stable and activity tolerance is improving. Therapy ongoing---is he able to maintain NWB and able to hop on RLE. CIR recommended due to functional decline in ADLs and mobility.   Pt reports his LBM was yesterday- has a wound VAC on LLE, but appears to be one attached to the bed, not a portable VAC- will need to arrange that for therapy.   Notes that pain usually ~5-6/10- meds are helpful when takes them.  Voiding well- likes ice for LLE.  Appetite is good- better than  it was Spacing out pain meds and muscle relaxants- alternating them   Review of Systems  Constitutional: Negative for chills and fever.  HENT: Negative for hearing loss, nosebleeds and tinnitus.   Eyes: Negative for blurred vision and double vision.  Respiratory: Negative for cough, shortness of breath and stridor.   Cardiovascular: Positive for chest pain (right chest wall pain).  Gastrointestinal: Negative for constipation, heartburn and nausea.  Genitourinary: Negative for dysuria and urgency.  Musculoskeletal: Positive for joint pain. Negative for myalgias.  Skin: Negative for rash.  Neurological: Negative for dizziness, sensory change and headaches.  Psychiatric/Behavioral: Negative for hallucinations. The patient does not have insomnia.   All other systems reviewed and are negative.    Past Medical History:  Diagnosis Date  . Diabetes mellitus without complication ([place])   . Hypertension     Past Surgical History:  Procedure Laterality Date  . ARTHROCENTESIS INJECTION Left 02/16/2020   Procedure: Arthrocentesis Injection;  Surgeon: Altamese [place], MD;  Location: [place];  Service: Orthopedics;  Laterality: Left;  . CLOSED REDUCTION TIBIA Left 02/16/2020   Procedure: Closed Reduction Tibia;  Surgeon: Altamese [place], MD;  Location: [place];  Service: Orthopedics;  Laterality: Left;  . EXTERNAL FIXATION LEG Left 02/16/2020   Procedure: EXTERNAL FIXATION LEG;  Surgeon: Altamese [place], MD;  Location: [place];  Service: Orthopedics;  Laterality: Left;  . EXTERNAL FIXATION REMOVAL Left 02/23/2020  Procedure: REMOVAL EXTERNAL FIXATION LEG;  Surgeon: Myrene Galas, MD;  Location: [facility] OR;  Service: Orthopedics;  Laterality: Left;  . I & D EXTREMITY Bilateral 02/16/2020   Procedure: IRRIGATION AND DEBRIDEMENT OPEN FRACTURE EXTREMITY;  Surgeon: Myrene Galas, MD;  [facility];  Service: Orthopedics;  Laterality: Bilateral;  . ORIF TIBIA PLATEAU Left 02/23/2020   Procedure: OPEN REDUCTION  INTERNAL FIXATION (ORIF) TIBIAL PLATEAU;  Surgeon: Myrene Galas, MD;  Location: [facility] OR;  Service: Orthopedics;  Laterality: Left;    Family History  Problem Relation Age of Onset  . Diabetes Mother   . Breast cancer Mother   . Diabetes Father   . Stroke Father   . Stroke Brother   . Diabetes Brother       Social History:  Married. 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 Son/DIL to assist after discharge. He has quit smoking. He has never used smokeless tobacco. No history on file for alcohol and drug.    Allergies: No Known Allergies    Medications Prior to Admission  Medication Sig Dispense Refill  . aspirin 81 MG chewable tablet Chew 81 mg by mouth daily.    Marland Kitchen b complex vitamins capsule Take 1 capsule by mouth daily.    . Dulaglutide (TRULICITY) 1.5 MG/0.5ML SOPN Inject 1.5 mg into the skin every Sunday.    . ergocalciferol (VITAMIN D2) 1.25 MG (50000 UT) capsule Take 50,000 Units by mouth every Monday.    . gabapentin (NEURONTIN) 300 MG capsule Take 300 mg by mouth at bedtime.    Marland Kitchen glimepiride (AMARYL) 2 MG tablet Take 4 mg by mouth daily.    Marland Kitchen lisinopril (ZESTRIL) 20 MG tablet Take 20 mg by mouth at bedtime.    . meloxicam (MOBIC) 15 MG tablet Take 15 mg by mouth.    . metFORMIN (GLUCOPHAGE-XR) 500 MG 24 hr tablet Take 1,000 mg by mouth in the morning and at bedtime.    . pantoprazole (PROTONIX) 20 MG tablet Take 20 mg by mouth daily.    . rosuvastatin (CRESTOR) 10 MG tablet Take 10 mg by mouth at bedtime.    . traMADol (ULTRAM) 50 MG tablet Take 50 mg by mouth at bedtime. for pain      Drug Regimen Review  Drug regimen was reviewed and remains appropriate with no significant issues identified  Home: Home Living Family/patient expects to be discharged to:: Private residence Living Arrangements: Spouse/significant other   Functional History:    Functional  Status:  Mobility:          ADL:    Cognition: Cognition Orientation Level: Oriented X4    Physical Exam: Blood pressure 139/76, pulse 86, temperature 98.2 F (36.8 C), temperature source Oral, resp. rate 20, height 5\' 11"  (1.803 m), weight 129.5 kg, SpO2 100 %. Physical Exam  Nursing note and vitals reviewed. Constitutional: He is oriented to person, place, and time. He appears well-developed and well-nourished.  Older gentleman sitting up in bedside chair, just finished lunch, appropriate, verbose, NAD  HENT:  Head: Normocephalic and atraumatic.  Facial sensation intact Tongue midline No facial droop/face symmetrical  Eyes: Conjunctivae are normal.  EOMI B/L No nystagmus  Neck: No tracheal deviation present.  Cardiovascular:  RRR- no JVD  Respiratory: No stridor. He exhibits tenderness (minimal tendern right upper chest wall).  CTA b/l- good air movement (using ICS regularly) TTP over R middle chest secondary to  rib fx  GI: Soft. Bowel sounds are normal. He exhibits no distension.  Soft, NT, ND, (+)BS  Genitourinary:    Penis normal.   Musculoskeletal:        General: Edema present.     Cervical back: Normal range of motion and neck supple.     Comments: Right shin with sutured incision down 3/4 length of anterior shin with scrapes/abrasions on R knee as well. Resolving ecchymosis anterior aspect. 1+ edema LLE with compressive dressing covering wound VAC and Bledsoe brace keeping leg in extension. UEs- deltoids 5-/5, biceps 5-/5, triceps 5-/5, WE 5-/5, grip 4+/5 and finger abd 5-/5 LEs- RLE_ HF, KE, KF DF and PF at least 4/5 LLE- HF 2-5 secondary to pain; KE/KF not tested due to tibial fx, DF 3/5 and PF 4+/5 Had obvious DJD of hands- esp MCPs and PIPS B/L- some ulnar deviation of fingers   Neurological: He is alert and oriented to person, place, and time.  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    Psychiatric: He has a normal mood and affect.    Results for orders placed or performed during the hospital encounter of 02/26/20 (from the past 48 hour(s))  Glucose, capillary     Status: Abnormal   Collection Time: 02/26/20  4:26 PM  Result Value Ref Range   Glucose-Capillary 256 (H) 70 - 99 mg/dL    Comment: Glucose reference range applies only to samples taken after fasting for at least 8 hours.   No results found.     Medical Problem List and Plan: 1.  Impaired function, ADLs, and mobility secondary to polytrauma- falling from 15 ft ladder- L tibial plateau fx- s/pp ex-fix then closed reduction- is NWB on LLE- laceration RLE- WBAT; and s/p chest tube for tension pneumothorax. Needs to continue wound VAC for LLE for now.   -patient may  Shower once VAC is removed  -ELOS/Goals: 18-12 days- mod I/supervision 2.  Antithrombotics: -DVT/anticoagulation:  Pharmaceutical: Lovenox  -antiplatelet therapy: N/a 3. Pain Management: Vicodin prn effective.- alternating with muscle relaxant- will continue  4. Mood: LCSW to follow for evaluation and support.   -antipsychotic agents: N/A 5. Neuropsych: This patient is capable of making decisions on his own behalf. 6. Skin/Wound Care: Keep wound clean and dry--monitor for healing. Wound VAC on left tibia to stay in place till next week--change out to prevena.  7. Fluids/Electrolytes/Nutrition: Monitor I/O. Appetite has been good. Check lytes in am.. 8. T2DM: Will continue to monitor BS ac/hs and continue SSI. Resume metformin --family to bring in Trulicity--weekly on Sun. Will resume amaryl as indicated. 9. HTN: Monitor BP tid--continue Lisinopril daily. Titrate for better control  10. Leucocytosis: Monitor for signs of infection. Recheck CBC in am. Patient continues to use ICS q1 hours while awake.  11. ABLA: Stable. Add iron  supplement. Recheck labs in am.  12. Right PTX: Resolved. Continue IS.    Evlyn Kanner Love, PA-C 02/26/2020  I have personally performed a face to face diagnostic evaluation of this patient and formulated the key components of the plan.  Additionally, I have personally reviewed laboratory data, imaging studies, as well as relevant notes and concur with the physician assistant's documentation above.   The patient's status has not changed from the original H&P.  Any changes in documentation  from the acute care chart have been noted above.     Genice Rouge, MD 02/26/2020

## 2020-02-26 NOTE — Progress Notes (Signed)
Physical Therapy Treatment Patient Details Name: Cameron Velasquez MRN: [id number] DOB: 07-26-1956 Today's Date: 02/26/2020    History of Present Illness Cameron Velasquez is an 64 y.o. male admitted 02/16/20 after a fall from ladder, 23ft, resulting in left tibia fx, right 7th rib fx, right LE laceration. S/p I&D, closed reduction, external fixation of LLE. s/p I&D of RLE laceration. Pt with R pneumothorax and is s/p chest tube insertion. S/p LLE ex fix removal with ORIF 4/19. PMH includes DM, HTN.    PT Comments    Pt supine in bed on arrival in good spirits.  Pt is very excited about admit to CIR this pm.  Performed ROM to B ankle and focused on quad strengthening.  Reviewed technique and frequency of IS.  Quality 1512mL-2000ml x 10 reps.  Pt performed hop step gt training with RW and R shoe donned.  He is progressing well and remains an excellent candidate for aggressive rehab in a post acute setting.     Follow Up Recommendations  CIR;Supervision for mobility/OOB     Equipment Recommendations  Rolling walker with 5" wheels    Recommendations for Other Services       Precautions / Restrictions Precautions Precautions: Fall;Other (comment) Precaution Comments: LLE wound vac Required Braces or Orthoses: Other Brace Other Brace: Bledsoe brace locked in extension Restrictions Weight Bearing Restrictions: Yes LLE Weight Bearing: Non weight bearing Other Position/Activity Restrictions: No ROM L knee; unrestricted ROM RLE    Mobility  Bed Mobility Overal bed mobility: Needs Assistance Bed Mobility: Supine to Sit     Supine to sit: Min assist     General bed mobility comments: Min assistance to move LLE to edge of bed.  Pt able to move RLE and elevate trunk into a seated position.  Transfers Overall transfer level: Needs assistance Equipment used: Rolling walker (2 wheeled) Transfers: Sit to/from Stand Sit to Stand: From elevated surface;Min assist         General  transfer comment: Pt required elevated bed and boosting to achieve standing.  Donned R shoe for added support in standing.  Ambulation/Gait Ambulation/Gait assistance: Min assist Gait Distance (Feet): 34 Feet Assistive device: Rolling walker (2 wheeled) Gait Pattern/deviations: Step-to pattern;Trunk flexed(hop to pattern) Gait velocity: Decreased   General Gait Details: Cues for upper trunk control, adjusted RW height for improved fit and cues for forward gaze.  Pt required several standing rest periods due to fatigue.  Pt required cues for turning to pivot vs hoping to conserve energy.   Stairs             Wheelchair Mobility    Modified Rankin (Stroke Patients Only)       Balance Overall balance assessment: Needs assistance Sitting-balance support: Feet supported Sitting balance-Leahy Scale: Fair       Standing balance-Leahy Scale: Poor                              Cognition Arousal/Alertness: Awake/alert Behavior During Therapy: WFL for tasks assessed/performed Overall Cognitive Status: Within Functional Limits for tasks assessed                                        Exercises General Exercises - Lower Extremity Ankle Circles/Pumps: AROM;Both;Supine;20 reps Quad Sets: AROM;Both;10 reps;Supine    General Comments        Pertinent Vitals/Pain  Pain Assessment: Faces Pain Score: 5  Pain Location: LLE, R-side ribs Pain Descriptors / Indicators: Grimacing;Guarding;Sore Pain Intervention(s): Monitored during session;Repositioned    Home Living                      Prior Function            PT Goals (current goals can now be found in the care plan section) Acute Rehab PT Goals Patient Stated Goal: Rehab at [facility] before return home Potential to Achieve Goals: Good Progress towards PT goals: Progressing toward goals    Frequency    Min 5X/week      PT Plan Current plan remains appropriate    Co-evaluation               AM-PAC PT "6 Clicks" Mobility   Outcome Measure  Help needed turning from your back to your side while in a flat bed without using bedrails?: A Little Help needed moving from lying on your back to sitting on the side of a flat bed without using bedrails?: A Lot Help needed moving to and from a bed to a chair (including a wheelchair)?: A Little Help needed standing up from a chair using your arms (e.g., wheelchair or bedside chair)?: A Little Help needed to walk in hospital room?: A Little Help needed climbing 3-5 steps with a railing? : A Lot 6 Click Score: 16    End of Session Equipment Utilized During Treatment: Gait belt Activity Tolerance: Patient tolerated treatment well Patient left: in chair;with call bell/phone within reach;with chair alarm set Nurse Communication: Mobility status PT Visit Diagnosis: Unsteadiness on feet (R26.81);History of falling (Z91.81);Difficulty in walking, not elsewhere classified (R26.2)     Time: 3009-2330 PT Time Calculation (min) (ACUTE ONLY): 29 min  Charges:  $Gait Training: 8-22 mins $Therapeutic Exercise: 8-22 mins                     Bonney Leitz , PTA Acute Rehabilitation Services Pager [phone] Office [phone]     Jerl Munyan Artis Delay 02/26/2020, 12:03 PM

## 2020-02-26 NOTE — Discharge Summary (Signed)
Orthopaedic Trauma Service (OTS) Discharge Summary   Patient ID: Cameron Velasquez MRN: [id number] DOB/AGE: 65-Mar-1957 65 y.o.  Admit date: 02/16/2020 Discharge date: 02/26/2020  Admission Diagnoses: Fall from ladder Left tibial plateau fracture  Soft tissue injury R leg  Diabetes HTN  Discharge Diagnoses:  Principal Problem:   Left tibial fracture Active Problems:   Leukocytosis   Essential hypertension   Diabetes mellitus type 2, controlled (HCC)   Fall from ladder   Closed traumatic fracture of ribs of right side with pneumothorax   Past Medical History:  Diagnosis Date  . Diabetes mellitus without complication (HCC)   . Hypertension      Procedures Performed:  02/16/2020- Dr. Carola Frost 1.  Closed reduction of left bicondylar tibial plateau fracture. 2.  Application of spanning external fixator. 3.  Irrigation and debridement with layered closure of left leg wound, 7 cm. 4.  Debridement of open fracture. 5.  Right tibia. 6.  Aspiration of left knee hemarthrosis, 45 mL. 7.  Application of wound VAC, right leg, open fracture wound.  02/17/2020-DrDwain Sarna Pigtail chest tube insertion for right pneumothorax  02/23/2020- Dr. Carola Frost  ORIF left bicondylar tibial plateau fracture Removal of external fixator left leg   Discharged Condition: good  Hospital Course:   64 year old male admitted to [facility] on 02/16/2020 after he sustained a significant fall off of a ladder landing onto a large air conditioning unit.  Patient had significant injury to his bilateral lower extremities.  His right lower extremity sustained significant soft tissue injury in his left leg sustained soft tissue injury along with left bicondylar plateau fracture.  Patient was taken to the operating room on the day of presentation for irrigation debridement of both of his lower leg wounds along with application of a spanning external fixator to his left leg.  His swelling and soft  tissues at to be in good condition despite the significant injury.  We felt that it would be best to keep him in the hospital for aggressive soft tissue management in order to definitively fix his left tibial plateau sooner rather than discharging him and fixing on a delayed basis.  Patient was then taken back to the operating room on 02/23/2020 for ORIF of his left tibial plateau.  Tolerated procedure well.  Transferred back to the orthopedic floor for observation, pain control and therapies.  Patient was very active and engaged in therapy sessions.  Ultimately he was deemed to be stable for inpatient rehab as well as an appropriate candidate.  He was discharged to inpatient rehab on 02/26/2020.  Please see their H&P for full summary  Patient was seen on admission initially by internal medicine for his chronic medical conditions most notably his diabetes and was followed during his hospital stay by them for this.  He was also seen and evaluated by the trauma service for his right rib fractures and pneumothorax.  On 02/17/2020 was noted that he did have a little worsening of his pneumothorax and a pigtail chest tube was placed to address this.  Patient did very well from standpoint evaluating his pneumothorax.  His chest tube was removed on 02/20/2020  Consults: rehabilitation medicine and Trauma service and internal medicine  Significant Diagnostic Studies: labs:   Results for Cameron, Velasquez (MRN [id number]) as of 03/05/2020 16:21  Ref. Range 02/26/2020 03:19  Sodium Latest Ref Range: 135 - 145 mmol/L 140  Potassium Latest Ref Range: 3.5 - 5.1 mmol/L 3.7  Chloride Latest Ref Range: 98 - 111  mmol/L 108  CO2 Latest Ref Range: 22 - 32 mmol/L 25  Glucose Latest Ref Range: 70 - 99 mg/dL 161 (H)  BUN Latest Ref Range: 8 - 23 mg/dL 16  Creatinine Latest Ref Range: 0.61 - 1.24 mg/dL 0.96  Calcium Latest Ref Range: 8.9 - 10.3 mg/dL 8.6 (L)  Anion gap Latest Ref Range: 5 - 15  7  GFR, Est Non African American  Latest Ref Range: >60 mL/min >60  GFR, Est African American Latest Ref Range: >60 mL/min >60  WBC Latest Ref Range: 4.0 - 10.5 K/uL 11.9 (H)  RBC Latest Ref Range: 4.22 - 5.81 MIL/uL 2.76 (L)  Hemoglobin Latest Ref Range: 13.0 - 17.0 g/dL 8.5 (L)  HCT Latest Ref Range: 39.0 - 52.0 % 26.7 (L)  MCV Latest Ref Range: 80.0 - 100.0 fL 96.7  MCH Latest Ref Range: 26.0 - 34.0 pg 30.8  MCHC Latest Ref Range: 30.0 - 36.0 g/dL 04.5  RDW Latest Ref Range: 11.5 - 15.5 % 15.1  Platelets Latest Ref Range: 150 - 400 K/uL 305  nRBC Latest Ref Range: 0.0 - 0.2 % 0.0   Results for Cameron, Velasquez (MRN [id number]) as of 03/05/2020 16:21  Ref. Range 02/17/2020 04:19  Vitamin D, 25-Hydroxy Latest Ref Range: 30 - 100 ng/mL 67.75    Treatments: IV hydration, antibiotics: Ancef, analgesia: acetaminophen, tramadol and norco, anticoagulation: LMW heparin, therapies: PT, OT and RN, procedures: pigtail chest tube and surgery: as above  Discharge Exam:   Orthopaedic Trauma Service Progress Note   Patient ID: Cameron Velasquez MRN: [id number] DOB/AGE: 09/06/1956 64 y.o.   Subjective:   Doing great  Going to CIR today  No acute issues Very happy that he is going to Rehab today    ROS As above   Objective:    VITALS:         Vitals:    02/25/20 1510 02/25/20 1953 02/26/20 0455 02/26/20 0755  BP: 122/70 (!) 142/76 (!) 139/55 (!) 158/90  Pulse: 97 93 85 82  Resp: Temp: 98.4 F (36.9 C) 98.8 F (37.1 C) 98.7 F (37.1 C) 98.4 F (36.9 C)  TempSrc: Oral Oral Oral Oral  SpO2: 100% 97% 98% 98%  Weight:          Height:              Estimated body mass index is 36.96 kg/m as calculated from the following:   Height as of this encounter:  (1.803 m).   Weight as of this encounter: 120.2 kg.     Intake/Output      04/21 0701 - 04/22 0700 04/22 0701 - 04/23 0700   P.O.     I.V. (mL/kg)     IV Piggyback     Total Intake(mL/kg)     Urine (mL/kg/hr) 400 (0.1)    Drains 0     Stool 1    Total Output 401    Net -401         Urine Occurrence 1 x    Stool Occurrence 1 x       LABS   Lab Results Last 24 Hours       Results for orders placed or performed during the hospital encounter of 02/16/20 (from the past 24 hour(s))  Glucose, capillary     Status: Abnormal    Collection Time: 02/25/20 11:38 AM  Result Value Ref Range    Glucose-Capillary 233 (H) 70 - 99 mg/dL  Glucose,  capillary     Status: Abnormal    Collection Time: 02/25/20  4:12 PM  Result Value Ref Range    Glucose-Capillary 232 (H) 70 - 99 mg/dL  Glucose, capillary     Status: Abnormal    Collection Time: 02/25/20  8:49 PM  Result Value Ref Range    Glucose-Capillary 210 (H) 70 - 99 mg/dL  Basic metabolic panel     Status: Abnormal    Collection Time: 02/26/20  3:19 AM  Result Value Ref Range    Sodium 140 135 - 145 mmol/L    Potassium 3.7 3.5 - 5.1 mmol/L    Chloride 108 98 - 111 mmol/L    CO2 25 22 - 32 mmol/L    Glucose, Bld 187 (H) 70 - 99 mg/dL    BUN 16 8 - 23 mg/dL    Creatinine, Ser 1.61 0.61 - 1.24 mg/dL    Calcium 8.6 (L) 8.9 - 10.3 mg/dL    GFR calc non Af Amer >60 >60 mL/min    GFR calc Af Amer >60 >60 mL/min    Anion gap 7 5 - 15  CBC     Status: Abnormal    Collection Time: 02/26/20  3:19 AM  Result Value Ref Range    WBC 11.9 (H) 4.0 - 10.5 K/uL    RBC 2.76 (L) 4.22 - 5.81 MIL/uL    Hemoglobin 8.5 (L) 13.0 - 17.0 g/dL    HCT 09.6 (L) 04.5 - 52.0 %    MCV 96.7 80.0 - 100.0 fL    MCH 30.8 26.0 - 34.0 pg    MCHC 31.8 30.0 - 36.0 g/dL    RDW 40.9 81.1 - 91.4 %    Platelets 305 150 - 400 K/uL    nRBC 0.0 0.0 - 0.2 %  Glucose, capillary     Status: Abnormal    Collection Time: 02/26/20  7:27 AM  Result Value Ref Range    Glucose-Capillary 185 (H) 70 - 99 mg/dL          PHYSICAL EXAM:    Gen: resting comfortably in bed, NAD, appears well, pleasant Extremities:      Right Lower Extremity                                All wounds look great             No  drainage, no signs of infection              Ext warm              Motor and sensory functions intact             No DCT              Compartments are soft                   Left Lower Extremity              Dressing c/d/i             Hinged brace is on and is locked in full extension as ordered              prevena incisional dressing functioning well             No DCT             Compartments are soft  Distal motor and sensory functions are intact             + DP pulse     Assessment/Plan: 3 Days Post-Op    Principal Problem:   Left tibial fracture Active Problems:   Leukocytosis   Essential hypertension   Diabetes mellitus type 2, controlled (HCC)   Fall from ladder                Anti-infectives (From admission, onward)      Start     Dose/Rate Route Frequency Ordered Stop    02/23/20 1730   ceFAZolin (ANCEF) IVPB 1 g/50 mL premix     1 g 100 mL/hr over 30 Minutes Intravenous Every 6 hours 02/23/20 1717 02/24/20 0446    02/23/20 1526   vancomycin (VANCOCIN) powder  Status:  Discontinued         As needed 02/23/20 1526 02/23/20 1607    02/23/20 1245   ceFAZolin (ANCEF) IVPB 2g/100 mL premix  Status:  Discontinued     2 g 200 mL/hr over 30 Minutes Intravenous  Once 02/23/20 1242 02/23/20 1242    02/23/20 1245   ceFAZolin (ANCEF) 3 g in dextrose 5 % 50 mL IVPB  Status:  Discontinued     3 g 100 mL/hr over 30 Minutes Intravenous  Once 02/23/20 1242 02/23/20 1717    02/17/20 0600   ceFAZolin (ANCEF) 3 g in dextrose 5 % 50 mL IVPB  Status:  Discontinued     3 g 100 mL/hr over 30 Minutes Intravenous On call to O.R. 02/16/20 1757 02/16/20 1948    02/17/20 0400   ceFAZolin (ANCEF) IVPB 2g/100 mL premix     2 g 200 mL/hr over 30 Minutes Intravenous Every 8 hours 02/17/20 0025 02/17/20 2100    02/16/20 2000   ceFAZolin (ANCEF) 3 g in dextrose 5 % 50 mL IVPB     3 g 100 mL/hr over 30 Minutes Intravenous On call to O.R. 02/16/20 1948 02/16/20 2120       .     POD/HD#: 50   64 y/o male s/p fall off ladder >15 ft    -fall off ladder   - L bicondylar tibial plateau fracture s/p removal of ex fix and ORIF              NWB L leg              Dressing change in 10 days (pt has prevena on)             Aggressive Ice and elevation              Heel cord stretching and unrestricted ankle ROM                Involvement of tibial tuberosity which required ORIF                         No knee ROM x 2 weeks                         hinged brace on at all times, locked in full extension                          After 2 weeks will initiate gentle ROM but no active extension x 6 weeks  continue with PT/OT              stable for transfer to CIR    - traumatic wound R leg s/p I&D and closure                             Ok to leave open to air             Clean with soap and water only               - R PTX s/p chest tube              TS has signed off             No recurrence of PTX on post op xray              Pt without symptoms    - Pain management:             Continue with current regimen    - ABL anemia/Hemodynamics             Stable             Monitor                          - Medical issues              Per medical team                           AKI                          appears improved                         Renal function looks to be at baseline                        HTN                        ACE inhibitor restarted                DM                         Blood sugars have been looking good                          Continue with SSI                             - DVT/PE prophylaxis:             lovenox x 21 days  - ID:              abx completed    - Metabolic Bone Disease:             Vitamin d levels look good   - Activity:             WBAT R leg             NWB L leg   - FEN/GI prophylaxis/Foley/Lines:  Carb mod diet             IVF              - Impediments to fracture  healing:             Severe soft tissue injury              High energy injury              DM   - Dispo:            CIR today             Please let us know when pt ready for dc from CIR. We will remove prevena from L leg        Disposition: Discharge disposition: Cameron Velasquez      Medications to be reconciled upon admission to inpatient rehab  Allergies as of 02/26/2020   No Known Allergies     Medication List    ASK your doctor about these medications   aspirin 81 MG chewable tablet Chew 81 mg by mouth daily.   b complex vitamins capsule Take 1 capsule by mouth daily.   gabapentin 300 MG capsule Commonly known as: NEURONTIN Take 300 mg by mouth at bedtime.   lisinopril 20 MG tablet Commonly known as: ZESTRIL Take 20 mg by mouth at bedtime.   pantoprazole 20 MG tablet Commonly known as: PROTONIX Take 20 mg by mouth daily.   rosuvastatin 10 MG tablet Commonly known as: CRESTOR Take 10 mg by mouth at bedtime.   Trulicity 1.5 MG/0.5ML Sopn Generic drug: Dulaglutide Inject 1.5 mg into the skin every Sunday.      Follow-up Information    [facility]. Schedule an appointment as soon as possible for a visit on 03/11/2020.   Why: at 9am. Please arrive 30 min prior to your appointment for paperwork. Please bring a copy of your photo ID and insurance card.  Contact information: [address] [place] [id number] [phone]       [facility] Go on 03/10/2020.   Why: Please obtain a chest xray the day before your appointment with the trauma clinic. You can walk into [facility] to obtain this xray.  Contact information: [address] [place] [id number] [phone]        Myrene GalasHandy, Michael, MD. Schedule an appointment as soon as possible for a visit in 3 week(s).   Specialty: Orthopedic Surgery Contact information: [address] [place] [address] [phone]           Discharge Instructions and Plan:  64 y/o male s/p fall off ladder >15 ft    -fall off ladder   - L bicondylar tibial plateau fracture s/p removal of ex fix and ORIF              NWB L leg              Dressing change in 10 days (pt has prevena on)             Aggressive Ice and elevation              Heel cord stretching and unrestricted ankle ROM                Involvement of tibial tuberosity which required ORIF  No knee ROM x 2 weeks                         hinged brace on at all times, locked in full extension                          After 2 weeks will initiate gentle ROM but no active extension x 6 weeks             continue with PT/OT              stable for transfer to CIR    - traumatic wound R leg s/p I&D and closure                             Ok to leave open to air             Clean with soap and water only               - R PTX s/p chest tube              TS has signed off             No recurrence of PTX on post op xray              Pt without symptoms    - Pain management:             Continue with current regimen    - ABL anemia/Hemodynamics             Stable             Monitor                          - Medical issues              Per medical team                           AKI                          appears improved                         Renal function looks to be at baseline                        HTN                        ACE inhibitor restarted                DM                         Blood sugars have been looking good                          Continue with SSI                             - DVT/PE prophylaxis:             lovenox  x 21 days  - ID:              abx completed    - Metabolic Bone Disease:             Vitamin d levels look good   - Activity:             WBAT R leg             NWB L leg   - FEN/GI prophylaxis/Foley/Lines:              Carb mod diet             IVF              - Impediments to fracture healing:             Severe soft tissue injury              High energy injury              DM   - Dispo:            CIR today             Please let us know when pt ready for dc from CIR. We will remove prevena from L leg   Signed:  Jari Pigg, PA-C [phone] (C) 03/05/2020, 4:22 PM  [facility] [place] [phone] (F)

## 2020-02-26 NOTE — Progress Notes (Signed)
Inpatient Rehabilitation  Patient information reviewed and entered into eRehab system by Earl[place] Shellhammer M. Jennica Tagliaferri, M.A., CCC/SLP, PPS Coordinator.  Information including medical coding, functional ability and quality indicators will be reviewed and updated through discharge.    

## 2020-02-26 NOTE — Progress Notes (Signed)
Courtney Heys, MD  Physician  Physical Medicine and Rehabilitation  PMR Pre-admission      Signed  Date of Service:  02/24/2020  1:14 PM      Related encounter: ED to Hosp-Admission (Current) from 02/16/2020 in [place]        PMR Admission Coordinator Pre-Admission Assessment   Patient: Cameron Velasquez is an 64 y.o., male MRN: [id number] DOB: 08/01/1956 Height: 5' 11"  (180.3 cm) Weight: 120.2 kg   Insurance Information HMO:     PPO:      PCP:      IPA:      80/20: yes     OTHER:  PRIMARY: Medicare A and B      Policy#: [id number]      Subscriber: patient CM Name:       Phone#:      Fax#:  Pre-Cert#:       Employer:  Benefits:  Phone #: verified online     Name: verified eligibility online via [place] on 02/25/20 Eff. Date: Part A and B effective 01/05/20     Deduct: $1,484      Out of Pocket Max: NA      Life Max: NA CIR: Covered per Medicare guidelines once yearly deductible has been met      SNF: days 1-20, 100%; days 21-100, 80%.  Outpatient: 80%     Co-Pay: 20% Home Health: 100%      Co-Pay:  DME: 80%     Co-Pay: 20% Providers: Pt's choice  SECONDARY: None       Policy#:      Phone#:    Development worker, community:       Phone#:    The Engineer, petroleum" for patients in Inpatient Rehabilitation Facilities with attached "Privacy Act [place] Records" was provided and verbally reviewed with: Patient   Emergency Contact Information         Contact Information     Name Relation Home Work Mobile    Constatelo,Kathryn Spouse     [phone]         Current Medical History  Patient Admitting Diagnosis: multi-ortho trauma after fall from 15 foot ladder   History of Present Illness: Pt is a 64 yo male who fell off a ladder from 15 feet. Post fall, he had immediate lower extremity pain and was admitted to [facility] on 4/12 as a level 2 trauma. Upon admission, pt complained of right shoulder, left  leg, and right rib cage pain. Work-up revealed mild displaced right seventh rib fracture with small right basilar pneumothorax, comminuted fracture of the proximal tibia on the left, and a right lower extremity laceration. On 4/12, pt underwent surgery with Dr. Marcelino Scot for closed reduction of left bicondylar tibial plateau fracture, application of spanning external fixator, irrigation and debridement with layered closure of left leg wound, debridement of open fracture, right tibia, aspiration of left knee hemarthrosis, and application of a wound VAC on the right leg for open fracture wound. On 4/13, pt underwent a right pigtail thoracostomy by Dr. Donne Hazel for tension pneumothorax. Chest tube was pulled on 4/16 with no residual pnemothorax. On 4/19, pt returned to OR for removal of external fixation and ORIF of left bicondylar tibial plateau fracture. Pt is to be NWB LLE with no knee ROM x2 weeks with hinged brace. Pt was seen by therapies with recommendation for CIR. Pt is to admit to CIR on 02/26/20.  Patient's medical record from [facility] has been reviewed by the rehabilitation admission coordinator and physician.   Past Medical History      Past Medical History:  Diagnosis Date  . Diabetes mellitus without complication ([place])    . Hypertension        Family History   family history is not on file.   Prior Rehab/Hospitalizations Has the patient had prior rehab or hospitalizations prior to admission? No   Has the patient had major surgery during 100 days prior to admission? Yes              Current Medications   Current Facility-Administered Medications:  .  acetaminophen (TYLENOL) tablet 325-650 mg, 325-650 mg, Oral, Q6H PRN, Ainsley Spinner, PA-C .  acetaminophen (TYLENOL) tablet 500 mg, 500 mg, Oral, Q12H, Ainsley Spinner, PA-C, 500 mg at 02/26/20 0949 .  ascorbic acid (VITAMIN C) tablet 500 mg, 500 mg, Oral, Daily, Ainsley Spinner, PA-C, 500 mg at 02/26/20 0949 .  aspirin  chewable tablet 81 mg, 81 mg, Oral, Daily, Ainsley Spinner, PA-C, 81 mg at 02/26/20 0949 .  carvedilol (COREG) tablet 3.125 mg, 3.125 mg, Oral, BID WC, Pokhrel, Laxman, MD .  cholecalciferol (VITAMIN D3) tablet 2,000 Units, 2,000 Units, Oral, BID, Ainsley Spinner, PA-C, 2,000 Units at 02/26/20 7510 .  docusate sodium (COLACE) capsule 100 mg, 100 mg, Oral, BID, Ainsley Spinner, PA-C, 100 mg at 02/26/20 0949 .  enoxaparin (LOVENOX) injection 40 mg, 40 mg, Subcutaneous, Q24H, Ainsley Spinner, PA-C, 40 mg at 02/26/20 0948 .  gabapentin (NEURONTIN) capsule 300 mg, 300 mg, Oral, QHS, Ainsley Spinner, PA-C, 300 mg at 02/25/20 2229 .  hydrALAZINE (APRESOLINE) tablet 25 mg, 25 mg, Oral, Q8H PRN, Ainsley Spinner, PA-C, 25 mg at 02/18/20 2100 .  HYDROcodone-acetaminophen (NORCO) 7.5-325 MG per tablet 1-2 tablet, 1-2 tablet, Oral, Q4H PRN, Ainsley Spinner, PA-C, 2 tablet at 02/25/20 0157 .  HYDROcodone-acetaminophen (NORCO/VICODIN) 5-325 MG per tablet 1-2 tablet, 1-2 tablet, Oral, Q4H PRN, Ainsley Spinner, PA-C, 2 tablet at 02/26/20 0954 .  insulin aspart (novoLOG) injection 0-5 Units, 0-5 Units, Subcutaneous, QHS, [place] Eric J, DO, 2 Units at 02/25/20 2230 .  insulin aspart (novoLOG) injection 0-9 Units, 0-9 Units, Subcutaneous, TID WC, [place] Eric J, DO, 2 Units at 02/26/20 2585 .  lactated ringers infusion, , Intravenous, Continuous, Ainsley Spinner, PA-C, Last Rate: 10 mL/hr at 02/23/20 1644, New Bag at 02/23/20 1644 .  lisinopril (ZESTRIL) tablet 20 mg, 20 mg, Oral, Daily, Ainsley Spinner, PA-C, 20 mg at 02/26/20 0949 .  methocarbamol (ROBAXIN) tablet 500-1,000 mg, 500-1,000 mg, Oral, Q6H PRN, 1,000 mg at 02/26/20 0646 **OR** methocarbamol (ROBAXIN) 500 mg in dextrose 5 % 50 mL IVPB, 500 mg, Intravenous, Q6H PRN, Ainsley Spinner, PA-C .  metoCLOPramide (REGLAN) tablet 5-10 mg, 5-10 mg, Oral, Q8H PRN **OR** metoCLOPramide (REGLAN) injection 5-10 mg, 5-10 mg, Intravenous, Q8H PRN, Ainsley Spinner, PA-C .  morphine 2 MG/ML injection 0.5-1 mg, 0.5-1 mg,  Intravenous, Q2H PRN, Ainsley Spinner, PA-C, 1 mg at 02/24/20 0212 .  multivitamin with minerals tablet 1 tablet, 1 tablet, Oral, Daily, Ainsley Spinner, PA-C, 1 tablet at 02/26/20 0949 .  ondansetron (ZOFRAN) tablet 4 mg, 4 mg, Oral, Q6H PRN **OR** ondansetron (ZOFRAN) injection 4 mg, 4 mg, Intravenous, Q6H PRN, Ainsley Spinner, PA-C .  pantoprazole (PROTONIX) EC tablet 20 mg, 20 mg, Oral, Daily, Ainsley Spinner, PA-C, 20 mg at 02/26/20 0948 .  polyethylene glycol (MIRALAX / GLYCOLAX) packet 17 g, 17 g, Oral, Daily, Eddie Dibbles,  Lanny Hurst, PA-C, 17 g at 02/21/20 1007 .  rosuvastatin (CRESTOR) tablet 10 mg, 10 mg, Oral, QHS, Ainsley Spinner, PA-C, 10 mg at 02/25/20 2229 .  traMADol (ULTRAM) tablet 50-100 mg, 50-100 mg, Oral, Q8H PRN, Ainsley Spinner, PA-C, 50 mg at 02/24/20 1828   Patients Current Diet:     Diet Order                      Diet Carb Modified Fluid consistency: Thin; Room service appropriate? Yes  Diet effective now                   Precautions / Restrictions Precautions Precautions: Fall, Other (comment) Precaution Comments: LLE wound vac Other Brace: Bledsoe brace locked in extension Restrictions Weight Bearing Restrictions: Yes RLE Weight Bearing: Weight bearing as tolerated LLE Weight Bearing: Non weight bearing Other Position/Activity Restrictions: No ROM L knee; unrestricted ROM RLE    Has the patient had 2 or more falls or a fall with injury in the past year? Yes   Prior Activity Level Community (5-7x/wk): works full time as Biomedical scientist for [employer]; he is caregiver for his wife.    Prior Functional Level Self Care: Did the patient need help bathing, dressing, using the toilet or eating? Independent   Indoor Mobility: Did the patient need assistance with walking from room to room (with or without device)? Independent   Stairs: Did the patient need assistance with internal or external stairs (with or without device)? Independent   Functional Cognition: Did the patient need help  planning regular tasks such as shopping or remembering to take medications? Independent   Home Equities trader / Equipment Home Assistive Devices/Equipment: None Home Equipment: Shower seat, Grab bars - tub/shower, Grab bars - toilet, Walker - 2 wheels, Walker - 4 wheels   Prior Device Use: Indicate devices/aids used by the patient prior to current illness, exacerbation or injury? None of the above   Current Functional Level Cognition   Overall Cognitive Status: Within Functional Limits for tasks assessed Orientation Level: Oriented X4    Extremity Assessment (includes Sensation/Coordination)   Upper Extremity Assessment: Overall WFL for tasks assessed RUE Deficits / Details: reports pain in ribs with movement but otherwise WFL RUE: Unable to fully assess due to pain  Lower Extremity Assessment: Defer to PT evaluation RLE Deficits / Details: pt reports no pain with weight bearing. able to perform SLR LLE Deficits / Details: LLE to be maintained in extension     ADLs   Overall ADL's : Needs assistance/impaired Eating/Feeding: Set up, Sitting Eating/Feeding Details (indicate cue type and reason): left pt with lunch tray Grooming: Min guard, Sitting Upper Body Bathing: Minimal assistance, Sitting Lower Body Bathing: Moderate assistance Lower Body Bathing Details (indicate cue type and reason): modA to access BLE minguard for sit<>stand Upper Body Dressing : Minimal assistance, Sitting Lower Body Dressing: Moderate assistance, Sit to/from stand Lower Body Dressing Details (indicate cue type and reason): modA to don knee immobilizer;minguard to stand Toilet Transfer: Minimal assistance, Ambulation Toilet Transfer Details (indicate cue type and reason): 4 steps from recliner to bed with minA for stability  Toileting- Clothing Manipulation and Hygiene: Min guard, Sit to/from stand Toileting - Clothing Manipulation Details (indicate cue type and reason): pt's saturated in urine, modA  for thorough pericare Functional mobility during ADLs: Minimal assistance, Rolling walker General ADL Comments: pt limited by pain, decreased activity tolerance, elevated HR up to 123 with standing     Mobility  Overal bed mobility: Needs Assistance Bed Mobility: Sit to Supine Supine to sit: Mod assist Sit to supine: Min assist General bed mobility comments: Received sitting in recliner     Transfers   Overall transfer level: Needs assistance Equipment used: Rolling walker (2 wheeled) Transfers: Sit to/from Stand Sit to Stand: Min guard, Mod assist Stand pivot transfers: Min assist General transfer comment: Reliant on momentum to power into standing; modA for LLE management in order to maintain LLE NWB precautions when going to sit as pt unable to lift leg     Ambulation / Gait / Stairs / Wheelchair Mobility   Ambulation/Gait Ambulation/Gait assistance: Min guard, Herbalist (Feet): 30 Feet Assistive device: Rolling walker (2 wheeled) General Gait Details: Improving ability to hop on RLE with RW in order to maintain LLE NWB precautions; pt required 4x standing rest break secondary to SOB and fatigue, limited by R-side rib pain and LLE pain; intermittent minA to prevent LOB; cues for pursed lip breathing Gait velocity: Decreased     Posture / Balance Balance Overall balance assessment: Needs assistance Sitting-balance support: Feet supported Sitting balance-Leahy Scale: Fair Standing balance support: Bilateral upper extremity supported Standing balance-Leahy Scale: Poor Standing balance comment: Reliant on UE and external support      Special needs/care consideration Wound Vac : yes, to LLE, Skin: abrasion to right knee, surgical incision to left leg, surgical incision to right leg.  Diabetic management: yes Designated visitor : unknown at this time.     Previous Home Environment (from acute therapy documentation) Living Arrangements: Spouse/significant  other Available Help at Discharge: Family Type of Home: House Home Layout: One level Home Access: Stairs to enter Entrance Stairs-Rails: None Technical brewer of Steps: 2 Bathroom Shower/Tub: Multimedia programmer: Programmer, systems: Yes How Accessible: Accessible via walker Beattie: No Additional Comments: pt's daughters coming in from out of town, son and daughter-in-law live 5 min away   Discharge Living Setting Plans for Discharge Living Setting: Patient's home, Lives with (comment)(wife) Type of Home at Discharge: House Discharge Home Layout: One level Discharge Home Access: Stairs to enter Entrance Stairs-Rails: Right Entrance Stairs-Number of Steps: 4 Discharge Bathroom Shower/Tub: Tub/shower unit, Walk-in shower Discharge Bathroom Toilet: Standard(but can put elevated seat over it) Discharge Bathroom Accessibility: Yes How Accessible: Accessible via walker Does the patient have any problems obtaining your medications?: No   Social/Family/Support Systems Patient Roles: Spouse, Other (Comment)(full time employee) Contact Information: wife: Lyndel Pleasure [phone]; son: Larkin Ina [phone] Anticipated Caregiver: wife and son (as needed but he works 12 hr shifts) Anticipated Ambulance person Information: see above Ability/Limitations of Caregiver: supervision/light min A (wife has limitations and uses rollator herself) Caregiver Availability: 24/7 Discharge Plan Discussed with Primary Caregiver: Yes(wife) Is Caregiver In Agreement with Plan?: Yes Does Caregiver/Family have Issues with Lodging/Transportation while Pt is in Rehab?: No   Goals Patient/Family Goal for Rehab: PT/OT: Mod I/Supervision; SLP: NA Expected length of stay: 5-8 days Cultural Considerations: NA Pt/Family Agrees to Admission and willing to participate: Yes Program Orientation Provided & Reviewed with Pt/Caregiver Including Roles  & Responsibilities: Yes(pt and  his wife)  Barriers to Discharge: Home environment access/layout, Lack of/limited family support, Weight bearing restrictions  Barriers to Discharge Comments: caregiver is limited in physical assist; steps to enter home.    Decrease burden of Care through IP rehab admission: NA   Possible need for SNF placement upon discharge: Not anticipated; pt has supervision from his wife at DC and anticipate he  can progress to supervision level quickly after CIR stay.    Patient Condition: I have reviewed medical records from [facility], spoken with PA, RN, and patient and spouse. I met with patient at the bedside for inpatient rehabilitation assessment.  Patient will benefit from ongoing PT and OT, can actively participate in 3 hours of therapy a day 5 days of the week, and can make measurable gains during the admission.  Patient will also benefit from the coordinated team approach during an Inpatient Acute Rehabilitation admission.  The patient will receive intensive therapy as well as Rehabilitation physician, nursing, social worker, and care management interventions.  Due to safety, skin/wound care, disease management, medication administration, pain management and patient education the patient requires 24 hour a day rehabilitation nursing.  The patient is currently Min G/Min A with mobility and Min G to Mod A for basic ADLs.  Discharge setting and therapy post discharge at home with home health is anticipated.  Patient has agreed to participate in the Acute Inpatient Rehabilitation Program and will admit 02/26/20.   Preadmission Screen Completed By:  Raechel Ache, 02/26/2020 12:01 PM ______________________________________________________________________   Discussed status with Dr. Dagoberto Ligas on 02/26/20 at 12:01PM and received approval for admission today.   Admission Coordinator:  Raechel Ache, OT, time 12:01PM/Date 02/26/20    Assessment/Plan: Diagnosis: 1. Does the need for close, 24 hr/day  Medical supervision in concert with the patient's rehab needs make it unreasonable for this patient to be served in a less intensive setting? Yes 2. Co-Morbidities requiring supervision/potential complications: NWB LLE- wound vac in place; R rib fx, s/p tension pneumothorax s/p chest tube 3. Due to bladder management, safety, skin/wound care, disease management, medication administration, pain management and patient education, does the patient require 24 hr/day rehab nursing? Yes 4. Does the patient require coordinated care of a physician, rehab nurse, PT, OT, and SLP to address physical and functional deficits in the context of the above medical diagnosis(es)? Yes Addressing deficits in the following areas: balance, endurance, locomotion, strength, transferring, bathing, dressing, feeding, grooming and toileting 5. Can the patient actively participate in an intensive therapy program of at least 3 hrs of therapy 5 days a week? Yes 6. The potential for patient to make measurable gains while on inpatient rehab is good 7. Anticipated functional outcomes upon discharge from inpatient rehab: modified independent and supervision PT, modified independent and supervision OT, n/a SLP 8. Estimated rehab length of stay to reach the above functional goals is: 5-8 days 9. Anticipated discharge destination: Home 10. Overall Rehab/Functional Prognosis: good     MD Signature:          Revision History

## 2020-02-26 NOTE — Progress Notes (Signed)
Inpatient Rehabilitation-Admissions Coordinator   I have received medical clearance from attending service for admit to CIR today. Pt aware and wants to proceed. He is very excited about CIR. We reviewed insurance benefits letter and consent forms. All questions answered at this time. TOC team and RN aware of plan for today.   Please call if questions.   Cheri Rous, OTR/L  Rehab Admissions Coordinator  [phone] 02/26/2020 10:46 AM

## 2020-02-27 ENCOUNTER — Inpatient Hospital Stay (HOSPITAL_COMMUNITY): Payer: Medicare Other | Admitting: Physical Therapy

## 2020-02-27 ENCOUNTER — Inpatient Hospital Stay (HOSPITAL_COMMUNITY): Payer: Medicare Other | Admitting: Occupational Therapy

## 2020-02-27 DIAGNOSIS — S82142D Displaced bicondylar fracture of left tibia, subsequent encounter for closed fracture with routine healing: Secondary | ICD-10-CM

## 2020-02-27 LAB — CBC WITH DIFFERENTIAL/PLATELET
Abs Immature Granulocytes: 0.11 10*3/uL — ABNORMAL HIGH (ref 0.00–0.07)
Basophils Absolute: 0 10*3/uL (ref 0.0–0.1)
Basophils Relative: 0 %
Eosinophils Absolute: 0.2 10*3/uL (ref 0.0–0.5)
Eosinophils Relative: 1 %
HCT: 26.3 % — ABNORMAL LOW (ref 39.0–52.0)
Hemoglobin: 8.5 g/dL — ABNORMAL LOW (ref 13.0–17.0)
Immature Granulocytes: 1 %
Lymphocytes Relative: 11 %
Lymphs Abs: 1.3 10*3/uL (ref 0.7–4.0)
MCH: 30.6 pg (ref 26.0–34.0)
MCHC: 32.3 g/dL (ref 30.0–36.0)
MCV: 94.6 fL (ref 80.0–100.0)
Monocytes Absolute: 0.7 10*3/uL (ref 0.1–1.0)
Monocytes Relative: 6 %
Neutro Abs: 9.8 10*3/uL — ABNORMAL HIGH (ref 1.7–7.7)
Neutrophils Relative %: 81 %
Platelets: 310 10*3/uL (ref 150–400)
RBC: 2.78 MIL/uL — ABNORMAL LOW (ref 4.22–5.81)
RDW: 15.1 % (ref 11.5–15.5)
WBC: 12.1 10*3/uL — ABNORMAL HIGH (ref 4.0–10.5)
nRBC: 0 % (ref 0.0–0.2)

## 2020-02-27 LAB — COMPREHENSIVE METABOLIC PANEL
ALT: 12 U/L (ref 0–44)
AST: 14 U/L — ABNORMAL LOW (ref 15–41)
Albumin: 2.8 g/dL — ABNORMAL LOW (ref 3.5–5.0)
Alkaline Phosphatase: 62 U/L (ref 38–126)
Anion gap: 7 (ref 5–15)
BUN: 14 mg/dL (ref 8–23)
CO2: 25 mmol/L (ref 22–32)
Calcium: 8.6 mg/dL — ABNORMAL LOW (ref 8.9–10.3)
Chloride: 107 mmol/L (ref 98–111)
Creatinine, Ser: 0.72 mg/dL (ref 0.61–1.24)
GFR calc Af Amer: >60 mL/min (ref >60)
GFR calc non Af Amer: >60 mL/min (ref >60)
Glucose, Bld: 187 mg/dL — ABNORMAL HIGH (ref 70–99)
Potassium: 3.5 mmol/L (ref 3.5–5.1)
Sodium: 139 mmol/L (ref 135–145)
Total Bilirubin: 1.1 mg/dL (ref 0.3–1.2)
Total Protein: 5.4 g/dL — ABNORMAL LOW (ref 6.5–8.1)

## 2020-02-27 LAB — GLUCOSE, CAPILLARY
Glucose-Capillary: 197 mg/dL — ABNORMAL HIGH (ref 70–99)
Glucose-Capillary: 181 mg/dL — ABNORMAL HIGH (ref 70–99)
Glucose-Capillary: 101 mg/dL — ABNORMAL HIGH (ref 70–99)
Glucose-Capillary: 127 mg/dL — ABNORMAL HIGH (ref 70–99)
Glucose-Capillary: 146 mg/dL — ABNORMAL HIGH (ref 70–99)

## 2020-02-27 MED ORDER — PRO-STAT SUGAR FREE PO LIQD
30.0000 mL | Freq: Three times a day (TID) | ORAL | Status: DC
  Administered 2020-02-27 – 2020-03-05 (×21): 30 mL via ORAL
  Filled 2020-02-27 (×21): qty 30

## 2020-02-27 MED ORDER — JUVEN PO PACK
1.0000 | PACK | Freq: Two times a day (BID) | ORAL | Status: DC
  Administered 2020-02-27 – 2020-02-29 (×4): 1 via ORAL
  Filled 2020-02-27 (×5): qty 1

## 2020-02-27 NOTE — Progress Notes (Addendum)
Inpatient Rehabilitation Medication Review by a Pharmacist  A complete drug regimen review was completed for this patient to identify any potential clinically significant medication issues.  Clinically significant medication issues were identified:  Yes   Type of Medication Issue Identified Description of Issue Plan Plan Accepted by Provider? (Yes / No)  Drug Interaction(s) (clinically significant)      Duplicate Therapy      Allergy      No Medication Administration End Date  Ortho Team recom. VTE prophylaxis with lovenox for 21 days (OR on 4/19- stop date should be 5/10).  No stop date for medication when patient was transferred to Rehab. Informed [name] about ortho team's recom. for duration for LMWH. Pam stated she will d/w ortho team re: treatment duration for LMWH Yes  Incorrect Dose      Additional Drug Therapy Needed      Other        Name of provider notified for issues identified: [name]  Provider Method of Notification: Telephone   Pharmacist comments: n/a  Time spent performing this drug regimen review (minutes):  10    Cameron Velasquez 02/27/2020 2:58 PM

## 2020-02-27 NOTE — Plan of Care (Signed)
  Problem: RH Balance Goal: LTG Patient will maintain dynamic standing with ADLs (OT) Description: LTG:  Patient will maintain dynamic standing balance with assist during activities of daily living (OT)  Flowsheets (Taken 02/27/2020 1603) LTG: Pt will maintain dynamic standing balance during ADLs with: Independent with assistive device   Problem: Sit to Stand Goal: LTG:  Patient will perform sit to stand in prep for activites of daily living with assistance level (OT) Description: LTG:  Patient will perform sit to stand in prep for activites of daily living with assistance level (OT) Flowsheets (Taken 02/27/2020 1603) LTG: PT will perform sit to stand in prep for activites of daily living with assistance level: Independent with assistive device   Problem: RH Grooming Goal: LTG Patient will perform grooming w/assist,cues/equip (OT) Description: LTG: Patient will perform grooming with assist, with/without cues using equipment (OT) Flowsheets (Taken 02/27/2020 1603) LTG: Pt will perform grooming with assistance level of: Independent with assistive device    Problem: RH Bathing Goal: LTG Patient will bathe all body parts with assist levels (OT) Description: LTG: Patient will bathe all body parts with assist levels (OT) Flowsheets (Taken 02/27/2020 1603) LTG: Pt will perform bathing with assistance level/cueing: Set up assist    Problem: RH Dressing Goal: LTG Patient will perform upper body dressing (OT) Description: LTG Patient will perform upper body dressing with assist, with/without cues (OT). Flowsheets (Taken 02/27/2020 1603) LTG: Pt will perform upper body dressing with assistance level of: Independent with assistive device Goal: LTG Patient will perform lower body dressing w/assist (OT) Description: LTG: Patient will perform lower body dressing with assist, with/without cues in positioning using equipment (OT) Flowsheets (Taken 02/27/2020 1603) LTG: Pt will perform lower body dressing  with assistance level of: Independent with assistive device   Problem: RH Toileting Goal: LTG Patient will perform toileting task (3/3 steps) with assistance level (OT) Description: LTG: Patient will perform toileting task (3/3 steps) with assistance level (OT)  Flowsheets (Taken 02/27/2020 1603) LTG: Pt will perform toileting task (3/3 steps) with assistance level: Independent with assistive device   Problem: RH Toilet Transfers Goal: LTG Patient will perform toilet transfers w/assist (OT) Description: LTG: Patient will perform toilet transfers with assist, with/without cues using equipment (OT) Flowsheets (Taken 02/27/2020 1603) LTG: Pt will perform toilet transfers with assistance level of: Independent with assistive device

## 2020-02-27 NOTE — Care Management (Signed)
Patient Details  Name: Cameron Velasquez MRN: [id number] Date of Birth: [birth year]  Today's Date: 02/27/2020  Problem List:  Patient Active Problem List   Diagnosis Date Noted  . Closed fracture of left tibial plateau 02/26/2020  . Left tibial fracture 02/16/2020  . Leukocytosis 02/16/2020  . Essential hypertension 02/16/2020  . Diabetes mellitus type 2, controlled ([place]) 02/16/2020  . Fall from ladder 02/16/2020   Past Medical History:  Past Medical History:  Diagnosis Date  . Diabetes mellitus without complication ([place])   . Hypertension    Past Surgical History:  Past Surgical History:  Procedure Laterality Date  . ARTHROCENTESIS INJECTION Left 02/16/2020   Procedure: Arthrocentesis Injection;  Surgeon: Altamese [place], MD;  Location: [facility];  Service: Orthopedics;  Laterality: Left;  . CLOSED REDUCTION TIBIA Left 02/16/2020   Procedure: Closed Reduction Tibia;  Surgeon: Altamese [place], MD;  Location: [facility];  Service: Orthopedics;  Laterality: Left;  . EXTERNAL FIXATION LEG Left 02/16/2020   Procedure: EXTERNAL FIXATION LEG;  Surgeon: Altamese [place], MD;  Location: [facility];  Service: Orthopedics;  Laterality: Left;  . EXTERNAL FIXATION REMOVAL Left 02/23/2020   Procedure: REMOVAL EXTERNAL FIXATION LEG;  Surgeon: Altamese [place], MD;  Location: [facility];  Service: Orthopedics;  Laterality: Left;  . I & D EXTREMITY Bilateral 02/16/2020   Procedure: IRRIGATION AND DEBRIDEMENT OPEN FRACTURE EXTREMITY;  Surgeon: Altamese [place], MD;  Location: [facility];  Service: Orthopedics;  Laterality: Bilateral;  . ORIF TIBIA PLATEAU Left 02/23/2020   Procedure: OPEN REDUCTION INTERNAL FIXATION (ORIF) TIBIAL PLATEAU;  Surgeon: Altamese [place], MD;  Location: [facility];  Service: Orthopedics;  Laterality: Left;   Social History:  reports that he has quit smoking. He has never used smokeless tobacco. No history on file for alcohol and drug.  Family / Support Systems Marital Status: Married Patient Roles: Spouse,  Parent Spouse/Significant Other: Psychologist, sport and exercise Children: Son, daughter in Sports coach and 2 daughters Anticipated Caregiver: Daughter during the day/son at night and daughter in law prn Ability/Limitations of Caregiver: None Caregiver Availability: 24/7  Social History Preferred language: English Religion:  Read: Yes Write: Yes Employment Status: Employed Name of Employer: [employer] Return to Work Plans: Working remotely and hopes to be able to manage computer access at discharge and continue to be involved with work after 4 weeks of pal and short term disability   Abuse/Neglect Abuse/Neglect Assessment Can Be Completed: Yes Physical Abuse: Denies Verbal Abuse: Denies Sexual Abuse: Denies Exploitation of patient/patient's resources: Denies Self-Neglect: Denies  Emotional Status Pt's affect, behavior and adjustment status: Normal affect, Type A behavior and worried he now has a need for help instead of being the person to care for his wife  Patient / Family Perceptions, Expectations & Goals Pt/Family understanding of illness & functional limitations: Patient has a good understanding of current health issues and functional limitations Premorbid pt/family roles/activities: Independent prior to admission and taking care of his wife Anticipated changes in roles/activities/participation: May need children to assist him for 6-8 weeks due to NWB status Pt/family expectations/goals: Patient would like to be independent as possible with self care at discharge  [employer] available at discharge: Daughter and son/daughter in law will provide transportation as needed Resource referrals recommended: Neuropsychology  Discharge Planning Living Arrangements: Spouse/significant other Support Systems: Children Type of Residence: Private residence Insurance Resources: [employer] Financial Resources: Employment Financial Screen Referred: No Money Management: Patient Does the patient have  any problems obtaining your medications?: No Home Management: Patient managed the home; he plans  to hire maid; daughters and son will assist with home management at discharge Patient/Family Preliminary Plans: Patient plans to hire a maid and handyman to manage home at discharge [place] Barriers to Discharge: Decreased caregiver support, Home environment access/layout Sw Barriers to Discharge Comments: 1 level- 4 step entry to home Social Work Anticipated Follow Up Needs: HH/OP DC Planning Additional Notes/Comments: Patient has toilet seat risers, tub/shower bench, grab bars in bathroom and access to w/c, RW and Expected length of stay: ELOS 8-10 days  Clinical Impression Pleasant gentleman frustrated with his current situation as he is not accustomed to being cared for rather being a caregiver for his wife. Appears very organized and methodical in his life and this has created some stress for him. He notes daughter from [place] plans to come in for 10 days and then another daughter from [place] will come in to assist. His son is local but works 12 hour dayshift and his daughter in law works 12 hour nightshift. Wife has medical issues and was being cared for by the patient at admission. The patient is very motivated to go through therapy so he can get back to his usual routine.  Cameron Velasquez 02/27/2020, 2:36 PM

## 2020-02-27 NOTE — Progress Notes (Addendum)
[facility] PHYSICAL MEDICINE & REHABILITATION PROGRESS NOTE   Subjective/Complaints:  No issues overnite   Rib pain with deep inspiration RIght side   ROS- neg CP, SOB, N/V/D   Objective:   No results found. Recent Labs    02/26/20 0319 02/27/20 0621  WBC 11.9* 12.1*  HGB 8.5* 8.5*  HCT 26.7* 26.3*  PLT 305 310   Recent Labs    02/26/20 0319 02/27/20 0621  NA 140 139  K 3.7 3.5  CL 108 107  CO2 25 25  GLUCOSE 187* 187*  BUN 16 14  CREATININE 0.94 0.72  CALCIUM 8.6* 8.6*    Intake/Output Summary (Last 24 hours) at 02/27/2020 0817 Last data filed at 02/27/2020 0510 Gross per 24 hour  Intake 420 ml  Output 400 ml  Net 20 ml     Physical Exam: Vital Signs Blood pressure 129/76, pulse 76, temperature 98.1 F (36.7 C), temperature source Oral, resp. rate 18, height 5\' 11"  (1.803 m), weight 129.5 kg, SpO2 100 %.   General: No acute distress Mood and affect are appropriate Heart: Regular rate and rhythm no rubs murmurs or extra sounds Lungs: Clear to auscultation, breathing unlabored, no rales or wheezes Abdomen: Positive bowel sounds, soft nontender to palpation, nondistended Extremities: No clubbing, cyanosis, or edema Skin: No evidence of breakdown, no evidence of rash Neurologic: Cranial nerves II through XII intact, motor strength is 5/5 in bilateral deltoid, bicep, tricep, grip, Left knee orthosis fixed in ext , able to wiggle toes, RLE 4+/5 Sensory exam normal sensation to light touch  In toes   Musculoskeletal: Full range of motion in all 4 extremities. No joint swelling   Assessment/Plan: 1. Functional deficits secondary to polytrauma which require 3+ hours per day of interdisciplinary therapy in a comprehensive inpatient rehab setting.  Physiatrist is providing close team supervision and 24 hour management of active medical problems listed below.  Physiatrist and rehab team continue to assess barriers to discharge/monitor patient progress toward  functional and medical goals  Care Tool:  Bathing              Bathing assist       Upper Body Dressing/Undressing Upper body dressing        Upper body assist      Lower Body Dressing/Undressing Lower body dressing            Lower body assist       Toileting Toileting    Toileting assist Assist for toileting: Maximal Assistance - Patient 25 - 49%     Transfers Chair/bed transfer  Transfers assist           Locomotion Ambulation   Ambulation assist              Walk 10 feet activity   Assist           Walk 50 feet activity   Assist           Walk 150 feet activity   Assist           Walk 10 feet on uneven surface  activity   Assist           Wheelchair     Assist               Wheelchair 50 feet with 2 turns activity    Assist            Wheelchair 150 feet activity     Assist  Blood pressure 129/76, pulse 76, temperature 98.1 F (36.7 C), temperature source Oral, resp. rate 18, height 5\' 11"  (1.803 m), weight 129.5 kg, SpO2 100 %.  Medical Problem List and Plan: 1.  Impaired function, ADLs, and mobility secondary to polytrauma- falling from 15 ft ladder- L tibial plateau fx- s/pp ex-fix then closed reduction- is NWB on LLE- laceration RLE- WBAT; and s/p chest tube for tension pneumothorax. Needs to continue wound VAC for LLE for now.              -patient may  Shower once VAC is removed             -ELOS/Goals: 18-12 days- mod I/supervision CIR PT, OT evals today  2.  Antithrombotics: -DVT/anticoagulation:  Pharmaceutical: Lovenox             -antiplatelet therapy: N/a 3. Pain Management: Vicodin prn effective.- alternating with muscle relaxant- will continue  4. Mood: LCSW to follow for evaluation and support.              -antipsychotic agents: N/A 5. Neuropsych: This patient is capable of making decisions on his own behalf. 6. Skin/Wound Care: Keep wound clean  and dry--monitor for healing. Wound VAC on left tibia to stay in place till next week--change out to prevena.  7. Fluids/Electrolytes/Nutrition: Monitor I/O. Appetite has been good. Check lytes in am.. 8. T2DM: Will continue to monitor BS ac/hs and continue SSI. Resume metformin --family to bring in Trulicity--weekly on Sun. Will resume amaryl as indicated. CBG (last 3)  Recent Labs    02/26/20 1626 02/26/20 2055 02/27/20 0633  GLUCAP 256* 211* 181*  uncontrolled  9. HTN: Monitor BP tid--continue Lisinopril daily. Titrate for better control  Vitals:   02/27/20 0503 02/27/20 0749  BP: 131/81 129/76  Pulse: 74 76  Resp: 18   Temp: 98.1 F (36.7 C)   SpO2: 100%    10. Leucocytosis: Monitor for signs of infection. Recheck CBC in am. Patient continues to use ICS q1 hours while awake.  11. ABLA: Stable. Add iron supplement. Hgb still 8.5  12. Right PTX: Resolved. Continue IS. CT has been removed     LOS: 1 days A FACE TO FACE EVALUATION WAS PERFORMED  02/29/20 02/27/2020, 8:17 AM

## 2020-02-27 NOTE — Care Management (Signed)
[facility] Individual Statement of Services  Patient Name:  Cameron Velasquez  Date:  02/27/2020  Welcome to the [facility].  Our goal is to provide you with an individualized program based on your diagnosis and situation, designed to meet your specific needs.  With this comprehensive rehabilitation program, you will be expected to participate in at least 3 hours of rehabilitation therapies Monday-Friday, with modified therapy programming on the weekends.  Your rehabilitation program will include the following services:  Physical Therapy (PT), Occupational Therapy (OT), 24 hour per day rehabilitation nursing, Therapeutic Recreation (TR), Neuropsychology, Case Management (Social Worker), Rehabilitation Medicine, Nutrition Services and Pharmacy Services  Weekly team conferences will be held on Wednesdays to discuss your progress.  Your Social Automotive engineer will talk with you frequently to get your input and to update you on team discussions.  Team conferences with you and your family in attendance may also be held.  Expected length of stay: 8-12 days  Overall anticipated outcome: Modified Independent - Minimal assistance  Depending on your progress and recovery, your program may change. Your Social Automotive engineer will coordinate services and will keep you informed of any changes. Your Social Worker's/Care Manager's name and contact numbers are listed  below.  The following services may also be recommended but are not provided by the Inpatient Rehabilitation [facility]:    Home Health Rehabilitation Services  Outpatient Rehabilitation Services   Arrangements will be made to provide these services after discharge if needed.  Arrangements include referral to agencies that provide these services.  Your insurance has been verified to be:  Medicare Your primary doctor is:  Selena Batten, FNP  Pertinent information will be shared with your  doctor and your insurance company.  Care Manger/Social Worker: Chana Bode, [phone]) [phone] or ([phone]  Information discussed with and copy given to patient by: Pamelia Hoit, 02/27/2020, 1:45 PM

## 2020-02-27 NOTE — Evaluation (Signed)
Occupational Therapy Assessment and Plan  Patient Details  Name: Cameron Velasquez MRN: [id number] Date of Birth: November 09, 1955  OT Diagnosis: acute pain, muscle weakness (generalized), pain in joint and swelling of limb Rehab Potential: Rehab Potential (ACUTE ONLY): Excellent ELOS: 10-12 days   Today's Date: 02/27/2020 OT Individual Time: 4827-0786 and [id number] OT Individual Time Calculation (min): 72 min  And 72 min  Problem List:  Patient Active Problem List   Diagnosis Date Noted  . Closed fracture of left tibial plateau 02/26/2020  . Left tibial fracture 02/16/2020  . Leukocytosis 02/16/2020  . Essential hypertension 02/16/2020  . Diabetes mellitus type 2, controlled ([place]) 02/16/2020  . Fall from ladder 02/16/2020    Past Medical History:  Past Medical History:  Diagnosis Date  . Diabetes mellitus without complication ([place])   . Hypertension    Past Surgical History:  Past Surgical History:  Procedure Laterality Date  . ARTHROCENTESIS INJECTION Left 02/16/2020   Procedure: Arthrocentesis Injection;  Surgeon: Altamese [place], MD;  Location: [place];  Service: Orthopedics;  Laterality: Left;  . CLOSED REDUCTION TIBIA Left 02/16/2020   Procedure: Closed Reduction Tibia;  Surgeon: Altamese [place], MD;  Location: [place];  Service: Orthopedics;  Laterality: Left;  . EXTERNAL FIXATION LEG Left 02/16/2020   Procedure: EXTERNAL FIXATION LEG;  Surgeon: Altamese [place], MD;  Location: [place];  Service: Orthopedics;  Laterality: Left;  . EXTERNAL FIXATION REMOVAL Left 02/23/2020   Procedure: REMOVAL EXTERNAL FIXATION LEG;  Surgeon: Altamese [place], MD;  Location: [place];  Service: Orthopedics;  Laterality: Left;  . I & D EXTREMITY Bilateral 02/16/2020   Procedure: IRRIGATION AND DEBRIDEMENT OPEN FRACTURE EXTREMITY;  Surgeon: Altamese [place], MD;  Location: [place];  Service: Orthopedics;  Laterality: Bilateral;  . ORIF TIBIA PLATEAU Left 02/23/2020   Procedure: OPEN REDUCTION INTERNAL FIXATION (ORIF)  TIBIAL PLATEAU;  Surgeon: Altamese [place], MD;  Location: [place];  Service: Orthopedics;  Laterality: Left;    Assessment & Plan Clinical Impression: Cameron Velasquez is a 64 year old male with history of HTN, T2DM, GERD, who was admitted on 02/16/20 after falling 15' of a ladder and hit the [facility] unit on the way down. He had reports of right shoulder and bilateral leg pain. Work up done revealing left bicondylar tibial plateau fracture, open left calf wound, open left comminuted proximal tiba and acute non displaced fibular head Fx,  Large left lipohemarthrosis, RLE laceration, right 7th rib fracture with small PTX. He was taken to OR for I & D of BLE with close reduction of left tibia, aspiration of left knee hemarthrosis, placement of external fixator LLE, I & D with closure of RLE traumatic wound with placement of prevena VAC.  Follow up CXR showed large right PTX on 4/13 and chest tube placed by Dr. Donne Hazel with minimal bloody output. This was removed on 4/16 and follow up CXR stable.   He was taken back to OR on 4/19 for removal of external fixator with ORIF --no ROM X 2 week with hinged brace locked out then gentle ROM. No active extension X 6 weeks and NWB LLE. Prevenal removed in OR and RLE wound healing well. Hospital course significant for acute renal failure which has resolved, poorly controlled BS, ABLA as well as persistent leucocytosis. Respiratory status is stable and activity tolerance is improving. Therapy ongoing---is he able to maintain NWB and able to hop on RLE. CIR recommended due to functional decline in ADLs and mobility.     Patient currently requires Min-Max  with basic self-care skills secondary to muscle weakness and pain, decreased cardiorespiratoy endurance and decreased standing balance and difficulty maintaining precautions.  Prior to hospitalization, patient could complete BADLs with independent .  Patient will benefit from skilled intervention to increase independence with  basic self-care skills prior to discharge home with family support.  Anticipate patient will require intermittent supervision and follow up home health.  OT - End of Session Endurance Deficit: Yes OT Assessment Rehab Potential (ACUTE ONLY): Excellent OT Barriers to Discharge: Home environment access/layout;Medical stability;Weight bearing restrictions;Wound Care OT Patient demonstrates impairments in the following area(s): Balance;Safety;Edema;Skin Integrity;Endurance;Motor;Pain OT Basic ADL's Functional Problem(s): Grooming;Bathing;Dressing;Toileting OT Advanced ADL's Functional Problem(s): Simple Meal Preparation OT Transfers Functional Problem(s): Toilet;Tub/Shower OT Additional Impairment(s): None OT Plan OT Intensity: Minimum of 1-2 x/day, 45 to 90 minutes OT Frequency: 5 out of 7 days OT Duration/Estimated Length of Stay: 10-12 days OT Treatment/Interventions: Balance/vestibular training;Discharge planning;Pain management;Disease mangement/prevention;Functional mobility training;Patient/family education;Psychosocial support;DME/adaptive equipment instruction;Community reintegration;Self Care/advanced ADL retraining;Therapeutic Activities;Skin care/wound managment;Therapeutic Exercise;UE/LE Strength taining/ROM;Wheelchair propulsion/positioning;Splinting/orthotics OT Self Feeding Anticipated Outcome(s): No goal OT Basic Self-Care Anticipated Outcome(s): Supervision/setup-Mod I OT Toileting Anticipated Outcome(s): Mod I OT Bathroom Transfers Anticipated Outcome(s): Mod I OT Recommendation Recommendations for Other Services: Therapeutic Recreation consult Therapeutic Recreation Interventions: Clinical cytogeneticist;Outing/community reintergration Patient destination: Home Follow Up Recommendations: Home health OT Equipment Recommended: To be determined  Skilled Therapeutic Intervention Skilled OT session completed with focus on initial evaluation, education on OT role/POC,  and establishment of patient-centered goals.   Pt greeted in bed, smiling at sight of therapist and motivated to begin his therapy at CIR. Started with receiving pain medicine from RN. Afterwards pt transitioned to EOB with CGA and HOB elevated, pt able to manage his Lt LE using the hinged knee brace. While sitting up, pt engaged in bathing/dressing tasks at sit<stand level using RW. Pt needed assistance to wash both feet and also for threading LB garments on the Lt side to accommodate wound VAC. Pt able to stand with CGA using device when assisting during perihygiene and LB dressing tasks. Pt ultimately needed help from therapist due to fatigue and pain at this time. After a prolonged rest break, pt completed functional transfer to elevated toilet in bathroom using hop to gait pattern and RW for support with CGA. Pt once again needed a long seated rest break due to pain and fatigue. Throughout OOB activity, pt needed his Lt LE elevated on stack of folded towels for pain mgt. At end of session pt hopped to recliner at bedside. Left him with all needs within reach, LEs elevated, ice pack for pain mgt.   Throughout functional standing, pt kept his Lt LE off of the floor for NWB with min cuing.    2nd Session 1:1 tx (72 min) Pt greeted in his recliner after lunch. Requesting to start session by using the restroom. Ambulatory transfer to toilet using RW and hop to gait pattern for Lt LE NWB completed with CGA. Pt needed Min A for clothing mgt and min vcs for keeping Lt foot off of floor when standing. Pt with continent B+B void using the urinal as needed. Max A for hygiene and Mod-Max A for clothing mgt again due to fatigue and pain. Vcs for keeping his Lt LE off of floor at this time as well. Oral care completed while seated at the sink. He then doffed clothing at sit<stand level with Mod A for lowering underwear and pants. Pt able to assist with unilateral support on the  sink. Afterwards he transferred back to  bed using RW and was repositioned for comfort with Lt LE elevated and extended. Rt LE also elevated for edema mgt. OT demonstrated reacher use to increase his functional independence with dressing tasks. Pt verbalized that he will purchase a reacher for home use and is already starting to d/c plan with family (I.e. purchasing a rental ramp). He was left with all needs within reach and bed alarm set, receiving pain medicine from RN right before departure.   OT Evaluation Precautions/Restrictions  Precautions Precautions: Fall Precaution Comments: LLE wound vac Required Braces or Orthoses: Other Brace Other Brace: hinged knee brace locked in extension Restrictions Weight Bearing Restrictions: Yes RLE Weight Bearing: Weight bearing as tolerated LLE Weight Bearing: Non weight bearing Other Position/Activity Restrictions: No ROM L knee x2 weeks from sx on 4/19; unrestricted ROM RLE Vital Signs Therapy Vitals Temp: 99.1 F (37.3 C) Pulse Rate: 87 Resp: 20 BP: (!) 148/78 Patient Position (if appropriate): Lying Oxygen Therapy SpO2: 100 % O2 Device: Room Air Pain Pain Assessment Pain Scale: 0-10 Pain Score: 6  Pain Type: Acute pain Pain Location: Knee Pain Orientation: Left Pain Intervention(s): Medication (See eMAR) Home Living/Prior [place] expects to be discharged to:: Private residence Living Arrangements: Spouse/significant other Available Help at Discharge: Family, Available 24 hours/day Type of Home: House Home Access: Stairs to enter Technical brewer of Steps: 4 Entrance Stairs-Rails: None Home Layout: One level Bathroom Shower/Tub: Gaffer, Chiropodist: Standard Bathroom Accessibility: Yes Additional Comments: pt's daughters flying in from [place], son and daughter-in-law live 5 min away to assist as needed as well   Lives With: Spouse IADL History Homemaking Responsibilities: Yes(Pt reports  independently managing his household and caretaking for his wife PTA) Type of Occupation: Working remote for [employer] Prior Function Level of Independence: Independent with basic ADLs, Independent with gait, Independent with transfers  Able to Take Stairs?: Yes Driving: Yes ADL ADL Eating: Not assessed Grooming: Setup Where Assessed-Grooming: Sitting at sink Upper Body Bathing: Setup Where Assessed-Upper Body Bathing: Edge of bed Lower Body Bathing: Minimal assistance Where Assessed-Lower Body Bathing: Edge of bed Upper Body Dressing: Setup Where Assessed-Upper Body Dressing: Edge of bed Lower Body Dressing: Moderate assistance Where Assessed-Lower Body Dressing: Edge of bed Toileting: Maximal assistance Where Assessed-Toileting: Glass blower/designer: Therapist, music Method: Ambulating(with RW) Science writer: Raised toilet seat Tub/Shower Transfer: Not assessed Vision Baseline Vision/History: Wears glasses Wears Glasses: At all times Patient Visual Report: No change from baseline Perception  Perception: Within Functional Limits Praxis Praxis: Intact Cognition Overall Cognitive Status: Within Functional Limits for tasks assessed Arousal/Alertness: Awake/alert Orientation Level: Person;Place;Situation Person: Oriented Place: Oriented Situation: Oriented Year: 2021 Month: April Day of Week: Correct Memory: Appears intact Immediate Memory Recall: Sock;Blue;Bed Memory Recall Sock: Without Cue Memory Recall Blue: Without Cue Memory Recall Bed: Without Cue Attention: Sustained Sustained Attention: Appears intact Awareness: Appears intact Problem Solving: Appears intact Safety/Judgment: Appears intact Sensation Coordination Gross Motor Movements are Fluid and Coordinated: Yes Fine Motor Movements are Fluid and Coordinated: Yes Motor  Motor Motor: Other (comment) Motor - Skilled Clinical Observations: generalized weakness and pain from  surgery Mobility    CGA ambulatory toilet transfer using RW with hop to gait pattern  Trunk/Postural Assessment  Cervical Assessment Cervical Assessment: Within Functional Limits Thoracic Assessment Thoracic Assessment: Within Functional Limits Lumbar Assessment Lumbar Assessment: Within Functional Limits Postural Control Postural Control: Within Functional Limits  Balance Balance Balance Assessed: Yes Dynamic  Sitting Balance Dynamic Sitting - Balance Support: No upper extremity supported Dynamic Sitting - Level of Assistance: 5: Stand by assistance(threading Rt LE into pants while sitting EOB) Dynamic Standing Balance Dynamic Standing - Balance Support: During functional activity;Left upper extremity supported Dynamic Standing - Level of Assistance: 4: Min assist Dynamic Standing - Balance Activities: Lateral lean/weight shifting;Forward lean/weight shifting(clothing mgt during toileting) Extremity/Trunk Assessment RUE Assessment RUE Assessment: Within Functional Limits Active Range of Motion (AROM) Comments: WNL LUE Assessment LUE Assessment: Within Functional Limits Active Range of Motion (AROM) Comments: WNL   Refer to Care Plan for Long Term Goals  Recommendations for other services: Therapeutic Recreation  Kitchen group, Stress management and Outing/community reintegration   Discharge Criteria: Patient will be discharged from OT if patient refuses treatment 3 consecutive times without medical reason, if treatment goals not met, if there is a change in medical status, if patient makes no progress towards goals or if patient is discharged from hospital.  The above assessment, treatment plan, treatment alternatives and goals were discussed and mutually agreed upon: by patient  Skeet Simmer 02/27/2020, 3:58 PM

## 2020-02-27 NOTE — Plan of Care (Signed)
  Problem: Consults Goal: RH GENERAL PATIENT EDUCATION Description: See Patient Education module for education specifics. Outcome: Progressing Goal: Skin Care Protocol Initiated - if Braden Score 18 or less Description: If consults are not indicated, leave blank or document N/A Outcome: Progressing   Problem: RH SKIN INTEGRITY Goal: RH STG MAINTAIN SKIN INTEGRITY WITH ASSISTANCE Description: STG Maintain Skin Integrity With min Assistance. Outcome: Progressing   Problem: RH SAFETY Goal: RH STG ADHERE TO SAFETY PRECAUTIONS W/ASSISTANCE/DEVICE Description: STG Adhere to Safety Precautions With cues Assistance/Device. Outcome: Progressing

## 2020-02-27 NOTE — Evaluation (Signed)
Physical Therapy Assessment and Plan  Patient Details  Name: Cameron Velasquez MRN: [id number] Date of Birth: 1956-02-24  PT Diagnosis: Difficulty walking, Muscle weakness and Pain in joint Rehab Potential: Good ELOS: 10-12   Today's Date: 02/27/2020 PT Individual Time: 1010-1105 PT Individual Time Calculation (min): 55 min    Problem List:  Patient Active Problem List   Diagnosis Date Noted  . Closed fracture of left tibial plateau 02/26/2020  . Left tibial fracture 02/16/2020  . Leukocytosis 02/16/2020  . Essential hypertension 02/16/2020  . Diabetes mellitus type 2, controlled ([place]) 02/16/2020  . Fall from ladder 02/16/2020    Past Medical History:  Past Medical History:  Diagnosis Date  . Diabetes mellitus without complication ([place])   . Hypertension    Past Surgical History:  Past Surgical History:  Procedure Laterality Date  . ARTHROCENTESIS INJECTION Left 02/16/2020   Procedure: Arthrocentesis Injection;  Surgeon: Altamese [place], MD;  Location: [place];  Service: Orthopedics;  Laterality: Left;  . CLOSED REDUCTION TIBIA Left 02/16/2020   Procedure: Closed Reduction Tibia;  Surgeon: Altamese Vernon, MD;  Location: [place];  Service: Orthopedics;  Laterality: Left;  . EXTERNAL FIXATION LEG Left 02/16/2020   Procedure: EXTERNAL FIXATION LEG;  Surgeon: Altamese Stephens, MD;  Location: [place];  Service: Orthopedics;  Laterality: Left;  . EXTERNAL FIXATION REMOVAL Left 02/23/2020   Procedure: REMOVAL EXTERNAL FIXATION LEG;  Surgeon: Altamese [place]ber[place], MD;  Location: [place];  Service: Orthopedics;  Laterality: Left;  . I & D EXTREMITY Bilateral 02/16/2020   Procedure: IRRIGATION AND DEBRIDEMENT OPEN FRACTURE EXTREMITY;  Surgeon: Altamese Remer, MD;  Location: [place];  Service: Orthopedics;  Laterality: Bilateral;  . ORIF TIBIA PLATEAU Left 02/23/2020   Procedure: OPEN REDUCTION INTERNAL FIXATION (ORIF) TIBIAL PLATEAU;  Surgeon: Altamese [place], MD;  Location: [place];  Service: Orthopedics;   Laterality: Left;    Assessment & Plan Clinical Impression: Patient is a 64 yo male who fell off a ladder from 15 feet. Post fall, he had immediate lower extremity pain and was admitted to [facility] on 4/12 as a level 2 trauma. Upon admission, pt complained of right shoulder, left leg, and right rib cage pain. Work-up revealed mild displaced right seventh rib fracture with small right basilar pneumothorax, comminuted fracture of the proximal tibia on the left, and a right lower extremity laceration. On 4/12, pt underwent surgery with Dr. Marcelino Scot for closed reduction of left bicondylar tibial plateau fracture, application of spanning external fixator, irrigation and debridement with layered closure of left leg wound, debridement of open fracture, right tibia, aspiration of left knee hemarthrosis, and application of a wound VAC on the right leg for open fracture wound. On 4/13, pt underwent a right pigtail thoracostomy by Dr. Donne Hazel for tension pneumothorax. Chest tube was pulled on 4/16 with no residual pnemothorax. On 4/19, pt returned to OR for removal of external fixation and ORIF of left bicondylar tibial plateau fracture. Pt is to be NWB LLE with no knee ROM x2 weeks with hinged brace.  Patient transferred to CIR on 02/26/2020 .   Patient currently requires mod with mobility secondary to muscle weakness and muscle joint tightness, decreased cardiorespiratoy endurance and decreased standing balance, decreased balance strategies and difficulty maintaining precautions.  Prior to hospitalization, patient was independent  with mobility and lived with Spouse in a House home.  Home access is 4Stairs to enter.  Patient will benefit from skilled PT intervention to maximize safe functional mobility, minimize fall risk and decrease caregiver burden  for planned discharge home with intermittent assist.  Anticipate patient will benefit from follow up [facility] at discharge.  PT - End of Session Activity Tolerance: Tolerates 10  - 20 min activity with multiple rests Endurance Deficit: Yes PT Assessment Rehab Potential (ACUTE/IP ONLY): Good PT Barriers to Discharge: Inaccessible home environment;Decreased caregiver support;Weight PT Patient demonstrates impairments in the following area(s): Balance;Edema;Endurance;Pain;Safety;Skin Integrity PT Transfers Functional Problem(s): Bed Mobility;Bed to Chair;Car;Furniture;Floor PT Locomotion Functional Problem(s): Ambulation;Wheelchair Mobility;Stairs PT Plan PT Intensity: Minimum of 1-2 x/day ,45 to 90 minutes PT Frequency: 5 out of 7 days PT Duration Estimated Length of Stay: 10-12 PT Treatment/Interventions: Ambulation/gait training;Community reintegration;DME/adaptive equipment instruction;Psychosocial support;Stair training;UE/LE Strength taining/ROM;Wheelchair propulsion/positioning;UE/LE Coordination activities;Skin care/wound management;Therapeutic Activities;Pain management;Functional electrical stimulation;Balance/vestibular training;Discharge planning;Cognitive remediation/compensation;Disease management/prevention;Functional mobility training;Splinting/orthotics;Patient/family education;Therapeutic Exercise;Visual/perceptual remediation/compensation PT Transfers Anticipated Outcome(s): Supervision assist with LRAD PT Locomotion Anticipated Outcome(s): Supervsion assist ambulatory for household distance, Mod I WC. PT Recommendation Recommendations for Other Services: Therapeutic Recreation consult Therapeutic Recreation Interventions: Stress management;Outing/community reintergration Follow Up Recommendations: Home health PT Patient destination: Home Equipment Recommended: Wheelchair cushion (measurements);Wheelchair (measurements);Rolling walker with 5" wheels  Skilled Therapeutic Intervention  Pt received sitting in recliner and agreeable to PT. PT instructed patient in PT Evaluation and initiated treatment intervention; see below for results. PT educated  patient in [place], rehab potential, rehab goals, and discharge recommendations. Gait training with RW x 91f as listed below with mod assist. Sit<>stand with min-mod assist throughout treatment form varisous surfaces for proper NWB in the LLE. Pt unable to perform car or stairs due to NWB in the LLE locked in extension. WC mobility 2 x1555fand 12073fith supervision assist and cues for safety in turns. Patient returned to room and performed stand pivot to recliner with mod assist. Pt left sitting in recliner with call bell in reach and all needs met.        PT Evaluation Precautions/Restrictions Precautions Precautions: Fall;Other (comment) Precaution Comments: LLE wound vac Required Braces or Orthoses: Other Brace Other Brace: Bledsoe brace locked in extension Restrictions Weight Bearing Restrictions: Yes LLE Weight Bearing: Non weight bearing Other Position/Activity Restrictions: No ROM L knee; unrestricted ROM RLE General   Vital SignsTherapy Vitals Pulse Rate: 76 BP: 129/76 Patient Position (if appropriate): Sitting Pain Pain Assessment Pain Scale: 0-10 Pain Score: 7  Pain Type: Surgical pain Pain Location: Leg(rib cage) Pain Orientation: Right;Left Pain Descriptors / Indicators: Throbbing Pain Frequency: Constant Pain Onset: On-going Patients Stated Pain Goal: 6 Pain Intervention(s): Medication (See eMAR) Home Living/Prior Functioning Home Living Available Help at Discharge: Family Type of Home: House Home Access: Stairs to enter EntTechnical brewer Steps: 4 Entrance Stairs-Rails: None Home Layout: One level Bathroom Shower/Tub: WalMultimedia programmertandard Bathroom Accessibility: Yes Additional Comments: pt's daughters coming in from out of town, son and daughter-in-law live 5 min away  Lives With: Spouse Prior Function Level of Independence: Independent with basic ADLs  Able to Take Stairs?: Yes Driving: Yes Vocation: Full time  employment 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 FEMCoca Colasion/Perception  Praxis Praxis: Intact  Cognition Overall Cognitive Status: Within Functional Limits for tasks assessed Orientation Level: Oriented X4 Safety/Judgment: Appears intact Sensation Sensation Light Touch: Appears Intact Proprioception: Appears Intact Coordination Gross Motor Movements are Fluid and Coordinated: Yes Fine Motor Movements are Fluid and Coordinated: Yes Heel Shin Test: unable to perform from Bledsoe brace Motor  Motor Motor: Within Functional Limits Motor - Skilled Clinical Observations: generalized weakness and pain from surgery  Mobility Bed Mobility  Bed Mobility: Supine to Sit;Sit to Supine Supine to Sit: Moderate Assistance - Patient 50-74% Sit to Supine: Moderate Assistance - Patient 50-74% Transfers Transfers: Sit to Stand;Stand Pivot Transfers Sit to Stand: Moderate Assistance - Patient 50-74% Stand Pivot Transfers: Moderate Assistance - Patient 50 - 74% Transfer (Assistive device): Other (Comment) Locomotion  Gait Ambulation: Yes Gait Assistance: Moderate Assistance - Patient 50-74% Gait Distance (Feet): 40 Feet Assistive device: Rolling walker Gait Gait: Yes Gait Pattern: Impaired Gait Pattern: Step-to pattern;Trunk flexed Stairs / Additional Locomotion Stairs: No Wheelchair Mobility Wheelchair Mobility: Yes Wheelchair Assistance: Chartered loss adjuster: Both upper extremities Distance: 150  Trunk/Postural Assessment  Cervical Assessment Cervical Assessment: Within Functional Limits Thoracic Assessment Thoracic Assessment: Within Functional Limits Lumbar Assessment Lumbar Assessment: Within Functional Limits Postural Control Postural Control: Within Functional Limits  Balance Balance Balance Assessed: Yes Static Sitting Balance Static Sitting - Level of Assistance: 7: Independent Dynamic Sitting Balance Dynamic Sitting -  Balance Support: No upper extremity supported Dynamic Sitting - Level of Assistance: 5: Stand by assistance Static Standing Balance Static Standing - Balance Support: Bilateral upper extremity supported Static Standing - Level of Assistance: 4: Min assist Dynamic Standing Balance Dynamic Standing - Balance Support: Bilateral upper extremity supported;During functional activity Dynamic Standing - Level of Assistance: 3: Mod assist Extremity Assessment      RLE Assessment RLE Assessment: Within Functional Limits General Strength Comments: grossly 5/5 LLE Assessment LLE Assessment: Exceptions to [facility] General Strength Comments: KI with bledsoe brace. grossly 3+/5 hip and ankle    Refer to Care Plan for Long Term Goals  Recommendations for other services: Therapeutic Recreation  Stress management and Outing/community reintegration  Discharge Criteria: Patient will be discharged from PT if patient refuses treatment 3 consecutive times without medical reason, if treatment goals not met, if there is a change in medical status, if patient makes no progress towards goals or if patient is discharged from hospital.  The above assessment, treatment plan, treatment alternatives and goals were discussed and mutually agreed upon: by patient  Lorie Phenix 02/27/2020, 11:12 AM

## 2020-02-28 ENCOUNTER — Inpatient Hospital Stay (HOSPITAL_COMMUNITY): Payer: Medicare Other | Admitting: Physical Therapy

## 2020-02-28 ENCOUNTER — Inpatient Hospital Stay (HOSPITAL_COMMUNITY): Payer: Medicare Other | Admitting: Occupational Therapy

## 2020-02-28 LAB — GLUCOSE, CAPILLARY
Glucose-Capillary: 144 mg/dL — ABNORMAL HIGH (ref 70–99)
Glucose-Capillary: 136 mg/dL — ABNORMAL HIGH (ref 70–99)
Glucose-Capillary: 127 mg/dL — ABNORMAL HIGH (ref 70–99)

## 2020-02-28 NOTE — IPOC Note (Signed)
Overall Plan of Care [facility]) Patient Details Name: Maurice Fotheringham MRN: [id number] DOB: 1955-12-19  Admitting Diagnosis: Closed fracture of left tibial plateau  Hospital Problems: Principal Problem:   Closed fracture of left tibial plateau Active Problems:   Essential hypertension   Diabetes mellitus type 2, controlled ([place])   Fall from ladder     Functional Problem List: Nursing Edema, Endurance, Medication Management, Motor, Pain, Safety, Skin Integrity  PT Balance, Edema, Endurance, Pain, Safety, Skin Integrity  OT Balance, Safety, Edema, Skin Integrity, Endurance, Motor, Pain  SLP    TR         Basic ADL's: OT Grooming, Bathing, Dressing, Toileting     Advanced  ADL's: OT Simple Meal Preparation     Transfers: PT Bed Mobility, Bed to Chair, Car, Furniture, Floor  OT Toilet, Metallurgist: PT Ambulation, Emergency planning/management officer, Stairs     Additional Impairments: OT None  SLP        TR      Anticipated Outcomes Item Anticipated Outcome  Self Feeding No goal  Swallowing      Basic self-care  Supervision/setup-Mod I  Toileting  Mod I   Bathroom Transfers Mod I  Bowel/Bladder  Mod I assist  Transfers  Supervision assist with LRAD  Locomotion  Supervsion assist ambulatory for household distance, Mod I WC.  Communication     Cognition     Pain  < 4  Safety/Judgment  Mod I assist   Therapy Plan: PT Intensity: Minimum of 1-2 x/day ,45 to 90 minutes PT Frequency: 5 out of 7 days PT Duration Estimated Length of Stay: 10-12 OT Intensity: Minimum of 1-2 x/day, 45 to 90 minutes OT Frequency: 5 out of 7 days OT Duration/Estimated Length of Stay: 10-12 days     Due to the current state of emergency, patients may not be receiving their 3-hours of Medicare-mandated therapy.   Team Interventions: Nursing Interventions Patient/Family Education, Skin Care/Wound Management, Discharge Planning, Pain Management, Medication Management  PT  interventions Ambulation/gait training, Community reintegration, DME/adaptive equipment instruction, Psychosocial support, Stair training, UE/LE Strength taining/ROM, Wheelchair propulsion/positioning, UE/LE Coordination activities, Skin care/wound management, Therapeutic Activities, Pain management, Functional electrical stimulation, Training and development officer, Discharge planning, Cognitive remediation/compensation, Disease management/prevention, Functional mobility training, Splinting/orthotics, Patient/family education, Therapeutic Exercise, Visual/perceptual remediation/compensation  OT Interventions Balance/vestibular training, Discharge planning, Pain management, Disease mangement/prevention, Functional mobility training, Patient/family education, Psychosocial support, DME/adaptive equipment instruction, Community reintegration, Self Care/advanced ADL retraining, Therapeutic Activities, Skin care/wound managment, Therapeutic Exercise, UE/LE Strength taining/ROM, Wheelchair propulsion/positioning, Splinting/orthotics  SLP Interventions    TR Interventions    SW/CM Interventions Discharge Planning, Disease Management/Prevention, Psychosocial Support, Patient/Family Education   Barriers to Discharge MD  Medical stability  Nursing Weight bearing restrictions, Wound Care    PT Inaccessible home environment, Decreased caregiver support, Weight    OT Home environment access/layout, Medical stability, Weight bearing restrictions, Wound Care    SLP      SW Decreased caregiver support, Home environment access/layout 1 level- 4 step entry to home   Team Discharge Planning: Destination: PT-Home ,OT- Home , SLP-  Projected Follow-up: PT-Home health PT, OT-  Home health OT, SLP-  Projected Equipment Needs: PT-Wheelchair cushion (measurements), Wheelchair (measurements), Rolling walker with 5" wheels, OT- To be determined, SLP-  Equipment Details: PT- , OT-  Patient/family involved in discharge  planning: PT- Patient,  OT-Patient, SLP-   MD ELOS: 10-12d Medical Rehab Prognosis:  Good Assessment:  64 year old male with history of HTN, T2DM,  GERD, who was admitted on 02/16/20 after falling 15' of a ladder and hit the [facility] unit on the way down. He had reports of right shoulder and bilateral leg pain. Work up done revealing left bicondylar tibial plateau fracture, open left calf wound, open left comminuted proximal tiba and acute non displaced fibular head Fx,  Large left lipohemarthrosis, RLE laceration, right 7th rib fracture with small PTX. He was taken to OR for I & D of BLE with close reduction of left tibia, aspiration of left knee hemarthrosis, placement of external fixator LLE, I & D with closure of RLE traumatic wound with placement of prevena VAC.  Follow up CXR showed large right PTX on 4/13 and chest tube placed by Dr. Dwain Sarna with minimal bloody output. This was removed on 4/16 and follow up CXR stable.   He was taken back to OR on 4/19 for removal of external fixator with ORIF --no ROM X 2 week with hinged brace locked out then gentle ROM. No active extension X 6 weeks and NWB LLE. Prevenal removed in OR and RLE wound healing well. Hospital course significant for acute renal failure which has resolved, poorly controlled BS, ABLA as well as persistent leucocytosis. Respiratory status is stable and activity tolerance is improving. Therapy ongoing---is he able to maintain NWB and able to hop on RLE. CIR recommended due to functional decline in ADLs and mobility.     Now requiring 24/7 Rehab RN,MD, as well as CIR level PT, OT and SLP.  Treatment team will focus on ADLs and mobility with goals set at modI/Sup See Team Conference Notes for weekly updates to the plan of care

## 2020-02-28 NOTE — Progress Notes (Signed)
Occupational Therapy Session Note  Patient Details  Name: Cameron Velasquez MRN: [id number] Date of Birth: 1956/07/01  Today's Date: 02/28/2020 OT Individual Time: 1300-1414 OT Individual Time Calculation (min): 74 min   Short Term Goals: Week 1:  OT Short Term Goal 1 (Week 1): Pt will complete toileting with Mod A OT Short Term Goal 2 (Week 1): Pt will complete LB dressing with supervision for dynamic standing balance OT Short Term Goal 3 (Week 1): Pt will don footwear with supervision assist with AE as needed  Skilled Therapeutic Interventions/Progress Updates:    Pt greeted in the recliner after lunch, RN present administering medication. Pt requested to use the bathroom so OT first retrieved a drop arm BSC to place over his toilet that had an elongated seat. CGA for ambulatory transfer to toilet using RW with hop to gait pattern for Lt LE NWB. CGA while pt fully lowered clothing himself. Pt with continent B+B void and was able to thoroughly complete perihygiene using lateral lean technique and dropping bar of BSC! We celebrated!! Pt elevated clothing afterwards with the same balance assistance while keeping Lt foot off of floor. Note that he needed prolonged seated rest breaks in between stands. Pt then returned to the recliner where OT set him up with hand sanitizer. Discussed AE and DME for home. Pt trialed using the shoe horn today to doff and don his Rt shoe. He reported he has already bought two reachers for home, also bought a shoe horn and drop arm BSC on [employer] during session. He stated that he has already purchased a ramp for home use as well. Per pt, he considers himself "a planner" and is very motivated to figure out how he needs to safely set up his home before d/c. OT provided him with a reacher bag and LH sponge. At end of session pt remained in the recliner with all needs within reach.   Therapy Documentation Precautions:  Precautions Precautions: Fall Precaution Comments: LLE  wound vac Required Braces or Orthoses: Other Brace Other Brace: hinged knee brace locked in extension Restrictions Weight Bearing Restrictions: Yes RLE Weight Bearing: Weight bearing as tolerated LLE Weight Bearing: Non weight bearing Other Position/Activity Restrictions: No ROM L knee x2 weeks from sx on 4/19; unrestricted ROM RLE Vital Signs: Therapy Vitals Temp: 98.1 F (36.7 C) Pulse Rate: 88 Resp: 18 BP: 128/75 Patient Position (if appropriate): Sitting Oxygen Therapy SpO2: 100 % O2 Device: Room Air Pain: ice provided for the Lt LE at end of session for pain mgt   ADL: ADL Eating: Not assessed Grooming: Setup Where Assessed-Grooming: Sitting at sink Upper Body Bathing: Setup Where Assessed-Upper Body Bathing: Edge of bed Lower Body Bathing: Minimal assistance Where Assessed-Lower Body Bathing: Edge of bed Upper Body Dressing: Setup Where Assessed-Upper Body Dressing: Edge of bed Lower Body Dressing: Moderate assistance Where Assessed-Lower Body Dressing: Edge of bed Toileting: Maximal assistance Where Assessed-Toileting: Teacher, adult education: Furniture conservator/restorer Method: Ambulating(with RW) Acupuncturist: Raised toilet seat Tub/Shower Transfer: Not assessed     Therapy/Group: Individual Therapy  Mathhew Buysse A Diandre Merica 02/28/2020, 4:06 PM

## 2020-02-28 NOTE — Progress Notes (Signed)
Physical Therapy Session Note  Patient Details  Name: Cameron Velasquez MRN: [id number] Date of Birth: [birth year]  Today's Date: 02/28/2020 PT Individual Time: 0910-1010 1520-1620 PT Individual Time Calculation (min): 60 min 60 min   Short Term Goals: Week 1:  PT Short Term Goal 1 (Week 1): Pt will consistently transfer to [facility] with min assist PT Short Term Goal 2 (Week 1): Pt will ambulate 64f with min assist PT Short Term Goal 3 (Week 1): Pt will propell WC through rehab unit x 1543fwithout assist from PT. PT Short Term Goal 4 (Week 1): Bed mobility with min assist and LRAD Week 2:     Skilled Therapeutic Interventions/Progress Updates:  Session 1  Pt received sitting in WC and agreeable to PT. WC mobility with supervision assist throughout unit [id number] for safety through narrow doors. UBE x 10 min  Random setting with supervision assist and cues for sustained speed at 45-50 RPM. Gait training with RW x 25ft65fh min assist for safety.  Sit<>stand and stand piovt transfers completed x 4 throughout treatment with min assist. Patient returned to room and performed stand pivot to recliner with stand pivot transfer to WC. PMontierleft sitting in recliner with call bell in reach and all needs met.     Session 2.   Pt received sitting recliner and agreeable to PT. Stand pivot [facility] for safety and LLE management. WC mobility through rehab unit with supervision assist dynamic WC propulsion to weave through 4 cones x 3  + 4 cones backwards with supervision assist for awareness of WC parts and decreased turning radius in reverse. Dynamic standing balance while engaged in fine motor task of Wii bowling; pt able to maintain standing up to 7 frames prior to requested rest break for knee pain and UE fatigue. 1-2 UE support with min-CGA from PT throughout. Gait training to weave around 4 cones with min assist and min cues for AD management in turns. Pt returned to room and  performed stand pivot transfer to bed with min assist and RW. Pt perform dressing with . Sit>supine completed with min assist for LLE control and left supine in bed with call bell in reach and all needs met.         Therapy Documentation Precautions:  Precautions Precautions: Fall Precaution Comments: LLE wound vac Required Braces or Orthoses: Other Brace Other Brace: hinged knee brace locked in extension Restrictions Weight Bearing Restrictions: Yes RLE Weight Bearing: Weight bearing as tolerated LLE Weight Bearing: Non weight bearing Other Position/Activity Restrictions: No ROM L knee x2 weeks from sx on 4/19; unrestricted ROM RLE    Vital Signs: Therapy Vitals BP: (!) 141/75 Pain: Denies at rest.    Therapy/Group: Individual Therapy  AustiLorie Phenix/2021, 10:13 AM

## 2020-02-28 NOTE — Plan of Care (Signed)
  Problem: Consults Goal: RH GENERAL PATIENT EDUCATION Description: See Patient Education module for education specifics. Outcome: Progressing Goal: Skin Care Protocol Initiated - if Braden Score 18 or less Description: If consults are not indicated, leave blank or document N/A Outcome: Progressing Goal: Diabetes Guidelines if Diabetic/Glucose > 140 Description: If diabetic or lab glucose is > 140 mg/dl - Initiate Diabetes/Hyperglycemia Guidelines & Document Interventions  Outcome: Progressing   Problem: RH SKIN INTEGRITY Goal: RH STG SKIN FREE OF INFECTION/BREAKDOWN Description: Patients skin will remain free from further infection or breakdown with min assist. Outcome: Progressing Goal: RH STG MAINTAIN SKIN INTEGRITY WITH ASSISTANCE Description: STG Maintain Skin Integrity With min Assistance. Outcome: Progressing Goal: RH STG ABLE TO PERFORM INCISION/WOUND CARE W/ASSISTANCE Description: STG Able To Perform Incision/Wound Care With total Assistance from caregiver Outcome: Progressing   Problem: RH SAFETY Goal: RH STG ADHERE TO SAFETY PRECAUTIONS W/ASSISTANCE/DEVICE Description: STG Adhere to Safety Precautions With cues Assistance/Device. Outcome: Progressing   Problem: RH PAIN MANAGEMENT Goal: RH STG PAIN MANAGED AT OR BELOW PT'S PAIN GOAL Description: < 4 Outcome: Progressing   

## 2020-02-29 LAB — GLUCOSE, CAPILLARY
Glucose-Capillary: 159 mg/dL — ABNORMAL HIGH (ref 70–99)
Glucose-Capillary: 149 mg/dL — ABNORMAL HIGH (ref 70–99)
Glucose-Capillary: 151 mg/dL — ABNORMAL HIGH (ref 70–99)
Glucose-Capillary: 193 mg/dL — ABNORMAL HIGH (ref 70–99)

## 2020-02-29 MED ORDER — METFORMIN HCL ER 500 MG PO TB24
500.0000 mg | ORAL_TABLET | Freq: Two times a day (BID) | ORAL | Status: DC
  Administered 2020-02-29 – 2020-03-05 (×10): 500 mg via ORAL
  Filled 2020-02-29 (×10): qty 1

## 2020-02-29 NOTE — Plan of Care (Signed)
  Problem: Consults Goal: RH GENERAL PATIENT EDUCATION Description: See Patient Education module for education specifics. Outcome: Progressing Goal: Skin Care Protocol Initiated - if Braden Score 18 or less Description: If consults are not indicated, leave blank or document N/A Outcome: Progressing Goal: Diabetes Guidelines if Diabetic/Glucose > 140 Description: If diabetic or lab glucose is > 140 mg/dl - Initiate Diabetes/Hyperglycemia Guidelines & Document Interventions  Outcome: Progressing   Problem: RH SKIN INTEGRITY Goal: RH STG SKIN FREE OF INFECTION/BREAKDOWN Description: Patients skin will remain free from further infection or breakdown with min assist. Outcome: Progressing Goal: RH STG MAINTAIN SKIN INTEGRITY WITH ASSISTANCE Description: STG Maintain Skin Integrity With min Assistance. Outcome: Progressing Goal: RH STG ABLE TO PERFORM INCISION/WOUND CARE W/ASSISTANCE Description: STG Able To Perform Incision/Wound Care With total Assistance from caregiver Outcome: Progressing   Problem: RH SAFETY Goal: RH STG ADHERE TO SAFETY PRECAUTIONS W/ASSISTANCE/DEVICE Description: STG Adhere to Safety Precautions With cues Assistance/Device. Outcome: Progressing   Problem: RH PAIN MANAGEMENT Goal: RH STG PAIN MANAGED AT OR BELOW PT'S PAIN GOAL Description: < 4 Outcome: Progressing

## 2020-02-29 NOTE — Progress Notes (Signed)
[place] PHYSICAL MEDICINE & REHABILITATION PROGRESS NOTE   Subjective/Complaints:  Discussed CBGs and hypertension.  Pt feels BP elevated due to pain  Pt trying to use tramadol , takes 15mg  hydrocodone QID   ROS- neg CP, SOB, N/V/D   Objective:   No results found. Recent Labs    02/27/20 0621  WBC 12.1*  HGB 8.5*  HCT 26.3*  PLT 310   Recent Labs    02/27/20 0621  NA 139  K 3.5  CL 107  CO2 25  GLUCOSE 187*  BUN 14  CREATININE 0.72  CALCIUM 8.6*    Intake/Output Summary (Last 24 hours) at 02/29/2020 0757 Last data filed at 02/29/2020 0519 Gross per 24 hour  Intake 620 ml  Output 1201 ml  Net -581 ml     Physical Exam: Vital Signs Blood pressure (!) 159/91, pulse 84, temperature 98.1 F (36.7 C), resp. rate 16, height 5\' 11"  (1.803 m), weight 129.3 kg, SpO2 100 %.    General: No acute distress Mood and affect are appropriate Heart: Regular rate and rhythm no rubs murmurs or extra sounds Lungs: Clear to auscultation, breathing unlabored, no rales or wheezes Abdomen: Positive bowel sounds, soft nontender to palpation, nondistended Extremities: No clubbing, cyanosis, or edema  Neurologic: Cranial nerves II through XII intact, motor strength is 5/5 in bilateral deltoid, bicep, tricep, grip, Left knee orthosis fixed in ext , able to wiggle toes, RLE 4+/5 Sensory exam normal sensation to light touch  In toes   Musculoskeletal: Full range of motion in all 4 extremities. No joint swelling   Assessment/Plan: 1. Functional deficits secondary to polytrauma which require 3+ hours per day of interdisciplinary therapy in a comprehensive inpatient rehab setting.  Physiatrist is providing close team supervision and 24 hour management of active medical problems listed below.  Physiatrist and rehab team continue to assess barriers to discharge/monitor patient progress toward functional and medical goals  Care Tool:  Bathing    Body parts bathed by patient:  Right arm, Left arm, Chest, Abdomen, Front perineal area, Right upper leg, Left upper leg, Face   Body parts bathed by helper: Buttocks     Bathing assist Assist Level: Maximal Assistance - Patient 24 - 49%     Upper Body Dressing/Undressing Upper body dressing   What is the patient wearing?: Pull over shirt    Upper body assist Assist Level: Supervision/Verbal cueing    Lower Body Dressing/Undressing Lower body dressing      What is the patient wearing?: Underwear/pull up, Pants     Lower body assist Assist for lower body dressing: Moderate Assistance - Patient 50 - 74%     Toileting Toileting Toileting Activity did not occur (Clothing management and hygiene only): N/A (no void or bm)  Toileting assist Assist for toileting: Minimal Assistance - Patient > 75%     Transfers Chair/bed transfer  Transfers assist     Chair/bed transfer assist level: Moderate Assistance - Patient 50 - 74%     Locomotion Ambulation   Ambulation assist      Assist level: Moderate Assistance - Patient 50 - 74% Assistive device: Walker-rolling Max distance: 40   Walk 10 feet activity   Assist     Assist level: Moderate Assistance - Patient - 50 - 74% Assistive device: Walker-rolling, Orthosis   Walk 50 feet activity   Assist Walk 50 feet with 2 turns activity did not occur: Safety/medical concerns         Walk 150  feet activity   Assist Walk 150 feet activity did not occur: Safety/medical concerns         Walk 10 feet on uneven surface  activity   Assist Walk 10 feet on uneven surfaces activity did not occur: Safety/medical concerns         Wheelchair     Assist Will patient use wheelchair at discharge?: Yes Type of Wheelchair: Manual    Wheelchair assist level: Supervision/Verbal cueing Max wheelchair distance: 150    Wheelchair 50 feet with 2 turns activity    Assist        Assist Level: Supervision/Verbal cueing   Wheelchair  150 feet activity     Assist      Assist Level: Supervision/Verbal cueing   Blood pressure (!) 159/91, pulse 84, temperature 98.1 F (36.7 C), resp. rate 16, height 5\' 11"  (1.803 m), weight 129.3 kg, SpO2 100 %.  Medical Problem List and Plan: 1.  Impaired function, ADLs, and mobility secondary to polytrauma- falling from 15 ft ladder- L tibial plateau fx- s/pp ex-fix then closed reduction- is NWB on LLE- laceration RLE- WBAT; and s/p chest tube for tension pneumothorax. Needs to continue wound VAC for LLE for now.              -patient may  Shower once VAC is removed             -ELOS/Goals: 18-12 days- mod I/supervision CIR PT, OT evals today  2.  Antithrombotics: -DVT/anticoagulation:  Pharmaceutical: Lovenox             -antiplatelet therapy: N/a 3. Pain Management: Vicodin prn effective.- alternating with muscle relaxant- will continue  Pt using tramadol more, discussed scheduling but pt prefers prn 4. Mood: LCSW to follow for evaluation and support.              -antipsychotic agents: N/A 5. Neuropsych: This patient is capable of making decisions on his own behalf. 6. Skin/Wound Care: Keep wound clean and dry--monitor for healing. Wound VAC on left tibia to stay in place till next week--change out to prevena.  7. Fluids/Electrolytes/Nutrition: Monitor I/O. Appetite has been good. Check lytes in am.. 8. T2DM: Will continue to monitor BS ac/hs and continue SSI. Resume metformin --family to bring in Trulicity--weekly on Sun. Will resume amaryl as indicated. CBG (last 3)  Recent Labs    02/28/20 1141 02/28/20 1717 02/29/20 0624  GLUCAP 136* 127* 159*  controlled 1/93 no Trulicity x 2 wks , controlled on GLucophage XR 100mg  BID but likely has a different diet at home, will reduce Glucophage XR to 500mg  BID  9. HTN: Monitor BP tid--continue Lisinopril daily. Titrate for better control  Vitals:   02/28/20 1959 02/29/20 0519  BP: (!) 150/85 (!) 159/91  Pulse: 96 84  Resp: 18  16  Temp: 98.4 F (36.9 C) 98.1 F (36.7 C)  SpO2: 98% 100%  elevated  10. Leucocytosis: Monitor for signs of infection. Recheck CBC in am. Patient continues to use ICS q1 hours while awake.  11. ABLA: Stable. Add iron supplement. Hgb still 8.5  12. Right PTX: Resolved. Continue IS. CT has been removed     LOS: 3 days A FACE TO FACE EVALUATION WAS PERFORMED  Charlett Blake 02/29/2020, 7:57 AM

## 2020-03-01 ENCOUNTER — Inpatient Hospital Stay (HOSPITAL_COMMUNITY): Payer: Medicare Other | Admitting: Physical Therapy

## 2020-03-01 ENCOUNTER — Inpatient Hospital Stay (HOSPITAL_COMMUNITY): Payer: Medicare Other | Admitting: Occupational Therapy

## 2020-03-01 ENCOUNTER — Encounter (HOSPITAL_COMMUNITY): Payer: Medicare Other | Admitting: Psychology

## 2020-03-01 DIAGNOSIS — S82142S Displaced bicondylar fracture of left tibia, sequela: Secondary | ICD-10-CM

## 2020-03-01 DIAGNOSIS — W11XXXS Fall on and from ladder, sequela: Secondary | ICD-10-CM

## 2020-03-01 LAB — CBC
HCT: 28.1 % — ABNORMAL LOW (ref 39.0–52.0)
Hemoglobin: 8.8 g/dL — ABNORMAL LOW (ref 13.0–17.0)
MCH: 29.9 pg (ref 26.0–34.0)
MCHC: 31.3 g/dL (ref 30.0–36.0)
MCV: 95.6 fL (ref 80.0–100.0)
Platelets: 357 10*3/uL (ref 150–400)
RBC: 2.94 MIL/uL — ABNORMAL LOW (ref 4.22–5.81)
RDW: 15.5 % (ref 11.5–15.5)
WBC: 8.7 10*3/uL (ref 4.0–10.5)
nRBC: 0 % (ref 0.0–0.2)

## 2020-03-01 LAB — BASIC METABOLIC PANEL
Anion gap: 10 (ref 5–15)
BUN: 16 mg/dL (ref 8–23)
CO2: 27 mmol/L (ref 22–32)
Calcium: 8.9 mg/dL (ref 8.9–10.3)
Chloride: 104 mmol/L (ref 98–111)
Creatinine, Ser: 0.82 mg/dL (ref 0.61–1.24)
GFR calc Af Amer: >60 mL/min (ref >60)
GFR calc non Af Amer: >60 mL/min (ref >60)
Glucose, Bld: 162 mg/dL — ABNORMAL HIGH (ref 70–99)
Potassium: 3.7 mmol/L (ref 3.5–5.1)
Sodium: 141 mmol/L (ref 135–145)

## 2020-03-01 LAB — GLUCOSE, CAPILLARY
Glucose-Capillary: 156 mg/dL — ABNORMAL HIGH (ref 70–99)
Glucose-Capillary: 138 mg/dL — ABNORMAL HIGH (ref 70–99)
Glucose-Capillary: 134 mg/dL — ABNORMAL HIGH (ref 70–99)
Glucose-Capillary: 156 mg/dL — ABNORMAL HIGH (ref 70–99)

## 2020-03-01 NOTE — Progress Notes (Signed)
[place] PHYSICAL MEDICINE & REHABILITATION PROGRESS NOTE   Subjective/Complaints:  Up at EOB.needs to use toilet. Knee pain controlled.  ROS: Patient denies fever, rash, sore throat, blurred vision, nausea, vomiting, diarrhea, cough, shortness of breath or chest pain,   headache, or mood change.   Objective:   No results found. Recent Labs    03/01/20 0644  WBC 8.7  HGB 8.8*  HCT 28.1*  PLT 357   Recent Labs    03/01/20 0644  NA 141  K 3.7  CL 104  CO2 27  GLUCOSE 162*  BUN 16  CREATININE 0.82  CALCIUM 8.9    Intake/Output Summary (Last 24 hours) at 03/01/2020 1114 Last data filed at 03/01/2020 0900 Gross per 24 hour  Intake 500 ml  Output 1700 ml  Net -1200 ml     Physical Exam: Vital Signs Blood pressure 138/79, pulse 80, temperature 97.7 F (36.5 C), resp. rate 15, height 5\' 11"  (1.803 m), weight 129.3 kg, SpO2 100 %.    Constitutional: No distress . Vital signs reviewed. HEENT: EOMI, oral membranes moist Neck: supple Cardiovascular: RRR without murmur. No JVD    Respiratory/Chest: CTA Bilaterally without wheezes or rales. Normal effort    GI/Abdomen: BS +, non-tender, non-distended Ext: no clubbing, cyanosis, or edema Psych: pleasant and cooperative Neurologic: Cranial nerves II through XII intact, motor strength is 5/5 in bilateral deltoid, bicep, tricep, grip, Left knee orthosis fixed in ext , able to wiggle toes, RLE 4+/5 Sensory exam normal sensation to light touch  In toes   Musculoskeletal: left knee in knee brace,  Skin: Abrasions/incision RLE CDI, left knee with prevena vac, no fluid in cannister  -CT site cdi  Assessment/Plan: 1. Functional deficits secondary to polytrauma which require 3+ hours per day of interdisciplinary therapy in a comprehensive inpatient rehab setting.  Physiatrist is providing close team supervision and 24 hour management of active medical problems listed below.  Physiatrist and rehab team continue to assess  barriers to discharge/monitor patient progress toward functional and medical goals  Care Tool:  Bathing    Body parts bathed by patient: Right arm, Left arm, Chest, Abdomen, Front perineal area, Right upper leg, Left upper leg, Face   Body parts bathed by helper: Buttocks     Bathing assist Assist Level: Maximal Assistance - Patient 24 - 49%     Upper Body Dressing/Undressing Upper body dressing   What is the patient wearing?: Pull over shirt    Upper body assist Assist Level: Supervision/Verbal cueing    Lower Body Dressing/Undressing Lower body dressing      What is the patient wearing?: Underwear/pull up, Pants     Lower body assist Assist for lower body dressing: Moderate Assistance - Patient 50 - 74%     Toileting Toileting Toileting Activity did not occur (Clothing management and hygiene only): N/A (no void or bm)  Toileting assist Assist for toileting: Minimal Assistance - Patient > 75%     Transfers Chair/bed transfer  Transfers assist     Chair/bed transfer assist level: Moderate Assistance - Patient 50 - 74%     Locomotion Ambulation   Ambulation assist      Assist level: Moderate Assistance - Patient 50 - 74% Assistive device: Walker-rolling Max distance: 40   Walk 10 feet activity   Assist     Assist level: Moderate Assistance - Patient - 50 - 74% Assistive device: Walker-rolling, Orthosis   Walk 50 feet activity   Assist Walk 50 feet  with 2 turns activity did not occur: Safety/medical concerns         Walk 150 feet activity   Assist Walk 150 feet activity did not occur: Safety/medical concerns         Walk 10 feet on uneven surface  activity   Assist Walk 10 feet on uneven surfaces activity did not occur: Safety/medical concerns         Wheelchair     Assist Will patient use wheelchair at discharge?: Yes Type of Wheelchair: Manual    Wheelchair assist level: Supervision/Verbal cueing Max wheelchair  distance: 150    Wheelchair 50 feet with 2 turns activity    Assist        Assist Level: Supervision/Verbal cueing   Wheelchair 150 feet activity     Assist      Assist Level: Supervision/Verbal cueing   Blood pressure 138/79, pulse 80, temperature 97.7 F (36.5 C), resp. rate 15, height 5\' 11"  (1.803 m), weight 129.3 kg, SpO2 100 %.  Medical Problem List and Plan: 1.  Impaired function, ADLs, and mobility secondary to polytrauma- falling from 15 ft ladder- L tibial plateau fx- s/pp ex-fix then closed reduction- is NWB on LLE- laceration RLE- WBAT; and s/p chest tube for tension pneumothorax.   wound VAC for LLE for now.              -patient may  Shower once VAC is removed             -ELOS/Goals: 18-12 days- mod I/supervision   --Continue CIR therapies including PT, OT  2.  Antithrombotics: -DVT/anticoagulation:  Pharmaceutical: Lovenox             -antiplatelet therapy: N/a 3. Pain Management: Vicodin prn effective.- alternating with muscle relaxant- will continue   -uses prn tramadol more often 4. Mood: LCSW to follow for evaluation and support.              -antipsychotic agents: N/A 5. Neuropsych: This patient is capable of making decisions on his own behalf. 6. Skin/Wound Care: Keep wound clean and dry--monitor for healing.   -4/26 pt with prevena on left tibial wound. No drainage in canister. Spoke to ortho who wants it to remain on for now. They'll follow up with it. 7. Fluids/Electrolytes/Nutrition: Monitor I/O. Appetite has been good. Check lytes in am.. 8. T2DM: Will continue to monitor BS ac/hs and continue SSI. Resume metformin --family to bring in Trulicity--weekly on Sun. Will resume amaryl as indicated. CBG (last 3)  Recent Labs    02/29/20 1631 02/29/20 2122 03/01/20 0634  GLUCAP 151* 193* 156*  controlled 4/25 no Trulicity x 2 wks , controlled on GLucophage XR 1000mg  BID but likely has a different diet at home, will reduce Glucophage XR to 500mg   BID   4/26 observe on decreased dosing today, fair to borderline control so far 9. HTN: Monitor BP tid--continue Lisinopril daily. Titrate for better control  Vitals:   03/01/20 0541 03/01/20 0809  BP: (!) 151/66 138/79  Pulse: 81 80  Resp: 15   Temp: 97.7 F (36.5 C)   SpO2: 100%   elevated  10. Leukocytosis: wbc's down to 8.7 4/26  11. ABLA: Stable. Add iron supplement. hgb up to 8.8 4/26 12. Right PTX: Resolved. Continue IS. CT has been removed     LOS: 4 days A FACE TO FACE EVALUATION WAS PERFORMED  03/03/20 03/01/2020, 11:14 AM

## 2020-03-01 NOTE — Progress Notes (Signed)
Occupational Therapy Session Note  Patient Details  Name: Cameron Velasquez MRN: [id number] Date of Birth: April 20, 1956  Today's Date: 03/01/2020 OT Individual Time: 6295-2841 OT Individual Time Calculation (min): 73 min   Short Term Goals: Week 1:  OT Short Term Goal 1 (Week 1): Pt will complete toileting with Mod Cameron OT Short Term Goal 2 (Week 1): Pt will complete LB dressing with supervision for dynamic standing balance OT Short Term Goal 3 (Week 1): Pt will don footwear with supervision assist with AE as needed  Skilled Therapeutic Interventions/Progress Updates:    Pt greeted in the recliner, premedicated for pain. Started session by reviewing limb wrapping for when he is medically cleared to shower. OT demonstrated technique first and then pt was able to exhibit understanding by double wrapping his Lt LE. Pt aware of the materials he will need to wrap his limb at home. He then completed Cameron short distance ambulatory transfer to the w/c using RW, keeping Lt LE off of the floor for NWB and supervision assist. Pt then self propelled w/c to the therapy apartment bathroom for UB strengthening. We did not practice the TTB transfer due to his ROM restrictions and small space of the shower given pts leg length, but OT explained technique. Pt has two bathrooms and two shower options at home. We openly collaborated regarding which shower and DME to use at d/c. Pt in time reported that he thinks he will place the TTB he already owns in the walk in shower so that pt can support his Lt LE while maintaining his precautions. Nighttime toileting also problem solved. Pt then self propelled back to his room and completed another ambulatory transfer to his recliner in the manner as written above. Left him with all needs within reach, Lt LE elevated with ice for pain mgt.    Therapy Documentation Precautions:  Precautions Precautions: Fall Precaution Comments: LLE wound vac Required Braces or Orthoses: Other  Brace Other Brace: hinged knee brace locked in extension Restrictions Weight Bearing Restrictions: Yes RLE Weight Bearing: Weight bearing as tolerated LLE Weight Bearing: Non weight bearing Other Position/Activity Restrictions: No ROM L knee x2 weeks from sx on 4/19; unrestricted ROM RLE   Pain: Pain Assessment Pain Score: 5  ADL: ADL Eating: Not assessed Grooming: Setup Where Assessed-Grooming: Sitting at sink Upper Body Bathing: Setup Where Assessed-Upper Body Bathing: Edge of bed Lower Body Bathing: Minimal assistance Where Assessed-Lower Body Bathing: Edge of bed Upper Body Dressing: Setup Where Assessed-Upper Body Dressing: Edge of bed Lower Body Dressing: Moderate assistance Where Assessed-Lower Body Dressing: Edge of bed Toileting: Maximal assistance Where Assessed-Toileting: Teacher, adult education: Furniture conservator/restorer Method: Ambulating(with RW) Acupuncturist: Raised toilet seat Tub/Shower Transfer: Not assessed          :   :     Therapy/Group: Individual Therapy  Cameron Velasquez Cameron Velasquez 03/01/2020, 12:30 PM

## 2020-03-01 NOTE — Progress Notes (Signed)
Physical Therapy Session Note  Patient Details  Name: Cameron Velasquez MRN: [id number] Date of Birth: Oct 11, 1956  Today's Date: 03/01/2020 PT Individual Time: 0908-1005 PT Individual Time Calculation (min): 57 min   Short Term Goals: Week 1:  PT Short Term Goal 1 (Week 1): Pt will consistently transfer to [facility] with min assist PT Short Term Goal 2 (Week 1): Pt will ambulate 69f with min assist PT Short Term Goal 3 (Week 1): Pt will propell WC through rehab unit x 1531fwithout assist from PT. PT Short Term Goal 4 (Week 1): Bed mobility with min assist and LRAD  Skilled Therapeutic Interventions/Progress Updates: Pt presented coming out of bathroom with NT present agreeable to therapy. Pt states some pain in RLE but was premedicated. Pt propelled w/c to rehab gym supervision A. Performed ambulatory transfer to mat CGA with RW. Pt participated in x2 bouts of Connect Four for standing tolerance with pt able to maintain standing 4 min and 3:30 min respectively. Pt noted increased rib pain and fatigue limiting standing tolerance. During seated rest breaks pt indicated contacted ramp company and hopes that ramp will be installed by end of week. Participated in ambulation x 5050fith CGA for endurance with pt performing STS throughout session with CGA. Pt noted significant fatigue after ambulation requiring increased time for recovery. Pt propelled back to room and performed ambulatory transfer to recliner with RW and CGA. Pt left in recliner at end of session with call bell within reach and current needs met.      Therapy Documentation Precautions:  Precautions Precautions: Fall Precaution Comments: LLE wound vac Required Braces or Orthoses: Other Brace Other Brace: hinged knee brace locked in extension Restrictions Weight Bearing Restrictions: Yes RLE Weight Bearing: Weight bearing as tolerated LLE Weight Bearing: Non weight bearing Other Position/Activity Restrictions: No ROM L knee x2 weeks  from sx on 4/19; unrestricted ROM RLE General:   Vital Signs:   Pain: Pain Assessment Pain Score: 5    Therapy/Group: Individual Therapy  Seith Aikey  Malicia Blasdel, PTA  03/01/2020, 12:31 PM

## 2020-03-01 NOTE — Progress Notes (Signed)
Occupational Therapy Session Note  Patient Details  Name: Cameron Velasquez MRN: [id number] Date of Birth: 24-Mar-1956  Today's Date: 03/01/2020 OT Individual Time: 1430-1530 OT Individual Time Calculation (min): 60 min    Short Term Goals: Week 1:  OT Short Term Goal 1 (Week 1): Pt will complete toileting with Mod A OT Short Term Goal 2 (Week 1): Pt will complete LB dressing with supervision for dynamic standing balance OT Short Term Goal 3 (Week 1): Pt will don footwear with supervision assist with AE as needed  Skilled Therapeutic Interventions/Progress Updates:    Upon entering the room, pt seated in recliner chair with no c/o pain. Pt requesting HEP for B UE strengthening exercises. OT reviewed exercises with pt's use of green and blue band for varying level of resistance. Pt performing 3 sets of 15 chest pulls, shoulder diagonals, shoulder elevation, bicep curls, and alternating punches. Pt required min cuing for technique and paper handout provided for pt to perform exercises on his own time as well. Pt remained in chair with call bell and all needed items within reach upon exiting the room.   Therapy Documentation Precautions:  Precautions Precautions: Fall Precaution Comments: LLE wound vac Required Braces or Orthoses: Other Brace Other Brace: hinged knee brace locked in extension Restrictions Weight Bearing Restrictions: Yes RLE Weight Bearing: Weight bearing as tolerated LLE Weight Bearing: Non weight bearing Other Position/Activity Restrictions: No ROM L knee x2 weeks from sx on 4/19; unrestricted ROM RLE Vital Signs: Therapy Vitals Temp: 98.5 F (36.9 C) Pulse Rate: 91 Resp: 18 BP: (!) 150/80 Patient Position (if appropriate): Sitting Oxygen Therapy SpO2: 97 % O2 Device: Room Air Pain: Pain Assessment Pain Scale: 0-10 Pain Score: 5  Pain Location: Rib cage Pain Orientation: Right Pain Descriptors / Indicators: Aching Pain Frequency: Constant Pain Onset:  On-going Patients Stated Pain Goal: 4 Pain Intervention(s): Medication (See eMAR) ADL: ADL Eating: Not assessed Grooming: Setup Where Assessed-Grooming: Sitting at sink Upper Body Bathing: Setup Where Assessed-Upper Body Bathing: Edge of bed Lower Body Bathing: Minimal assistance Where Assessed-Lower Body Bathing: Edge of bed Upper Body Dressing: Setup Where Assessed-Upper Body Dressing: Edge of bed Lower Body Dressing: Moderate assistance Where Assessed-Lower Body Dressing: Edge of bed Toileting: Maximal assistance Where Assessed-Toileting: Teacher, adult education: Furniture conservator/restorer Method: Ambulating(with RW) Acupuncturist: Raised toilet seat Tub/Shower Transfer: Not assessed   Therapy/Group: Individual Therapy  Alen Bleacher 03/01/2020, 4:26 PM

## 2020-03-01 NOTE — Consult Note (Signed)
Neuropsychological Consultation   Patient:   Cameron Velasquez   DOB:   06/28/1956  MR Number:  [id number]  Location:  [place] [id number] [place] [address] Dept: [place]: [phone]           Date of Service:   03/01/2020  Start Time:   8 AM End Time:   9 AM  Provider/Observer:  Ilean Skill, Psy.D.       Clinical Neuropsychologist       Billing Code/Service: [id number]  Chief Complaint:    Cameron Velasquez is a 64 year old male with history of hypertension, type 2 diabetes, GERD.  Patient was admitted on 02/16/2020 after falling 15 feet off a ladder and hit the [facility] unit when landing.  Patient had left tibial plateau fracture, left calf wound, open left communicated proximal tibia and acute nondisplaced fibular head fracture, right lower extremity laceration, seventh rib fracture with small pneumothorax.  Patient taken to the OR for orthopedic interventions and was recommended to comprehensive inpatient rehabilitation program due to functional decline.  Reason for Service:  Patient was referred for neuropsychological consultation due to coping and adjustment issues.  Below is the HPI for the current mission.  HPI: Cameron Velasquez is a 64 year old male with history of HTN, T2DM, GERD, who was admitted on 02/16/20 after falling 15' of a ladder and hit the [facility] unit on the way down. He had reports of right shoulder and bilateral leg pain. Work up done revealing left bicondylar tibial plateau fracture, open left calf wound, open left comminuted proximal tiba and acute non displaced fibular head Fx,  Large left lipohemarthrosis, RLE laceration, right 7th rib fracture with small PTX. He was taken to OR for I & D of BLE with close reduction of left tibia, aspiration of left knee hemarthrosis, placement of external fixator LLE, I & D with closure of RLE traumatic wound with placement of prevena  VAC.  Follow up CXR showed large right PTX on 4/13 and chest tube placed by Dr. Donne Hazel with minimal bloody output. This was removed on 4/16 and follow up CXR stable.   He was taken back to OR on 4/19 for removal of external fixator with ORIF --no ROM X 2 week with hinged brace locked out then gentle ROM. No active extension X 6 weeks and NWB LLE. Prevenal removed in OR and RLE wound healing well. Hospital course significant for acute renal failure which has resolved, poorly controlled BS, ABLA as well as persistent leucocytosis. Respiratory status is stable and activity tolerance is improving. Therapy ongoing---is he able to maintain NWB and able to hop on RLE. CIR recommended due to functional decline in ADLs and mobility.   Current Status:  Patient was well oriented with good cognition today and positive mood state.  Patient was able to understand what it happened and what limitations he needs to have a source physical movement during recovery.   Behavioral Observation: Cameron Velasquez  presents as a 64 y.o.-year-old Right Caucasian Male who appeared his stated age. his dress was Appropriate and he was Well Groomed and his manners were Appropriate to the situation.  his participation was indicative of Appropriate and Attentive behaviors.  There were physical disabilities noted.  he displayed an appropriate level of cooperation and motivation.     Interactions:    Active Appropriate and Attentive  Attention:   within normal limits and attention span and concentration were  age appropriate  Memory:   within normal limits; recent and remote memory intact  Visuo-spatial:  not examined  Speech (Volume):  normal  Speech:   normal; normal  Thought Process:  Coherent and Relevant  Though Content:  WNL; not suicidal and not homicidal  Orientation:   person, place, time/date and  situation  Judgment:   Good  Planning:   Good  Affect:    Appropriate  Mood:    Euthymic  Insight:   Good  Intelligence:   normal  Medical History:   Past Medical History:  Diagnosis Date  . Diabetes mellitus without complication (HCC)   . Hypertension    Psychiatric History:  No prior psychiatric history  Family Med/Psych History:  Family History  Problem Relation Age of Onset  . Diabetes Mother   . Breast cancer Mother   . Diabetes Father   . Stroke Father   . Stroke Brother   . Diabetes Brother       Impression/DX:  Cameron Velasquez is a 64 year old male with history of hypertension, type 2 diabetes, GERD.  Patient was admitted on 02/16/2020 after falling 15 feet off a ladder and hit the [facility] unit when landing.  Patient had left tibial plateau fracture, left calf wound, open left communicated proximal tibia and acute nondisplaced fibular head fracture, right lower extremity laceration, seventh rib fracture with small pneumothorax.  Patient taken to the OR for orthopedic interventions and was recommended to comprehensive inpatient rehabilitation program due to functional decline.  Patient was well oriented with good cognition today and positive mood state.  Patient was able to understand what it happened and what limitations he needs to have a source physical movement during recovery.         Electronically Signed   _______________________ Arley Phenix, Psy.D.

## 2020-03-02 ENCOUNTER — Inpatient Hospital Stay (HOSPITAL_COMMUNITY): Payer: Medicare Other | Admitting: Physical Therapy

## 2020-03-02 ENCOUNTER — Inpatient Hospital Stay (HOSPITAL_COMMUNITY): Payer: Medicare Other | Admitting: Occupational Therapy

## 2020-03-02 LAB — GLUCOSE, CAPILLARY
Glucose-Capillary: 148 mg/dL — ABNORMAL HIGH (ref 70–99)
Glucose-Capillary: 149 mg/dL — ABNORMAL HIGH (ref 70–99)
Glucose-Capillary: 133 mg/dL — ABNORMAL HIGH (ref 70–99)
Glucose-Capillary: 157 mg/dL — ABNORMAL HIGH (ref 70–99)

## 2020-03-02 NOTE — Progress Notes (Signed)
[place] PHYSICAL MEDICINE & REHABILITATION PROGRESS NOTE   Subjective/Complaints:  Has no severe pain today.  He is taking pain medications on a regular basis, hydrocodone 15 mg to p.o. averaging 3-5 times per day.  Tramadol taking 100 mg inconsistently from 0-3 times per day  ROS: Patient denies nausea vomiting diarrhea constipation Objective:   No results found. Recent Labs    03/01/20 0644  WBC 8.7  HGB 8.8*  HCT 28.1*  PLT 357   Recent Labs    03/01/20 0644  NA 141  K 3.7  CL 104  CO2 27  GLUCOSE 162*  BUN 16  CREATININE 0.82  CALCIUM 8.9    Intake/Output Summary (Last 24 hours) at 03/02/2020 1884 Last data filed at 03/02/2020 1660 Gross per 24 hour  Intake --  Output 1700 ml  Net -1700 ml     Physical Exam: Vital Signs Blood pressure 135/76, pulse 85, temperature 97.8 F (36.6 C), temperature source Oral, resp. rate 18, height 5\' 11"  (1.803 m), weight 129.3 kg, SpO2 97 %.    Constitutional: No distress . Vital signs reviewed.  General: No acute distress Mood and affect are appropriate Heart: Regular rate and rhythm no rubs murmurs or extra sounds Lungs: Clear to auscultation, breathing unlabored, no rales or wheezes Abdomen: Positive bowel sounds, soft nontender to palpation, nondistended Extremities: No clubbing, cyanosis, or edema Skin: No evidence of breakdown, no evidence of rash Neurologic: Cranial nerves II through XII intact, motor strength is 5/5 in bilateral deltoid, bicep, tricep, grip,R hip flexor, knee extensors, ankle dorsiflexor and plantar flexor Left LE with wound vac ace wrap, 4- Knee ex , limited ankle DF/PF  Musculoskeletal: Limited range of motion in left ankle Musculoskeletal: left knee in knee brace,  Skin: Abrasions/incision RLE CDI, left knee with prevena vac, no fluid in cannister   Assessment/Plan: 1. Functional deficits secondary to polytrauma which require 3+ hours per day of interdisciplinary therapy in a  comprehensive inpatient rehab setting.  Physiatrist is providing close team supervision and 24 hour management of active medical problems listed below.  Physiatrist and rehab team continue to assess barriers to discharge/monitor patient progress toward functional and medical goals  Care Tool:  Bathing    Body parts bathed by patient: Right arm, Left arm, Chest, Abdomen, Front perineal area, Right upper leg, Left upper leg, Face, Buttocks   Body parts bathed by helper: Right lower leg Body parts n/a: Left lower leg   Bathing assist Assist Level: Minimal Assistance - Patient > 75%     Upper Body Dressing/Undressing Upper body dressing   What is the patient wearing?: Pull over shirt    Upper body assist Assist Level: Supervision/Verbal cueing    Lower Body Dressing/Undressing Lower body dressing      What is the patient wearing?: Underwear/pull up, Pants     Lower body assist Assist for lower body dressing: Contact Guard/Touching assist     Toileting Toileting Toileting Activity did not occur (Clothing management and hygiene only): N/A (no void or bm)  Toileting assist Assist for toileting: Contact Guard/Touching assist     Transfers Chair/bed transfer  Transfers assist     Chair/bed transfer assist level: Contact Guard/Touching assist     Locomotion Ambulation   Ambulation assist      Assist level: Contact Guard/Touching assist Assistive device: Walker-rolling Max distance: 10'   Walk 10 feet activity   Assist     Assist level: Contact Guard/Touching assist Assistive device: Walker-rolling  Walk 50 feet activity   Assist Walk 50 feet with 2 turns activity did not occur: Safety/medical concerns  Assist level: Contact Guard/Touching assist Assistive device: Walker-rolling    Walk 150 feet activity   Assist Walk 150 feet activity did not occur: Safety/medical concerns         Walk 10 feet on uneven surface  activity   Assist Walk  10 feet on uneven surfaces activity did not occur: Safety/medical concerns         Wheelchair     Assist Will patient use wheelchair at discharge?: Yes Type of Wheelchair: Manual    Wheelchair assist level: Supervision/Verbal cueing Max wheelchair distance: 150    Wheelchair 50 feet with 2 turns activity    Assist        Assist Level: Supervision/Verbal cueing   Wheelchair 150 feet activity     Assist      Assist Level: Supervision/Verbal cueing   Blood pressure 135/76, pulse 85, temperature 97.8 F (36.6 C), temperature source Oral, resp. rate 18, height 5\' 11"  (1.803 m), weight 129.3 kg, SpO2 97 %.  Medical Problem List and Plan: 1.  Impaired function, ADLs, and mobility secondary to polytrauma- falling from 15 ft ladder- L tibial plateau fx- s/pp ex-fix then closed reduction- is NWB on LLE- laceration RLE- WBAT; and s/p chest tube for tension pneumothorax.   wound VAC for LLE for now.              -patient may  Shower once VAC is removed We will touch base with Ortho to see when VAC will be removed Team conference in a.m.             -ELOS/Goals: 18-12 days- mod I/supervision   --Continue CIR therapies including PT, OT  2.  Antithrombotics: -DVT/anticoagulation:  Pharmaceutical: Lovenox             -antiplatelet therapy: N/a 3. Pain Management: Vicodin prn effective.- alternating with muscle relaxant- will continue   -We will start reducing Vicodin dosing and encourage patient to increase tramadol use starting tomorrow 4. Mood: LCSW to follow for evaluation and support.              -antipsychotic agents: N/A 5. Neuropsych: This patient is capable of making decisions on his own behalf. 6. Skin/Wound Care: Keep wound clean and dry--monitor for healing.   -4/26 pt with prevena on left tibial wound. No drainage in canister. Spoke to ortho who wants it to remain on for now. They'll follow up with it. 7. Fluids/Electrolytes/Nutrition: Monitor I/O. Appetite  has been good. Check lytes in am.. 8. T2DM: Will continue to monitor BS ac/hs and continue SSI. Resume metformin --family to bring in Trulicity--weekly on Sun. Will resume amaryl as indicated. CBG (last 3)  Recent Labs    03/01/20 1715 03/01/20 2120 03/02/20 0621  GLUCAP 134* 156* 148*  Controlled 4/27 off Trulicity, takes Metformin XR 500 mg twice daily, using an average of 4 units/day of NovoLog for coverage, may consider switch Metformin to 850 twice daily so we can get away from NovoLog coverage we will continue to monitor for now 9. HTN: Monitor BP tid--continue Lisinopril daily. Titrate for better control  Vitals:   03/02/20 0502 03/02/20 0832  BP: (!) 150/79 135/76  Pulse: 81 85  Resp: 18   Temp: 97.8 F (36.6 C)   SpO2: 97%   Fair control on Zestril 10. Leukocytosis: wbc's down to 8.7 4/26  11. ABLA: Stable. Add iron supplement.  hgb up to 8.8 4/26 12. Right PTX: Resolved. Continue IS. CT has been removed     LOS: 5 days A FACE TO [place] Velasquez Cameron Velasquez 03/02/2020, 9:07 AM

## 2020-03-02 NOTE — Progress Notes (Signed)
Physical Therapy Session Note  Patient Details  Name: Cameron Velasquez MRN: [id number] Date of Birth: 1956/01/03  Today's Date: 03/02/2020 PT Individual Time: 5830-9407 PT Individual Time Calculation (min): 60 min   Short Term Goals: Week 1:  PT Short Term Goal 1 (Week 1): Pt will consistently transfer to [facility] with min assist PT Short Term Goal 2 (Week 1): Pt will ambulate 39f with min assist PT Short Term Goal 3 (Week 1): Pt will propell WC through rehab unit x 1555fwithout assist from PT. PT Short Term Goal 4 (Week 1): Bed mobility with min assist and LRAD  Skilled Therapeutic Interventions/Progress Updates: Pt presented in recliner agreeable to therapy. Session focused on w/c mobility and parts management. Pt states 5/10 pain in LLE was premedicated and ice pack on LLE upon arrival. Discussed performing ankle pumps and QS for edema and pain management. Performed stand pivot to w/c with supervision overall. PTA instructed pt on placement of ELR and pt was able to set up and lock in place with supervision and increased time. Pt was then able to propel through unit to 4N tower hall >30079fith supervision. PTA transported to elevators for energy conservation and pt was able to reverse into elevators with verbal cues for negotiation. Pt able to propel through lobby and through front entrance of hospital. Pt demonstrated good safety with w/c propulsion in community environment. Performed w/c mobility in courtyard around water fountain and on uneven surfaces. [facility] and participated in w/c mobility up/down ramp for BUE strengthening and endurance. Pt propelled back to unit and returned to room. Performed ambulatory transfer back to recliner and left with legs elevated, call bell within reach and needs met.      Therapy Documentation Precautions:  Precautions Precautions: Fall Precaution Comments: LLE wound vac Required Braces or Orthoses: Other Brace Other Brace:  hinged knee brace locked in extension Restrictions Weight Bearing Restrictions: Yes RLE Weight Bearing: Weight bearing as tolerated LLE Weight Bearing: Non weight bearing Other Position/Activity Restrictions: No ROM L knee x2 weeks from sx on 4/19; unrestricted ROM RLE General:   Vital Signs: Therapy Vitals Temp: 98 F (36.7 C) Pulse Rate: 85 Resp: 18 BP: (!) 144/77 Patient Position (if appropriate): Sitting Oxygen Therapy SpO2: 98 % O2 Device: Room Air Pain: Pain Assessment Pain Scale: 0-10 Pain Score: 5  Pain Type: Surgical pain Pain Location: Rib cage(left shin) Pain Orientation: Right;Left Pain Descriptors / Indicators: Aching;Throbbing Pain Frequency: Constant Pain Onset: On-going Patients Stated Pain Goal: 4 Pain Intervention(s): Medication (See eMAR) Mobility:   Locomotion :    Trunk/Postural Assessment :    Balance:   Exercises:   Other Treatments:      Therapy/Group: Individual Therapy  Sindi Beckworth 03/02/2020, 4:26 PM

## 2020-03-02 NOTE — Progress Notes (Signed)
Occupational Therapy Session Note  Patient Details  Name: Cameron Velasquez MRN: [id number] Date of Birth: 04-22-1956  Today's Date: 03/02/2020 OT Individual Time: 0701-0810 and 1432-1530 OT Individual Time Calculation (min): 69 min and 58 mins   Short Term Goals: Week 1:  OT Short Term Goal 1 (Week 1): Pt will complete toileting with Mod A OT Short Term Goal 2 (Week 1): Pt will complete LB dressing with supervision for dynamic standing balance OT Short Term Goal 3 (Week 1): Pt will don footwear with supervision assist with AE as needed  Skilled Therapeutic Interventions/Progress Updates:    Session 1: Upon entering the room, pt supine in bed with increased pain and reports asking for pain medication prior to OT arrival. Medication given during this session. Pt performs supine >sit with min A to EOB. Pt unable to stand from 24 inch height bed and needing to increase height of bed for pt to stand with min A. Pt ambulating with RW and CGA into bathroom. Pt able to manage clothing and hygiene while seated. Pt transferred to sitting in wheelchair at sink for bathing and dressing tasks. Pt utilizing reacher with min cuing to thread onto B LEs. Pt standing with min A to pull pants over B hips and returning to wheelchair. Pt seated in wheelchair at sink for grooming task. Pt returning to recliner chair at end of session with min guard for transfer with RW.   Session 2: Upon entering the room, pt seated in recliner chair awaiting OT arrival. Pt requesting to try to transfer in/out of love seat that is 17 inches in height as his furniture at home is between 16- 18 inches. Pt propelled wheelchair with B UEs and supervision to ADL apartment. Pt ambulating on carpeted surface with CGA to sit onto sofa. Pt unable to stand from sofa after multiple attempts. Pt scooting self up to armrest to increase height and pt still needing mod A to stand from low height. OT discussed possible need for furniture risers and pt  looking on phone and possible solution. Pt propelled self back to room and transferred to recliner chair with  CGA with RW. OT provided pt with paper handout for energy conservation and briefly reviewed. Education to continue next session. Chair alarm activated and call bell within reach upon exiting the room.   Therapy Documentation Precautions:  Precautions Precautions: Fall Precaution Comments: LLE wound vac Required Braces or Orthoses: Other Brace Other Brace: hinged knee brace locked in extension Restrictions Weight Bearing Restrictions: Yes RLE Weight Bearing: Weight bearing as tolerated LLE Weight Bearing: Non weight bearing Other Position/Activity Restrictions: No ROM L knee x2 weeks from sx on 4/19; unrestricted ROM RLE General:   Vital Signs: Therapy Vitals Temp: 97.8 F (36.6 C) Temp Source: Oral Pulse Rate: 81 Resp: 18 BP: (!) 150/79 Patient Position (if appropriate): Lying Oxygen Therapy SpO2: 97 % O2 Device: Room Air ADL: ADL Eating: Not assessed Grooming: Setup Where Assessed-Grooming: Sitting at sink Upper Body Bathing: Setup Where Assessed-Upper Body Bathing: Edge of bed Lower Body Bathing: Minimal assistance Where Assessed-Lower Body Bathing: Edge of bed Upper Body Dressing: Setup Where Assessed-Upper Body Dressing: Edge of bed Lower Body Dressing: Moderate assistance Where Assessed-Lower Body Dressing: Edge of bed Toileting: Maximal assistance Where Assessed-Toileting: Teacher, adult education: Furniture conservator/restorer Method: Ambulating(with RW) Acupuncturist: Raised toilet seat Tub/Shower Transfer: Not assessed   Therapy/Group: Individual Therapy  Alen Bleacher 03/02/2020, 8:14 AM

## 2020-03-03 ENCOUNTER — Inpatient Hospital Stay (HOSPITAL_COMMUNITY): Payer: Medicare Other | Admitting: Physical Therapy

## 2020-03-03 ENCOUNTER — Inpatient Hospital Stay (HOSPITAL_COMMUNITY): Payer: Medicare Other | Admitting: Occupational Therapy

## 2020-03-03 LAB — GLUCOSE, CAPILLARY
Glucose-Capillary: 144 mg/dL — ABNORMAL HIGH (ref 70–99)
Glucose-Capillary: 134 mg/dL — ABNORMAL HIGH (ref 70–99)
Glucose-Capillary: 136 mg/dL — ABNORMAL HIGH (ref 70–99)
Glucose-Capillary: 171 mg/dL — ABNORMAL HIGH (ref 70–99)

## 2020-03-03 MED ORDER — HYDROCODONE-ACETAMINOPHEN 5-325 MG PO TABS
1.0000 | ORAL_TABLET | ORAL | Status: DC | PRN
  Administered 2020-03-03: 1 via ORAL
  Administered 2020-03-03 – 2020-03-05 (×8): 2 via ORAL
  Filled 2020-03-03 (×10): qty 2

## 2020-03-03 MED ORDER — TRAMADOL HCL 50 MG PO TABS
50.0000 mg | ORAL_TABLET | Freq: Three times a day (TID) | ORAL | Status: DC | PRN
  Administered 2020-03-03: 100 mg via ORAL
  Administered 2020-03-03: 50 mg via ORAL
  Filled 2020-03-03 (×2): qty 2

## 2020-03-03 NOTE — Progress Notes (Signed)
Physical Therapy Session Note  Patient Details  Name: Cameron Velasquez MRN: [id number] Date of Birth: 03/25/1956  Today's Date: 03/03/2020 PT Individual Time: 1030-1055 PT Individual Time Calculation (min): 25 min   Short Term Goals: Week 1:  PT Short Term Goal 1 (Week 1): Pt will consistently transfer to [facility] with min assist PT Short Term Goal 2 (Week 1): Pt will ambulate 4f with min assist PT Short Term Goal 3 (Week 1): Pt will propell WC through rehab unit x 1565fwithout assist from PT. PT Short Term Goal 4 (Week 1): Bed mobility with min assist and LRAD  Skilled Therapeutic Interventions/Progress Updates:   Pt received sitting in recliner and agreeable to PT. .PMarland Kitchen performed stand pivot [facility] assist for safety and min cues for WC parts management to ensure safety of leg rest placement. WC mobility through hall without cues or assist from PT to and from orthogym 2x 25032fCar transfer to simulate access to back seat on driver side with min assist for RLE management. Cues to pt to instruct transfer when performed by family to allow safe access to car while bledsoe brace in place. Patient returned to room and performed stand pivot to recliner with supervision assist. Pt left sitting in recliner with call bell in reach and all needs met.         Therapy Documentation Precautions:  Precautions Precautions: Fall Precaution Comments: LLE wound vac Required Braces or Orthoses: Other Brace Other Brace: hinged knee brace locked in extension Restrictions Weight Bearing Restrictions: Yes RLE Weight Bearing: Weight bearing as tolerated LLE Weight Bearing: Non weight bearing Other Position/Activity Restrictions: No ROM L knee x2 weeks from sx on 4/19; unrestricted ROM RLE    Vital Signs: Therapy Vitals Pulse Rate: 79 BP: (!) 150/81 Patient Position (if appropriate): Sitting Pain: Pain Assessment Pain Scale: 0-10 Pain Score: 6  Pain Type: Surgical  pain Pain Location: Leg(rib cage) Pain Orientation: Right;Left Pain Descriptors / Indicators: Aching;Throbbing Pain Frequency: Constant Pain Onset: On-going Patients Stated Pain Goal: 5 Pain Intervention(s): Medication (See eMAR)    Therapy/Group: Individual Therapy  AusLorie Phenix28/2021, 10:56 AM

## 2020-03-03 NOTE — Patient Care Conference (Signed)
Inpatient RehabilitationTeam Conference and Plan of Care Update Date: 03/03/2020   Time: 10:00 AM    Patient Name: Cameron Velasquez      Medical Record Number: [id number]  Date of Birth: 11/08/1955 Sex: Male         Room/Bed: 4W17C/4W17C-01 Payor Info: Payor: MEDICARE / Plan: MEDICARE PART A AND B / Product Type: *No Product type* /    Admit Date/Time:  02/26/2020  2:22 PM  Primary Diagnosis:  Closed fracture of left tibial plateau  Patient Active Problem List   Diagnosis Date Noted  . Closed fracture of left tibial plateau 02/26/2020  . Left tibial fracture 02/16/2020  . Leukocytosis 02/16/2020  . Essential hypertension 02/16/2020  . Diabetes mellitus type 2, controlled ([place]) 02/16/2020  . Fall from ladder 02/16/2020    Expected Discharge Date: Expected Discharge Date: 03/05/20  Team Members Present: Physician leading conference: Dr. Alysia Penna Care Coodinator Present: Nestor Lewandowsky, RN, BSN, CRRN;Genie Cleotha Whalin, RN, MSN;Christina Sampson Goon, [place] Nurse Present: Other (comment)(Marie Poynter, RN) PT Present: Barrie Folk, PT OT Present: Darleen Crocker, OT SLP Present: Jettie Booze, CF-SLP PPS Coordinator present : Ileana Ladd, Burna Mortimer, SLP     Current Status/Progress Goal Weekly Team Focus  Bowel/Bladder   Continent of bowel and bladder. Urine is yellow and clear. Last bm 4.27.21  Maintain continence.  Offer toileting q2-3h throughout shift.   Swallow/Nutrition/ Hydration             ADL's   CGA ambulatory toilet transfer using RW, Min A bathing + LB dressing, Min A toileting  Mod I overall  D/c planning, adaptive self care skills, balance, AE training   Mobility   minA bed mobility, CGA transfers, CGA gait 10ft with RW, supervision w/c mobilty  mod I w/c mobility, supervision transfers, supervision gait up to 60 ft  bed mobility, transfers, w/c management   Communication             Safety/Cognition/ Behavioral Observations            Pain   c/o  pain to bilateral lower extremities  Remain free of pain.  Monitor for s/s of pain and treat as needed.   Skin   Skin is warm. dry, ad intact. Incision with sutures intact right lower extremity. Lft lower extremity incision covered by wound vac. 59ml drainage.  Maintain skin integrity and prevent infection.  Continue wound care orders.    Rehab Goals Patient on target to meet rehab goals: Yes *See Care Plan and progress notes for long and short-term goals.     Barriers to Discharge  Current Status/Progress Possible Resolutions Date Resolved   Nursing                  PT                    OT                  SLP                SW Decreased caregiver support;Home environment access/layout;Wound Care 1 level- 4 step entry to home; wife unable to assist Ramp for entrance is scheduled for installation 03/06/20          Discharge Planning/Teaching Needs:  Home with son  Transfers, dressing changes, wound care, medications, etc   Team Discussion: MD pain, weaning meds, appetite good, has wound vac, monitoring BP, monitoring Hgb.  RN norco/tramadol for pain, BM yesterday, BS  controlled, ice to leg with relief.  OT S level ADLs, practiced in ADL apt, ramp being installed, mod I goals.  PT mod I bed, transfers mod I, mod I w/c mobility, amb 45' CGA/close S.   Revisions to Treatment Plan: N/A     Medical Summary Current Status: Pain control is good, right lower extremity wound healing well, left lower extremity still with wound VAC. Weekly Focus/Goal: Weaning pain medications  Barriers to Discharge: Medical stability;Wound care   Possible Resolutions to Barriers: Continue rehabilitation program, Ortho follow-up for wound left lower limb   Continued Need for Acute Rehabilitation Level of Care: The patient requires daily medical management by a physician with specialized training in physical medicine and rehabilitation for the following reasons: Direction of a multidisciplinary physical  rehabilitation program to maximize functional independence : Yes Medical management of patient stability for increased activity during participation in an intensive rehabilitation regime.: Yes Analysis of laboratory values and/or radiology reports with any subsequent need for medication adjustment and/or medical intervention. : Yes   I attest that I was present, lead the team conference, and concur with the assessment and plan of the team.   Trish Mage 03/03/2020, 2:08 PM   Team conference was held via web/ teleconference due to COVID - 19

## 2020-03-03 NOTE — Progress Notes (Addendum)
[facility] PHYSICAL MEDICINE & REHABILITATION PROGRESS NOTE   Subjective/Complaints:  Discussed pain meds, takes tramadol chronically Discussed lowering dose of hydrocodone,  Refusing Juven [per RN d/t nausea  ROS: Patient denies nausea vomiting diarrhea constipation Objective:   No results found. Recent Labs    03/01/20 0644  WBC 8.7  HGB 8.8*  HCT 28.1*  PLT 357   Recent Labs    03/01/20 0644  NA 141  K 3.7  CL 104  CO2 27  GLUCOSE 162*  BUN 16  CREATININE 0.82  CALCIUM 8.9    Intake/Output Summary (Last 24 hours) at 03/03/2020 0829 Last data filed at 03/03/2020 0549 Gross per 24 hour  Intake 760 ml  Output 675 ml  Net 85 ml     Physical Exam: Vital Signs Blood pressure (!) 150/81, pulse 79, temperature 98.2 F (36.8 C), temperature source Oral, resp. rate 18, height 5\' 11"  (1.803 m), weight 129.3 kg, SpO2 100 %.    Constitutional: No distress . Vital signs reviewed.   General: No acute distress Mood and affect are appropriate Heart: Regular rate and rhythm no rubs murmurs or extra sounds Lungs: Clear to auscultation, breathing unlabored, no rales or wheezes Abdomen: Positive bowel sounds, soft nontender to palpation, nondistended Extremities: No clubbing, cyanosis, or edema Skin: Right ant shin sutures CDI, LLE ACE wrapped with wound vac   Neurologic: Cranial nerves II through XII intact, motor strength is 5/5 in bilateral deltoid, bicep, tricep, grip,R hip flexor, knee extensors, ankle dorsiflexor and plantar flexor Left LE with wound vac ace wrap, 4- Knee ex , limited ankle DF/PF  Musculoskeletal: Limited range of motion in left ankle left knee in knee orthosis     Assessment/Plan: 1. Functional deficits secondary to polytrauma which require 3+ hours per day of interdisciplinary therapy in a comprehensive inpatient rehab setting.  Physiatrist is providing close team supervision and 24 hour management of active medical problems listed  below.  Physiatrist and rehab team continue to assess barriers to discharge/monitor patient progress toward functional and medical goals  Care Tool:  Bathing    Body parts bathed by patient: Right arm, Left arm, Chest, Abdomen, Front perineal area, Right upper leg, Left upper leg, Face, Buttocks   Body parts bathed by helper: Buttocks Body parts n/a: Left lower leg   Bathing assist Assist Level: Maximal Assistance - Patient 24 - 49%     Upper Body Dressing/Undressing Upper body dressing   What is the patient wearing?: Hospital gown only    Upper body assist Assist Level: Supervision/Verbal cueing    Lower Body Dressing/Undressing Lower body dressing      What is the patient wearing?: Underwear/pull up, Pants     Lower body assist Assist for lower body dressing: Moderate Assistance - Patient 50 - 74%     Toileting Toileting Toileting Activity did not occur and hygiene only): N/A (no void or bm)  Toileting assist Assist for toileting: Minimal Assistance - Patient > 75%     Transfers Chair/bed transfer  Transfers assist     Chair/bed transfer assist level: Contact Guard/Touching assist     Locomotion Ambulation   Ambulation assist      Assist level: Contact Guard/Touching assist Assistive device: Walker-rolling Max distance: 10'   Walk 10 feet activity   Assist     Assist level: Contact Guard/Touching assist Assistive device: Walker-rolling   Walk 50 feet activity   Assist Walk 50 feet with 2 turns activity did not occur:  Safety/medical concerns  Assist level: Contact Guard/Touching assist Assistive device: Walker-rolling    Walk 150 feet activity   Assist Walk 150 feet activity did not occur: Safety/medical concerns         Walk 10 feet on uneven surface  activity   Assist Walk 10 feet on uneven surfaces activity did not occur: Safety/medical concerns         Wheelchair     Assist Will patient use  wheelchair at discharge?: Yes Type of Wheelchair: Manual    Wheelchair assist level: Supervision/Verbal cueing Max wheelchair distance: 150    Wheelchair 50 feet with 2 turns activity    Assist        Assist Level: Supervision/Verbal cueing   Wheelchair 150 feet activity     Assist      Assist Level: Supervision/Verbal cueing   Blood pressure (!) 150/81, pulse 79, temperature 98.2 F (36.8 C), temperature source Oral, resp. rate 18, height 5\' 11"  (1.803 m), weight 129.3 kg, SpO2 100 %.  Medical Problem List and Plan: 1.  Impaired function, ADLs, and mobility secondary to polytrauma- falling from 15 ft ladder- L tibial plateau fx- s/pp ex-fix then closed reduction- is NWB on LLE- laceration RLE- WBAT; and s/p chest tube for tension pneumothorax.   wound VAC for LLE for now.              -patient may  Shower once VAC is removed Per ortho PA no great hurry to d/c wound vac LLE              -ELOS/Goals: 18-21 days- mod I/supervision   --Continue CIR therapies including PT, OT  2.  Antithrombotics: -DVT/anticoagulation:  Pharmaceutical: Lovenox             -antiplatelet therapy: N/a 3. Pain Management: Vicodin prn effective.- alternating with muscle relaxant- will continue   -We will start reducing Vicodin dosing and encourage patient to increase tramadol D/C norco 7.5 and use Norco 5mg  instead  4. Mood: LCSW to follow for evaluation and support.              -antipsychotic agents: N/A 5. Neuropsych: This patient is capable of making decisions on his own behalf. 6. Skin/Wound Care: Keep wound clean and dry--monitor for healing.   -4/26 pt with prevena on left tibial wound. No drainage in canister. Spoke to ortho who wants it to remain on for now. They'll follow up with it. Will d/c Juven, appetite good , pt taking prostat  7. Fluids/Electrolytes/Nutrition: Monitor I/O. Appetite has been good. Check lytes in am.. 8. T2DM: Will continue to monitor BS ac/hs and continue  SSI. Resume metformin --family to bring in Trulicity--weekly on Sun. Will resume amaryl as indicated. CBG (last 3)  Recent Labs    03/02/20 1641 03/02/20 2046 03/03/20 0548  GLUCAP 133* 157* 144*  controlled 4/28  9. HTN: Monitor BP tid--continue Lisinopril daily. Titrate for better control  Vitals:   03/03/20 0546 03/03/20 0755  BP: (!) 143/92 (!) 150/81  Pulse: 72 79  Resp: 18   Temp: 98.2 F (36.8 C)   SpO2: 100%   Fair control on Zestril 10. Leukocytosis: wbc's down to 8.7 4/26  11. ABLA: Stable. Add iron supplement. hgb up to 8.8 4/26 12. Right PTX: Resolved. Continue IS. CT has been removed     LOS: 6 days A FACE TO FACE EVALUATION WAS PERFORMED  Charlett Blake 03/03/2020, 8:29 AM

## 2020-03-03 NOTE — Progress Notes (Signed)
Physical Therapy Session Note  Patient Details  Name: Cameron Velasquez MRN: [id number] Date of Birth: [birth year]  Today's Date: 03/03/2020 PT Individual Time: 0805-0900and 1450-1530 PT Individual Time Calculation (min): 55 min anf 40 min  Short Term Goals: Week 1:  PT Short Term Goal 1 (Week 1): Pt will consistently transfer to [facility] with min assist PT Short Term Goal 2 (Week 1): Pt will ambulate 27f with min assist PT Short Term Goal 3 (Week 1): Pt will propell WC through rehab unit x 1537fwithout assist from PT. PT Short Term Goal 4 (Week 1): Bed mobility with min assist and LRAD  Skilled Therapeutic Interventions/Progress Updates: Tx1:  Pt presented in bed with nsg present agreeable to therapy. Pt states pain 5/10 with pt just receiving pain meds. Pt performed bed mobility with supervision and pt using brace to manage LLE. Pt donned all clothes with set up and performed STS from lowered bed (22") with supervision and pulled pants over hips without assist. Pt then transferred to w/c supervision level and was able to place leg rests supervision. Pt propelled to rehab gym mod I and performed ambulatory transfer to high/low mat. Performed STS from varying levels starting at 20in lowering down to 18in. Pt was able to perform STS while maintaining NWB precautions from all levels with supervision A. Pt was able to perform STS from 18in standard chair both without and without armrests with supervision. Pt ambulated [id number] RW and CGA demonstrating good R foot clearance throughout. Once completed pt propelled back to room and performed ambulatory transfer back to recliner. Pt left in care of NT as NT was changing bed to set chair alarm.   Tx2: Pt presented in recliner agreeable to therapy. Pt states pain 5-6/10 but recently medicated. Pt requesting to use bathroom. Performed ambulatory transfer to bathroom with overall supervision. Performed toilet transfers with supervision and was able to perform  peri-care with supervision (+BM). Pt then ambulated to w/c and was able to don ELR's independently. Pt then maneuvered w/c to sink and performed hand hygiene. Pt then propelled to day room and participated in standing tolerance playing a game of Wii bowling. Pt was able to tolerate standing for all 10 frames. Pt also was able to manage Wii controller performing lateral reaches to increase swing velocity. Pt returned to room once completed and performed ambulatory transfer back to recliner. Pt was able to re-adjust DonJoy to better align with knee. Pt left in recliner at end of session and left with seat alarm on, call bell within reach and needs met.      Therapy Documentation Precautions:  Precautions Precautions: Fall Precaution Comments: LLE wound vac Required Braces or Orthoses: Other Brace Other Brace: hinged knee brace locked in extension Restrictions Weight Bearing Restrictions: Yes RLE Weight Bearing: Weight bearing as tolerated LLE Weight Bearing: Non weight bearing Other Position/Activity Restrictions: No ROM L knee x2 weeks from sx on 4/19; unrestricted ROM RLE General:   Vital Signs:  Pain: Pain Assessment Pain Scale: 0-10 Pain Score: 7  Pain Type: Surgical pain Pain Location: Rib cage(leg) Pain Orientation: Right;Left Pain Descriptors / Indicators: Aching;Throbbing Pain Frequency: Constant Pain Onset: On-going Patients Stated Pain Goal: 5 Pain Intervention(s): Medication (See eMAR) Mobility:   Locomotion :    Trunk/Postural Assessment :    Balance:   Exercises:   Other Treatments:      Therapy/Group: Individual Therapy  Ashanna Heinsohn 03/03/2020, 12:44 PM

## 2020-03-03 NOTE — Plan of Care (Signed)
  Problem: Sit to Stand Goal: LTG:  Patient will perform sit to stand with assistance level (PT) Description: LTG:  Patient will perform sit to stand with assistance level (PT) Flowsheets (Taken 03/03/2020 1112) LTG: PT will perform sit to stand in preparation for functional mobility with assistance level: (upgraded due to progress.) Independent with assistive device Note: upgraded due to progress.   Problem: RH Bed Mobility Goal: LTG Patient will perform bed mobility with assist (PT) Description: LTG: Patient will perform bed mobility with assistance, with/without cues (PT). Flowsheets (Taken 03/03/2020 1112) LTG: Pt will perform bed mobility with assistance level of: Independent with assistive device  Note: upgraded due to progress.   Problem: RH Bed to Chair Transfers Goal: LTG Patient will perform bed/chair transfers w/assist (PT) Description: LTG: Patient will perform bed to chair transfers with assistance (PT). Flowsheets (Taken 03/03/2020 1112) LTG: Pt will perform Bed to Chair Transfers with assistance level: Independent with assistive device  Note: upgraded due to progress.   Problem: RH Car Transfers Goal: LTG Patient will perform car transfers with assist (PT) Description: LTG: Patient will perform car transfers with assistance (PT). Flowsheets (Taken 03/03/2020 1112) LTG: Pt will perform car transfers with assist:: (upgraded due to progress.) Contact Guard/Touching assist Note: upgraded due to progress.   Problem: RH Ambulation Goal: LTG Patient will ambulate in controlled environment (PT) Description: LTG: Patient will ambulate in a controlled environment, # of feet with assistance (PT). Flowsheets (Taken 03/03/2020 1112) LTG: Pt will ambulate in controlled environ  assist needed:: Independent with assistive device LTG: Ambulation distance in controlled environment: upgraded due to progress. Goal: LTG Patient will ambulate in home environment (PT) Description: LTG: Patient  will ambulate in home environment, # of feet with assistance (PT). Flowsheets Taken 03/03/2020 1112 LTG: Pt will ambulate in home environ  assist needed:: Independent with assistive device Taken 02/27/2020 1220 LTG: Ambulation distance in home environment: 40ft with LRAD   Problem: RH Stairs Goal: LTG Patient will ambulate up and down stairs w/assist (PT) Description: LTG: Patient will ambulate up and down # of stairs with assistance (PT) Outcome: Not Applicable Note: N/a pt will have ramp at d/c

## 2020-03-03 NOTE — Progress Notes (Signed)
Occupational Therapy Session Note  Patient Details  Name: Cameron Velasquez MRN: [id number] Date of Birth: 03/08/1956  Today's Date: 03/03/2020 OT Individual Time: 1300-1413 OT Individual Time Calculation (min): 73 min    Short Term Goals: Week 1:  OT Short Term Goal 1 (Week 1): Pt will complete toileting with Mod A OT Short Term Goal 2 (Week 1): Pt will complete LB dressing with supervision for dynamic standing balance OT Short Term Goal 3 (Week 1): Pt will don footwear with supervision assist with AE as needed  Skilled Therapeutic Interventions/Progress Updates:    Upon entering the room, pt seated in recliner chair with no c/o pain this session and is very excited over upcoming discharge date. Pt requesting to go to kitchen for problem solving for upcoming discharge. Pt transferred into wheelchair with supervision and use of RW. Pt managed leg rests independently with increased time during this session. Pt also able to place RW onto LEs and propel wheelchair to intended location. Pt propelled wheelchair at mod I level 150' to ADL apartment. Pt maneuvering wheelchair in tight spaces with increased time. OT educated pt on kitchen mobility with RW and also from wheelchair level for energy conservation. Pt returning demonstrations with supervision overall. Pt returning back to room in same manner as above and returned to wheelchair with supervision overall. Call bell and all needed items within reach upon exiting the room.    Therapy Documentation Precautions:  Precautions Precautions: Fall Precaution Comments: LLE wound vac Required Braces or Orthoses: Other Brace Other Brace: hinged knee brace locked in extension Restrictions Weight Bearing Restrictions: Yes RLE Weight Bearing: Weight bearing as tolerated LLE Weight Bearing: Non weight bearing Other Position/Activity Restrictions: No ROM L knee x2 weeks from sx on 4/19; unrestricted ROM RLE Vital Signs: Therapy Vitals Temp: 98 F (36.7  C) Temp Source: Oral Pulse Rate: 90 Resp: 20 BP: (!) 170/92 Patient Position (if appropriate): Sitting Oxygen Therapy SpO2: 100 % O2 Device: Room Air Pain: Pain Assessment Pain Scale: 0-10 Pain Score: 7  Pain Type: Surgical pain Pain Location: Rib cage(leg) Pain Orientation: Right;Left Pain Descriptors / Indicators: Aching;Throbbing Pain Frequency: Constant Patients Stated Pain Goal: 5 Pain Intervention(s): Medication (See eMAR) ADL: ADL Eating: Not assessed Grooming: Setup Where Assessed-Grooming: Sitting at sink Upper Body Bathing: Setup Where Assessed-Upper Body Bathing: Edge of bed Lower Body Bathing: Minimal assistance Where Assessed-Lower Body Bathing: Edge of bed Upper Body Dressing: Setup Where Assessed-Upper Body Dressing: Edge of bed Lower Body Dressing: Moderate assistance Where Assessed-Lower Body Dressing: Edge of bed Toileting: Maximal assistance Where Assessed-Toileting: Teacher, adult education: Furniture conservator/restorer Method: Ambulating(with RW) Acupuncturist: Raised toilet seat Tub/Shower Transfer: Not assessed   Therapy/Group: Individual Therapy  Alen Bleacher 03/03/2020, 2:14 PM

## 2020-03-04 ENCOUNTER — Inpatient Hospital Stay (HOSPITAL_COMMUNITY): Payer: Medicare Other | Admitting: Occupational Therapy

## 2020-03-04 ENCOUNTER — Inpatient Hospital Stay (HOSPITAL_COMMUNITY): Payer: Medicare Other

## 2020-03-04 ENCOUNTER — Inpatient Hospital Stay (HOSPITAL_COMMUNITY): Payer: Medicare Other | Admitting: Physical Therapy

## 2020-03-04 LAB — GLUCOSE, CAPILLARY
Glucose-Capillary: 136 mg/dL — ABNORMAL HIGH (ref 70–99)
Glucose-Capillary: 107 mg/dL — ABNORMAL HIGH (ref 70–99)
Glucose-Capillary: 125 mg/dL — ABNORMAL HIGH (ref 70–99)
Glucose-Capillary: 153 mg/dL — ABNORMAL HIGH (ref 70–99)

## 2020-03-04 MED ORDER — DOCUSATE SODIUM 100 MG PO CAPS: 100.0000 mg | ORAL_CAPSULE | Freq: Two times a day (BID) | ORAL | 0 refills | Status: AC

## 2020-03-04 MED ORDER — TRAMADOL HCL 50 MG PO TABS: 50.0000 mg | ORAL_TABLET | Freq: Four times a day (QID) | ORAL | 0 refills | Status: AC | PRN

## 2020-03-04 MED ORDER — ERGOCALCIFEROL 1.25 MG (50000 UT) PO CAPS: 50000.0000 [IU] | ORAL_CAPSULE | ORAL | 0 refills | Status: AC

## 2020-03-04 MED ORDER — METFORMIN HCL ER 500 MG PO TB24: 1000.0000 mg | ORAL_TABLET | Freq: Two times a day (BID) | ORAL | Status: AC

## 2020-03-04 MED ORDER — METHOCARBAMOL 500 MG PO TABS: 500.0000 mg | ORAL_TABLET | Freq: Four times a day (QID) | ORAL | 0 refills | Status: AC | PRN

## 2020-03-04 MED ORDER — ENOXAPARIN SODIUM 40 MG/0.4ML ~~LOC~~ SOLN: 40.0000 mg | SUBCUTANEOUS | 0 refills | Status: AC

## 2020-03-04 MED ORDER — POLYETHYLENE GLYCOL 3350 17 G PO PACK: 17.0000 g | PACK | Freq: Every day | ORAL | 0 refills | Status: AC

## 2020-03-04 MED ORDER — POLYSACCHARIDE IRON COMPLEX 150 MG PO CAPS: 150.0000 mg | ORAL_CAPSULE | Freq: Every day | ORAL | 0 refills | Status: AC

## 2020-03-04 MED ORDER — TRAMADOL HCL 50 MG PO TABS: 50.0000 mg | ORAL_TABLET | Freq: Every day | ORAL | 0 refills | Status: DC

## 2020-03-04 MED ORDER — VITAMIN D3 25 MCG PO TABS: 2000.0000 [IU] | ORAL_TABLET | Freq: Two times a day (BID) | ORAL | 0 refills | Status: AC

## 2020-03-04 MED ORDER — ACETAMINOPHEN 500 MG PO TABS: 500.0000 mg | ORAL_TABLET | Freq: Two times a day (BID) | ORAL | 0 refills | Status: AC

## 2020-03-04 MED ORDER — ASCORBIC ACID 500 MG PO TABS: 500.0000 mg | ORAL_TABLET | Freq: Every day | ORAL | 0 refills | Status: AC

## 2020-03-04 MED ORDER — ADULT MULTIVITAMIN W/MINERALS CH: 1.0000 | ORAL_TABLET | Freq: Every day | ORAL | Status: AC

## 2020-03-04 MED ORDER — HYDROCODONE-ACETAMINOPHEN 5-325 MG PO TABS: 1.0000 | ORAL_TABLET | Freq: Four times a day (QID) | ORAL | 0 refills | Status: AC | PRN

## 2020-03-04 NOTE — Progress Notes (Addendum)
Team Conference Report to Patient/Family  Team Conference discussion was reviewed with the patient, including goals, any changes in plan of care and target discharge date.  Patient expressed understanding and is in agreement.  The patient has a target discharge date of 03/05/20. Ramp was installed by [employer]. Script for ramp given to patient. Noted no family education needed before discharge.HH services confirmed with [facility]. Feels like he is ready to go home.  Chana Bode B 03/04/2020, 11:52 AM

## 2020-03-04 NOTE — Progress Notes (Addendum)
PHYSICAL MEDICINE & REHABILITATION PROGRESS NOTE   Subjective/Complaints:  Pt has questions about RLE suture removal  LLE wound vac, ability to shower Left knee orthosis flexion WB status LLE   Pain in ribs on RIght with arm elevation   ROS: Patient denies nausea vomiting diarrhea constipation Objective:   No results found. No results for input(s): WBC, HGB, HCT, PLT in the last 72 hours. No results for input(s): NA, K, CL, CO2, GLUCOSE, BUN, CREATININE, CALCIUM in the last 72 hours.  Intake/Output Summary (Last 24 hours) at 03/04/2020 0756 Last data filed at 03/04/2020 0512 Gross per 24 hour  Intake 948 ml  Output 1860 ml  Net -912 ml     Physical Exam: Vital Signs Blood pressure 136/74, pulse 72, temperature 97.8 F (36.6 C), temperature source Oral, resp. rate 18, height 5\' 11"  (1.803 m), weight 129.3 kg, SpO2 99 %.    Constitutional: No distress . Vital signs reviewed.    General: No acute distress Mood and affect are appropriate Heart: Regular rate and rhythm no rubs murmurs or extra sounds Lungs: Clear to auscultation, breathing unlabored, no rales or wheezes Abdomen: Positive bowel sounds, soft nontender to palpation, nondistended Extremities: No clubbing, cyanosis, or edema Skin: No evidence of breakdown, no evidence of rash Neurologic: Cranial nerves II through XII intact, motor strength is 5/5 in bilateral deltoid, bicep, tricep, grip, hip flexor, knee extensors, ankle dorsiflexor and plantar flexor Sensory exam normal sensation to light touch and proprioception in bilateral upper and lower extremities Cerebellar exam normal finger to nose to finger as well as heel to shin in bilateral upper and lower extremities Musculoskeletal: Left knee in ext , Left heel cord tight , Right rib pai with Right shoulder abduction  Extremities: No clubbing, cyanosis, or edema Skin: Right ant shin sutures CDI, LLE ACE wrapped with wound vac   Neurologic: Cranial  nerves II through XII intact, motor strength is 5/5 in bilateral deltoid, bicep, tricep, grip,R hip flexor, knee extensors, ankle dorsiflexor and plantar flexor Left LE with wound vac ace wrap, 4- Knee ex , limited ankle DF/PF  Musculoskeletal: Limited range of motion in left ankle left knee in knee orthosis     Assessment/Plan: 1. Functional deficits secondary to polytrauma which require 3+ hours per day of interdisciplinary therapy in a comprehensive inpatient rehab setting.  Physiatrist is providing close team supervision and 24 hour management of active medical problems listed below.  Physiatrist and rehab team continue to assess barriers to discharge/monitor patient progress toward functional and medical goals  Care Tool:  Bathing    Body parts bathed by patient: Right arm, Left arm, Chest, Abdomen, Front perineal area, Right upper leg, Left upper leg, Face, Buttocks   Body parts bathed by helper: Buttocks Body parts n/a: Left lower leg   Bathing assist Assist Level: Maximal Assistance - Patient 24 - 49%     Upper Body Dressing/Undressing Upper body dressing   What is the patient wearing?: Hospital gown only    Upper body assist Assist Level: Supervision/Verbal cueing    Lower Body Dressing/Undressing Lower body dressing      What is the patient wearing?: Underwear/pull up, Pants     Lower body assist Assist for lower body dressing: Moderate Assistance - Patient 50 - 74%     Toileting Toileting Toileting Activity did not occur (Clothing management and hygiene only): N/A (no void or bm)  Toileting assist Assist for toileting: Minimal Assistance - Patient > 75%  Transfers Chair/bed transfer  Transfers assist     Chair/bed transfer assist level: Supervision/Verbal cueing     Locomotion Ambulation   Ambulation assist      Assist level: Contact Guard/Touching assist Assistive device: Walker-rolling Max distance: 37ft   Walk 10 feet  activity   Assist     Assist level: Contact Guard/Touching assist Assistive device: Walker-rolling   Walk 50 feet activity   Assist Walk 50 feet with 2 turns activity did not occur: Safety/medical concerns  Assist level: Contact Guard/Touching assist Assistive device: Walker-rolling    Walk 150 feet activity   Assist Walk 150 feet activity did not occur: Safety/medical concerns  Assist level: Contact Guard/Touching assist      Walk 10 feet on uneven surface  activity   Assist Walk 10 feet on uneven surfaces activity did not occur: Safety/medical concerns         Wheelchair     Assist Will patient use wheelchair at discharge?: Yes Type of Wheelchair: Manual    Wheelchair assist level: Independent Max wheelchair distance: 145ft    Wheelchair 50 feet with 2 turns activity    Assist        Assist Level: Independent   Wheelchair 150 feet activity     Assist      Assist Level: Independent   Blood pressure 136/74, pulse 72, temperature 97.8 F (36.6 C), temperature source Oral, resp. rate 18, height 5\' 11"  (1.803 m), weight 129.3 kg, SpO2 99 %.  Medical Problem List and Plan: 1.  Impaired function, ADLs, and mobility secondary to polytrauma- falling from 15 ft ladder- L tibial plateau fx- s/pp ex-fix then closed reduction- is NWB on LLE- laceration RLE- WBAT; and s/p chest tube for tension pneumothorax.   wound VAC for LLE for now.              -patient may  Shower once VAC is removed Per ortho PA no great hurry to d/c wound vac LLE              -ELOS/Goals: 18-21 days- mod I/supervision   --Continue CIR therapies including PT, OT  2.  Antithrombotics: -DVT/anticoagulation:  Pharmaceutical: Lovenox             -antiplatelet therapy: N/a 3. Pain Management: Vicodin prn effective.- alternating with muscle relaxant- will continue   -We will start reducing Vicodin dosing and encourage patient to increase tramadol D/C norco 7.5 and use Norco  5mg  instead  4. Mood: LCSW to follow for evaluation and support.              -antipsychotic agents: N/A 5. Neuropsych: This patient is capable of making decisions on his own behalf. 6. Skin/Wound Care: Keep wound clean and dry--monitor for healing.   -4/26 pt with prevena on left tibial wound. No drainage in canister. Spoke to ortho who wants it to remain on for now. They'll follow up with it. Will d/c Juven, appetite good , pt taking prostat  7. Fluids/Electrolytes/Nutrition: Monitor I/O. Appetite has been good. Check lytes in am.. 8. T2DM: Will continue to monitor BS ac/hs and continue SSI. Resume metformin --family to bring in Trulicity--weekly on Sun. Will resume amaryl as indicated. CBG (last 3)  Recent Labs    03/03/20 1659 03/03/20 2101 03/04/20 0542  GLUCAP 136* 171* 136*  controlled 4/29 9. HTN: Monitor BP tid--continue Lisinopril daily. Titrate for better control  Vitals:   03/03/20 2010 03/04/20 0509  BP: 134/75 136/74  Pulse: 78 72  Resp: 18 18  Temp: 98.4 F (36.9 C) 97.8 F (36.6 C)  SpO2: 100% 99%  good control on Zestril 10. Leukocytosis: wbc's down to 8.7 4/26  11. ABLA: Stable. Add iron supplement. hgb up to 8.8 4/26 12. Right PTX: Resolved. Continue IS. CT has been removed   13.  Ortho f/u to answer several questions- have left message with Dr Magdalene Patricia office  Pt has questions about RLE suture removal  LLE wound vac, ability to shower Left knee orthosis flexion WB status LLE   LOS: 7 days A FACE TO FACE EVALUATION WAS PERFORMED  Cameron Velasquez 03/04/2020, 7:56 AM

## 2020-03-04 NOTE — Progress Notes (Signed)
Occupational Therapy Discharge Summary  Patient Details  Name: Cameron Velasquez MRN: [id number] Date of Birth: 1956/08/01  Today's Date: 03/04/2020 OT Individual Time: 1015-1100 OT Individual Time Calculation (min): 45 min    Skilled Tx. Pt received in recliner reporting completed BADLs this morning with MOD I with RN. OT observes toilet transfer/CM with MOD I at [facility] ambulatory level as pt would at home. Pt and OT discuss at length IADL adaptations, energy conservation activities and potential therex once able to bend L knee (with guidance from surgical team and PT prior to completing exercise after 14 day period). Pt transfers into w/c with MOD I using RW and gathers items and packs up suitcase in prep for d/c. Reviewed wrapping LE in garbage back in prep for shower and provided needed supplies at home. Exited session with pt seated in bed, exit alarm on and call light in reach   Patient has met 8 of 8 long term goals due to improved activity tolerance, improved balance, postural control, ability to compensate for deficits and functional use of  RIGHT upper and RIGHT lower extremity.  Patient to discharge at overall Modified Independent level.  Pt is very able to direct any care needs and has made tremenous progress d/t hard work over the duration of his stay.  Reasons goals not met: n/a  Recommendation:  Patient will benefit from ongoing skilled OT services in home health setting to continue to advance functional skills in the area of BADL and iADL.  Equipment: No equipment provided  Reasons for discharge: treatment goals met  Patient/family agrees with progress made and goals achieved: Yes  OT Discharge Precautions/Restrictions  Precautions Precautions: Fall Required Braces or Orthoses: Other Brace Other Brace: hinged knee brace locked in extension Restrictions Weight Bearing Restrictions: Yes RLE Weight Bearing: Weight bearing as tolerated LLE Weight Bearing: Non weight  bearing Other Position/Activity Restrictions: No ROM L knee x2 weeks from sx on 4/19; unrestricted ROM RLE General   Vital Signs Therapy Vitals Temp: 97.8 F (36.6 C) Temp Source: Oral Pulse Rate: 72 Resp: 18 BP: 136/74 Patient Position (if appropriate): Lying Oxygen Therapy SpO2: 99 % O2 Device: Room Air Pain   ADL ADL Eating: Independent Grooming: Independent Where Assessed-Grooming: Sitting at sink Upper Body Bathing: Independent Where Assessed-Upper Body Bathing: Edge of bed Lower Body Bathing: Independent Where Assessed-Lower Body Bathing: Edge of bed Upper Body Dressing: Independent Where Assessed-Upper Body Dressing: Edge of bed Lower Body Dressing: Independent Where Assessed-Lower Body Dressing: Edge of bed Toileting: Independent Where Assessed-Toileting: Glass blower/designer: Programmer, applications Method: Ambulating(with RW) Science writer: Raised toilet seat Tub/Shower Transfer: Not assessed Vision Baseline Vision/History: Wears glasses Wears Glasses: At all times Patient Visual Report: No change from baseline Vision Assessment?: No apparent visual deficits Perception  Perception: Within Functional Limits Praxis Praxis: Intact Cognition Overall Cognitive Status: Within Functional Limits for tasks assessed Arousal/Alertness: Awake/alert Orientation Level: Oriented X4 Sustained Attention: Appears intact Memory: Appears intact Sensation Sensation Light Touch: Appears Intact Proprioception: Appears Intact Coordination Gross Motor Movements are Fluid and Coordinated: Yes Fine Motor Movements are Fluid and Coordinated: Yes Heel Shin Test: unable to perform from Bledsoe brace Motor  Motor Motor - Skilled Clinical Observations: generalized weakness and pain from surgery Mobility  Bed Mobility Bed Mobility: Supine to Sit;Sit to Supine Supine to Sit: Independent Sit to Supine: Independent Transfers Sit to Stand: Independent with  assistive device  Trunk/Postural Assessment  Cervical Assessment Cervical Assessment: Within Functional Limits Thoracic Assessment Thoracic Assessment: Within  Functional Limits Lumbar Assessment Lumbar Assessment: Within Functional Limits Postural Control Postural Control: Within Functional Limits  Balance Static Sitting Balance Static Sitting - Level of Assistance: 7: Independent Dynamic Sitting Balance Dynamic Sitting - Level of Assistance: 7: Independent Static Standing Balance Static Standing - Level of Assistance: 7: Independent Extremity/Trunk Assessment RUE Assessment RUE Assessment: Within Functional Limits LUE Assessment LUE Assessment: Within Functional Limits   Tonny Branch 03/04/2020, 6:52 AM

## 2020-03-04 NOTE — Care Management (Signed)
   The overall goal for the admission was met for:   Discharge location: Home with wife and son  Length of Stay: 8 days with discharge on 03/05/20  Discharge activity level:Patient to discharge at a wheelchair level Modified Independent.    Home/community participation: Clinical research associate provided included: MD, RD, PT, OT, SLP, RN, CM, TR, Pharmacy, Neuropsych and SW  Financial Services: Medicare  Follow-up services arranged: Home Health: PT, OT and Patient/Family has no preference for HH/DME agencies  Comments (or additional information): [facility] [phone]   Patient/Family verbalized understanding of follow-up arrangements: Yes  Individual responsible for coordination of the follow-up plan: [phone]  Confirmed no DME recommended: Margarito Liner 03/04/2020    Margarito Liner

## 2020-03-04 NOTE — Progress Notes (Signed)
Occupational Therapy Session Note  Patient Details  Name: Dammon Makarewicz MRN: [id number] Date of Birth: February 11, 1956  Today's Date: 03/04/2020 OT Individual Time: 1402-1500 OT Individual Time Calculation (min): 58 min    Short Term Goals: Week 1:  OT Short Term Goal 1 (Week 1): Pt will complete toileting with Mod A OT Short Term Goal 2 (Week 1): Pt will complete LB dressing with supervision for dynamic standing balance OT Short Term Goal 3 (Week 1): Pt will don footwear with supervision assist with AE as needed  Skilled Therapeutic Interventions/Progress Updates:    Pt completed functional transfers with use of the RW for support at modified independent level.  He then rolled himself down to the ortho gym with modified independence and completed BUE strengthening with use of the UE ergonometer.  He was able to complete 15 mins consecutive exercising on level 8 resistance of Random Program setting.  RPMs were maintained at 30 and above.  Once complete he was transported to the dayroom where he discussed his current home setup with therapist and how he plans to get in the shower when approval is given, with use of his current walk-in shower and tub bench.  He also reports having multiple reachers, a kitchen that is wheelchair accessible, as well as toilet risers for use over his regular toilet.  Discussed use of the walker in the kitchen as well as he gets stronger and the need for a walker bag or basket to help with transporting times.  He reports having a basket for the regular walker as well as having a rollator if needed.  Returned to room at end of discussion with transfer back to the recliner with modified independence to complete session.  Pt able to [facility] his brace as well and re-adjust it from the recliner with the footrest elevated.    Therapy Documentation Precautions:  Precautions Precautions: Fall Precaution Comments: LLE wound vac Required Braces or Orthoses: Other Brace Other  Brace: hinged knee brace locked in extension Restrictions Weight Bearing Restrictions: Yes RLE Weight Bearing: Weight bearing as tolerated LLE Weight Bearing: Non weight bearing Other Position/Activity Restrictions: No ROM L knee x2 weeks from sx on 4/19; unrestricted ROM RLE  Pain: Pain Assessment Pain Scale: Faces Pain Score: 7  Faces Pain Scale: Hurts a little bit Pain Type: Surgical pain Pain Location: Leg Pain Orientation: Right;Left Pain Descriptors / Indicators: Discomfort Pain Onset: With Activity Pain Intervention(s): Repositioned ADL: See Care Tool Section for some details of mobility and selfcare  Therapy/Group: Individual Therapy  Anthonia Monger,Niko OTR/L 03/04/2020, 4:16 PM

## 2020-03-04 NOTE — Progress Notes (Signed)
Physical Therapy Discharge Summary  Patient Details  Name: Cameron Velasquez MRN: [id number] Date of Birth: 1956/02/07  Today's Date: 03/04/2020 PT Individual Time: 6269-4854 PT Individual Time Calculation (min): 75 min    Patient has met 10 of 10 long term goals due to improved activity tolerance, improved balance, decreased pain and ability to compensate for deficits.  Patient to discharge at a wheelchair level Modified Independent.   Patient's care partner is independent to provide the necessary physical assistance at discharge.  Reasons goals not met: All PT goals met  Recommendation:  Patient will benefit from ongoing skilled PT services in home health setting to continue to advance safe functional mobility, address ongoing impairments in balance, gait, tranfers, strength, safety, and minimize fall risk.  Equipment: No equipment provided  Reasons for discharge: treatment goals met and discharge from hospital  Patient/family agrees with progress made and goals achieved: Yes   PT treatment:  Pt received sitting on toilet and agreeable to PT. Toilet transfer with RW, no cues or assist required. PT instructed pt in Grad day assessment to measure progress toward goals. See below for details. WC mobility 3 x 239f without cues or assist, pt able manage all aspects of WC parts without aid. Car transfer training with contact guard assist from PT with pt providing instruction for proper assistance to simulate transfer with family. Bed mobility from 23 inches without assist and using LLE bledsoe brace to control LE. Pt able to pick object up from floor using reacher without assist and 1 UE support on RW. Patient returned to room and left sitting in recliner with call bell in reach and all needs met.        PT Discharge Precautions/Restrictions Precautions Precautions: Fall Required Braces or Orthoses: Other Brace Other Brace: hinged knee brace locked in extension Restrictions Weight  Bearing Restrictions: Yes RLE Weight Bearing: Weight bearing as tolerated LLE Weight Bearing: Non weight bearing Other Position/Activity Restrictions: No ROM L knee x2 weeks from sx on 4/19; unrestricted ROM RLE  Pain   denies at rest Vision/Perception  Perception Perception: Within Functional Limits Praxis Praxis: Intact  Cognition Overall Cognitive Status: Within Functional Limits for tasks assessed Arousal/Alertness: Awake/alert Orientation Level: Oriented X4 Sustained Attention: Appears intact Memory: Appears intact Sensation Sensation Light Touch: Appears Intact Proprioception: Appears Intact Coordination Gross Motor Movements are Fluid and Coordinated: Yes Fine Motor Movements are Fluid and Coordinated: Yes Heel Shin Test: unable to perform from Bledsoe brace Motor  Motor Motor: Within Functional Limits Motor - Skilled Clinical Observations: generalized weakness and pain from surgery Motor - Discharge Observations: generalized weakness and pain from surgery  Mobility Bed Mobility Bed Mobility: Supine to Sit;Sit to Supine Supine to Sit: Independent Sit to Supine: Independent Transfers Transfers: Sit to SBank of AmericaTransfers Sit to Stand: Independent with assistive device Stand Pivot Transfers: Independent with assistive device Transfer (Assistive device): Rolling walker Locomotion  Gait Ambulation: Yes Gait Assistance: Independent with assistive device Gait Distance (Feet): 70 Feet Assistive device: Rolling walker Gait Gait Pattern: Impaired Gait Pattern: Step-through pattern;Antalgic Gait velocity: NWB LLE Stairs / Additional Locomotion Stairs: No WArchitect Yes Wheelchair Assistance: Independent with aCamera operator Both upper extremities Wheelchair Parts Management: Independent Distance: 250  Trunk/Postural Assessment  Cervical Assessment Cervical Assessment: Within Functional  Limits Thoracic Assessment Thoracic Assessment: Within Functional Limits Lumbar Assessment Lumbar Assessment: Within Functional Limits Postural Control Postural Control: Within Functional Limits  Balance Static Sitting Balance Static Sitting - Level of Assistance: 7:  Independent Dynamic Sitting Balance Dynamic Sitting - Level of Assistance: 7: Independent Static Standing Balance Static Standing - Balance Support: Right upper extremity supported Static Standing - Level of Assistance: 6: Modified independent (Device/Increase time) Dynamic Standing Balance Dynamic Standing - Balance Support: Right upper extremity supported;Bilateral upper extremity supported;During functional activity Dynamic Standing - Level of Assistance: 6: Modified independent (Device/Increase time) Extremity Assessment  RUE Assessment RUE Assessment: Within Functional Limits LUE Assessment LUE Assessment: Within Functional Limits RLE Assessment RLE Assessment: Within Functional Limits General Strength Comments: grossly 5/5 LLE Assessment LLE Assessment: Exceptions to [employer] General Strength Comments: KI with bledsoe brace. grossly 3+/5 hip and ankle    Lorie Phenix 03/04/2020, 9:46 AM

## 2020-03-05 DIAGNOSIS — S82142S Displaced bicondylar fracture of left tibia, sequela: Secondary | ICD-10-CM | POA: Diagnosis not present

## 2020-03-05 DIAGNOSIS — S270XXA Traumatic pneumothorax, initial encounter: Secondary | ICD-10-CM

## 2020-03-05 DIAGNOSIS — S2241XA Multiple fractures of ribs, right side, initial encounter for closed fracture: Secondary | ICD-10-CM

## 2020-03-05 LAB — GLUCOSE, CAPILLARY: Glucose-Capillary: 139 mg/dL — ABNORMAL HIGH (ref 70–99)

## 2020-03-05 NOTE — Progress Notes (Signed)
Patient taken down by staff in wheelchair to daughters personal vehicle. Patient kept saying thank you to [facility] staff and that we were his guardian angels. Patient discharged with all personal belongings.

## 2020-03-05 NOTE — Discharge Summary (Signed)
Physician Discharge Summary  Patient ID: Cameron Velasquez MRN: [id number] DOB/AGE: 1956/07/11 64 y.o.  Admit date: 02/26/2020 Discharge date: 03/05/2020  Discharge Diagnoses:  Principal Problem:   Closed fracture of left tibial plateau Active Problems:   Essential hypertension   Diabetes mellitus type 2, controlled (HCC)   Fall from ladder   Discharged Condition: stable   Significant Diagnostic Studies: N/A  Labs:  Basic Metabolic Panel: BMP Latest Ref Rng & Units 03/01/2020 02/27/2020 02/26/2020  Glucose 70 - 99 mg/dL 191(Y) 782(N) 562(Z)  BUN 8 - 23 mg/dL 16 14 16   Creatinine 0.61 - 1.24 mg/dL 3.08 6.57  Sodium 135 - 145 mmol/L 141 139 140  Potassium 3.5 - 5.1 mmol/L 3.7 3.5 3.7  Chloride 98 - 111 mmol/L 104 107 108  CO2 22 - 32 mmol/L 27 25 25   Calcium 8.9 - 10.3 mg/dL 8.9 8.46) )    CBC: CBC Latest Ref Rng & Units 03/01/2020 02/27/2020 02/26/2020  WBC 4.0 - 10.5 K/uL 8.7 12.1(H) 11.9(H)  Hemoglobin 13.0 - 17.0 g/dL 02/29/2020) 02/28/2020) 8.4(X)  Hematocrit 39.0 - 52.0 % 28.1(L) 26.3(L) 26.7(L)  Platelets 150 - 400 K/uL 357 310 305    CBG: Recent Labs  Lab 03/04/20 0542 03/04/20 1158 03/04/20 1711 03/04/20 2047 03/05/20 0607  GLUCAP 136* 107* 125* 153* 139*    Brief HPI:   Cameron Velasquez is a 64 y.o. male with history of HTN, T2DM, GERD who was admitted on 02/16/2020 after falling 15 feet off a ladder.  He sustained left bicondylar tibial plateau fracture, open left calf wound, open left comminuted proximal tibia and acute nondisplaced fibular head fracture, large left lipohemarthrosis, right seventh rib fracture and small PTX.   He underwent I&D BLE with close reduction of left tibia and aspiration left knee hemarthrosis and placement of external fixator LLE.  He did develop a large right PTX on 413 requiring chest tube placement.  This was removed on 4/16 and follow-up x-rays is stable.  He was taken back to the OR on 04/19 for removal of external fixator and  ORIF left bicondylar tibial plateau fracture by Dr. 5/16.  Postop to be NWB BLE LLE with Bledsoe brace in place.  Prevena VAC to continue for at least a week.  Therapy was ongoing revealing functional decline.  CIR was recommended for follow-up therapy   Hospital Course: Cameron Velasquez was admitted to rehab 02/26/2020 for inpatient therapies to consist of PT and OT at least three hours five days a week. Past admission physiatrist, therapy team and rehab RN have worked together to provide customized collaborative inpatient rehab.  He was maintained on subcu Lovenox for DVT prophylaxis and is to continue on this at discharge.  Follow-up CBC showed that acute blood loss anemia is stable and reactive leukocytosis has resolved.  Pain has been managed with use of tramadol and Norco on as needed basis.  Norco was tapered to 5/325 tid prn by discharge.Blood pressures were monitored on TID basis and has been stable.  P.o. intake has been good and protein supplements were added to help promote wound healing.   His diabetes has been monitored with ac/hs CBG checks and SSI was use prn for tighter BS control.  Metformin and Trulicity were resumed.  Blood sugars have been reasonably controlled off Amaryl.   He was advised to continue monitoring blood sugars achs  basis and resume Amaryl if blood sugars start trending upwards.  Incision on right shin is healing well and sutures remain intact.  Wound VAC on left knee was removed by Ortho prior to discharge and he is to follow up in office next week for suture removal.  He is continent of bowel and bladder.  He has made gains during rehab stay and is currently at modified independent l at wheelchair evel.  He will continue to receive follow up HHPT and [place] [facility] after discharge   Rehab course: During patient's stay in rehab  team conference was held to monitor patient's progress, set goals and discuss barriers to discharge. At admission, patient required min  to max assist for ADL task and mod assist for mobility.  He has had improvement in activity tolerance, balance, postural control as well as ability to compensate for deficits. He is able to complete ADL tasks at modified independent level. He is modified independent for transfers and to ambulate 70 feet with assistive device.  Family education was completed regarding all aspects of safety and care.  Discharge disposition: 01-Home or Self Care.  Diet: Carb Modified.  Special Instructions: 1. No weight on left leg. Wear brace at all times.  2. Alternate hydrocodone with tramadol.  3. Recheck CBC in 1-2 weeks for follow up on ABLA.    Allergies as of 03/05/2020   No Known Allergies     Medication List    STOP taking these medications   glimepiride 2 MG tablet Commonly known as: AMARYL   meloxicam 15 MG tablet Commonly known as: MOBIC     TAKE these medications   acetaminophen 500 MG tablet Commonly known as: TYLENOL Take 1 tablet (500 mg total) by mouth every 12 (twelve) hours.   ascorbic acid 500 MG tablet Commonly known as: VITAMIN C Take 1 tablet (500 mg total) by mouth daily.   aspirin 81 MG chewable tablet Chew 81 mg by mouth daily.   b complex vitamins capsule Take 1 capsule by mouth daily.   docusate sodium 100 MG capsule Commonly known as: COLACE Take 1 capsule (100 mg total) by mouth 2 (two) times daily. Stool softner   enoxaparin 40 MG/0.4ML injection Commonly known as: LOVENOX Inject 0.4 mLs (40 mg total) into the skin daily.   ergocalciferol 1.25 MG (50000 UT) capsule Commonly known as: VITAMIN D2 Take 1 capsule (50,000 Units total) by mouth every Monday. Start taking on: Mar 08, 2020   gabapentin 300 MG capsule Commonly known as: NEURONTIN Take 300 mg by mouth at bedtime.   HYDROcodone-acetaminophen 5-325 MG tablet Rx# 30 pills to Commonly known as: NORCO/VICODIN Take 1-2 tablets by mouth every 6 (six) hours as needed for severe pain.   iron  polysaccharides 150 MG capsule Commonly known as: NIFEREX Take 1 capsule (150 mg total) by mouth daily.   lisinopril 20 MG tablet Commonly known as: ZESTRIL Take 20 mg by mouth at bedtime.   metFORMIN 500 MG 24 hr tablet Commonly known as: GLUCOPHAGE-XR Take 2 tablets (1,000 mg total) by mouth in the morning and at bedtime.   methocarbamol 500 MG tablet Commonly known as: ROBAXIN Take 1 tablet (500 mg total) by mouth every 6 (six) hours as needed for muscle spasms.   multivitamin with minerals Tabs tablet Take 1 tablet by mouth daily.   pantoprazole 20 MG tablet Commonly known as: PROTONIX Take 20 mg by mouth daily.   polyethylene glycol 17 g packet Commonly known as: MIRALAX / GLYCOLAX Take 17 g by mouth daily.   rosuvastatin 10 MG tablet Commonly known as: CRESTOR Take 10 mg  by mouth at bedtime.   traMADol 50 MG tablet--Rx # 120 pills Commonly known as: ULTRAM Take 1-2 tablets (50-100 mg total) by mouth every 6 (six) hours as needed for moderate pain. for pain What changed:   how much to take  when to take this  reasons to take this   Trulicity 1.5 MG/0.5ML Sopn Generic drug: Dulaglutide Inject 1.5 mg into the skin every Sunday.   Vitamin D3 25 MCG tablet Commonly known as: Vitamin D Take 2 tablets (2,000 Units total) by mouth 2 (two) times daily.      Follow-up Information    Myrene Galas, MD. Call.   Specialty: Orthopedic Surgery Why: for post op check.  Contact information: [address] [place] [address] [phone]        Laurel Dimmer, FNP. Call.   Specialty: General Practice Why: for post hospital follow up Contact information: [address] [place] [address] [phone]        Erick Colace, MD Follow up.   Specialty: Physical Medicine and Rehabilitation Why: as needed Contact information: [address] [place] [address] [phone]           Signed: Jacquelynn Cree 03/05/2020, 9:20 AM

## 2020-03-05 NOTE — Progress Notes (Signed)
[facility] PHYSICAL MEDICINE & REHABILITATION PROGRESS NOTE   Subjective/Complaints:  The patient mistakenly identified me as his Careers adviser.  We discussed that Ortho will be coming by to remove wound VAC, remove sutures and discuss  follow-up plans  ROS: Patient denies nausea vomiting diarrhea constipation Objective:   No results found. No results for input(s): WBC, HGB, HCT, PLT in the last 72 hours. No results for input(s): NA, K, CL, CO2, GLUCOSE, BUN, CREATININE, CALCIUM in the last 72 hours.  Intake/Output Summary (Last 24 hours) at 03/05/2020 0925 Last data filed at 03/05/2020 0855 Gross per 24 hour  Intake 948 ml  Output 2375 ml  Net -1427 ml     Physical Exam: Vital Signs Blood pressure (!) 154/79, pulse 79, temperature 98 F (36.7 C), temperature source Oral, resp. rate 16, height 5\' 11"  (1.803 m), weight 123 kg, SpO2 97 %.  General: No acute distress Mood and affect are appropriate Heart: Regular rate and rhythm no rubs murmurs or extra sounds Lungs: Clear to auscultation, breathing unlabored, no rales or wheezes Abdomen: Positive bowel sounds, soft nontender to palpation, nondistended Extremities: No clubbing, cyanosis, or edema Skin: No evidence of breakdown, no evidence of rash  Musculoskeletal: Left knee in ext , Left heel cord tight , Right rib pai with Right shoulder abduction  Limited range of motion in left ankle left knee in knee orthosis  Extremities: No clubbing, cyanosis, or edema Skin: Right ant shin sutures CDI, LLE ACE wrapped with wound vac   Neurologic: Cranial nerves II through XII intact, motor strength is 5/5 in bilateral deltoid, bicep, tricep, grip,R hip flexor, knee extensors, ankle dorsiflexor and plantar flexor Left LE with wound vac ace wrap, 4- Knee ex , limited ankle DF/PF      Assessment/Plan: 1. Functional deficits secondary to polytrauma  Stable for D/C today F/u PCP in 3-4 weeks F/u PM&R not needed F/u Ortho Trauma See D/C  summary See D/C instructions Care Tool:  Bathing    Body parts bathed by patient: Right arm, Left arm, Chest, Abdomen, Front perineal area, Right upper leg, Left upper leg, Face, Buttocks, Right lower leg   Body parts bathed by helper: Buttocks Body parts n/a: Left lower leg   Bathing assist Assist Level: Independent with assistive device(per pt report)     Upper Body Dressing/Undressing Upper body dressing   What is the patient wearing?: Pull over shirt    Upper body assist Assist Level: Independent(per pt report)    Lower Body Dressing/Undressing Lower body dressing      What is the patient wearing?: Underwear/pull up, Pants     Lower body assist Assist for lower body dressing: Independent with assitive device(per pt report)     Toileting Toileting Toileting Activity did not occur (Clothing management and hygiene only): N/A (no void or bm)  Toileting assist Assist for toileting: Independent with assistive device     Transfers Chair/bed transfer  Transfers assist     Chair/bed transfer assist level: Independent with assistive device Chair/bed transfer assistive device:   Ambulation assist      Assist level: Independent with assistive device Assistive device: Walker-rolling Max distance: 65   Walk 10 feet activity   Assist     Assist level: Independent with assistive device Assistive device: Walker-rolling   Walk 50 feet activity   Assist Walk 50 feet with 2 turns activity did not occur: Safety/medical concerns  Assist level: Independent with assistive device Assistive device: Walker-rolling  Walk 150 feet activity   Assist Walk 150 feet activity did not occur: Safety/medical concerns  Assist level: Independent with assistive device Assistive device: Walker-rolling    Walk 10 feet on uneven surface  activity   Assist Walk 10 feet on uneven surfaces activity did not occur: Safety/medical  concerns   Assist level: Supervision/Verbal cueing     Wheelchair     Assist Will patient use wheelchair at discharge?: Yes Type of Wheelchair: Manual    Wheelchair assist level: Independent Max wheelchair distance: 250    Wheelchair 50 feet with 2 turns activity    Assist        Assist Level: Independent   Wheelchair 150 feet activity     Assist      Assist Level: Independent   Blood pressure (!) 154/79, pulse 79, temperature 98 F (36.7 C), temperature source Oral, resp. rate 16, height 5\' 11"  (1.803 m), weight 123 kg, SpO2 97 %.  Medical Problem List and Plan: 1.  Impaired function, ADLs, and mobility secondary to polytrauma- falling from 15 ft ladder- L tibial plateau fx- s/pp ex-fix then closed reduction- is NWB on LLE- laceration RLE- WBAT; and s/p chest tube for tension pneumothorax.   wound VAC for LLE for now.              -patient may  Shower once VAC is removed, Ortho follow-up prior to discharge     Discharge today   --Continue CIR therapies including PT, OT  2.  Antithrombotics: -DVT/anticoagulation:  Pharmaceutical: Lovenox             -antiplatelet therapy: N/a 3. Pain Management: Vicodin prn effective.- alternating with muscle relaxant- will continue   Discharged on hydrocodone 5 mg 1 to 2 tablets 4 times daily as needed 4. Mood: LCSW to follow for evaluation and support.              -antipsychotic agents: N/A 5. Neuropsych: This patient is capable of making decisions on his own behalf. 6. Skin/Wound Care: Keep wound clean and dry--monitor for healing.   -Wound VAC to DC today, sutures right leg to be DC'd today by Ortho 7. Fluids/Electrolytes/Nutrition: Monitor I/O. Appetite has been good. Check lytes in am.. 8. T2DM: Will continue to monitor BS ac/hs and continue SSI. Resume metformin --family to bring in Trulicity--weekly on Sun. Will resume amaryl as indicated. CBG (last 3)  Recent Labs    03/04/20 1711 03/04/20 2047  03/05/20 0607  GLUCAP 125* 153* 139*  controlled 4/30, follow-up with PCP 9. HTN: Monitor BP tid--continue Lisinopril daily. Titrate for better control  Vitals:   03/04/20 1952 03/05/20 0515  BP: 139/77 (!) 154/79  Pulse: 79 79  Resp: 18 16  Temp: 98.2 F (36.8 C) 98 F (36.7 C)  SpO2: 99% 97%  good control on Zestril, mildly elevated this morning prior to medications 10. Leukocytosis: wbc's down to 8.7 4/26  11. ABLA: Stable. Add iron supplement. hgb up to 8.8 4/26 12. Right PTX: Resolved. Continue IS. CT has been removed   13.  Ortho f/u   LOS: 8 days A FACE TO FACE EVALUATION WAS PERFORMED  Charlett Blake 03/05/2020, 9:25 AM

## 2020-03-05 NOTE — Discharge Instructions (Signed)
Inpatient Rehab Discharge Instructions  Cameron Velasquez Discharge date and time: 03/05/20   Activities/Precautions/ Functional Status: Activity: no lifting, driving, or strenuous exercise till cleared by MD. NO WEIGHT on LEFT LEG.  Diet: diabetic diet Wound Care: keep wound clean and dry.  Contact ortho if you develop any problems with your incision/wound--redness, swelling, increase in pain, drainage or if you develop fever or chills.    Functional status:  ___ No restrictions     ___ Walk up steps independently _X__ 24/7 supervision/assistance   ___ Walk up steps with assistance ___ Intermittent supervision/assistance  ___ Bathe/dress independently ___ Walk with walker     ___ Bathe/dress with assistance ___ Walk Independently    ___ Shower independently ___ Walk with assistance    _X__ Shower with assistance _X__ No alcohol     ___ Return to work/school ________  Special Instructions: 1. Wear brace on left leg at all times.   COMMUNITY REFERRALS UPON DISCHARGE:  Home Health: PT ,  OT  Agency Phone:[facility]  [phone]      My questions have been answered and I understand these instructions. I will adhere to these goals and the provided educational materials after my discharge from the hospital.  Patient/Caregiver Signature _______________________________ Date __________  Clinician Signature _______________________________________ Date __________  Please bring this form and your medication list with you to all your follow-up doctor's appointments.

## 2020-03-10 ENCOUNTER — Other Ambulatory Visit: Payer: Self-pay | Admitting: Physician Assistant

## 2020-03-10 ENCOUNTER — Ambulatory Visit
Admission: RE | Admit: 2020-03-10 | Discharge: 2020-03-10 | Disposition: A | Discharge status: Home / Self Care | Payer: Medicare Other | Source: Ambulatory Visit | Attending: Physician Assistant | Admitting: Physician Assistant

## 2020-03-10 DIAGNOSIS — J939 Pneumothorax, unspecified: Secondary | ICD-10-CM

## 2020-03-14 NOTE — Op Note (Signed)
8:32 PM 03/14/2020 02/23/2020  PATIENT:  Cameron Velasquez  64 y.o. male  [id number]  PRE-OPERATIVE DIAGNOSIS:   1. LEFT BICONDYLAR TIBIAL PLATEAU FRACTURE WITH SHAFT EXTENSION  2. RETAINED EXTERNAL FIXATOR  POST-OPERATIVE DIAGNOSIS:  1. LEFT BICONDYLAR TIBIAL PLATEAU FRACTURE WITH SHAFT EXTENSION  2. RETAINED EXTERNAL FIXATOR  PROCEDURE:   1. ORIF OF LEFT BICONDYLAR TIBIAL PLATEAU FRACTURE  2. ORIF TIBIAL EMINENCE 3. REPAIR OF TIBIAL SHAFT 4. REMOVAL OF EXTERNAL FIXATOR UNDER ANESTHESIA  SURGEON:  Surgeon(s) and Role:    Myrene Galas, MD - Primary  PHYSICIAN ASSISTANT: Montez Morita, PA-C  ANESTHESIA:   general  I/O:  No intake/output data recorded.  SPECIMEN:  None  TOURNIQUET:  * Missing tourniquet times found for documented tourniquets in log: [id number] *  DICTATION: .Note written in EPIC  DISPOSITION: PACU  CONDITION: STABLE     BRIEF SUMMARY AND INDICATION FOR PROCEDURE:  Patient is a 64 y.o.-year- old with a tibial plateau fracture, treated provisionally with aggressive ice, elevation, and active motion of the foot and toes to facilitate resolution of soft tissue swelling.  We did discuss with the patient the risks and benefits of surgical treatment including the potential for arthritis, nerve injury, vessel injury, loss of motion, DVT, PE, heart attack, stroke, symptomatic hardware, need for further surgery, and multiple others.  The patient acknowledged these risks and wished to proceed.   BRIEF SUMMARY OF PROCEDURE:  After administration of preoperative antibiotics, the patient was taken to the operating room.  General anesthesia was induced. The lower extremity prepped and draped in usual sterile fashion using a chlorhexidine wash and betadine scrub and paint. A timeout was performed.  We did retain the fixator to protect the neurovascular structures during prepping. Once time-out was held, the leg was isolated from the clamps with towels and the fixator removed.   The pins were not ulcerated and did not require debridement. These were irrigated thoroughly. Additional Betadine paint was performed, Ioban used to isolate them from the surgical field, and new gloves obtained by operative staff.  I then brought in the radiolucent triangle.  A curvilinear incision was made extending laterally over Gerdy's tubercle. Dissection was carried down where the soft tissues were left intact to the lateral plateau and rim.  I did incise the retinaculum proximal to the tibial plateau, and then going along inside the retinaculum performed a submeniscal Arthrotomy, releasing the coronary ligament along its insertion onto the tibia. Zero prolene suture was used to reflect this and inspect the meniscus and joint surface. The joint was irrigated thoroughly and lateral meniscus was intact. The joint surface was markedly depressed.  We then released some of the anterior extensors to enable the plate to fit along the proximal shaft. A trapdoor was made in the lateral tibial metaphysis.  Through this, I introduced a series of tamps and with my assistant pulling traction, I was able to elevate the articular surface in sequential fashion while watching it through the arthrotomy.  I was able to mobilize the eminence segment into reduction through the arthrotomy and secure it between the lateral and medial portions of the plateau. Once I had restored appropriate Height to the lateral plateau, the bone defect was grafted with cancellous chips. The defect in the metaphysis was filled with cancellous bone and tamped into place. I then placed the plate laterally and used the [employer] clamp with the foot because of compromised bone strength to apply a compressive force across the joint line. This  reduced the widened plateau back to the appropriate size.  We then turned our attention to the shaft where there were coronal splits extending down below the tubercle and were able to place lag screw  fixation with two screws. At this point, we placed standard fixation in the proximal row of the plate followed by locked fixation.  This was followed by additional fixation within the shaft. My assistant was careful to control alignment throughout by using traction and bending forces. He also assited with retraction. All wounds were irrigated thoroughly. Final AP and LAT fluoro images showed restoration of alignment, reduction, and varus/ valgus stability on stress view.   Once more, wound was irrigated and then a standard layered closure performed, 0 Vicryl, 2-0 Vicryl, and 3-0 nylon for the skin.  Sterile gently compressive dressing was applied and in knee immobilizer.  The patient was taken to the PACU in stable Condition. Cameron Velasquez, [facility], was present and participating throughout being necessary to the case.   PROGNOSIS: The patient will be transitioned into a hinged knee brace with range of motion and this will begin immediately, but will be restricted from any active extension until 6 weeks.  Orders entered for nonweightbearing on the operative extremity, pharmacologic DVT prophylaxis, and mobilization with PT and OT. After discharge, we will plan to see the patient back in about 2 weeks for removal of sutures and we will continue to follow throughout the hospital stay.         Cameron Divine. Marcelino Scot, M.D.

## 2020-03-26 ENCOUNTER — Other Ambulatory Visit: Payer: Self-pay | Admitting: Physical Medicine and Rehabilitation

## 2020-03-27 ENCOUNTER — Other Ambulatory Visit: Payer: Self-pay | Admitting: Physical Medicine and Rehabilitation

## 2020-11-15 IMAGING — DX DG CHEST 2V
2 series · 2 of 2 positions shown · non-contrast
Comparison: 02/23/2020, CT 02/17/2020

CLINICAL DATA: History of pneumothorax

EXAM:
CHEST - 2 VIEW

[view not recorded]
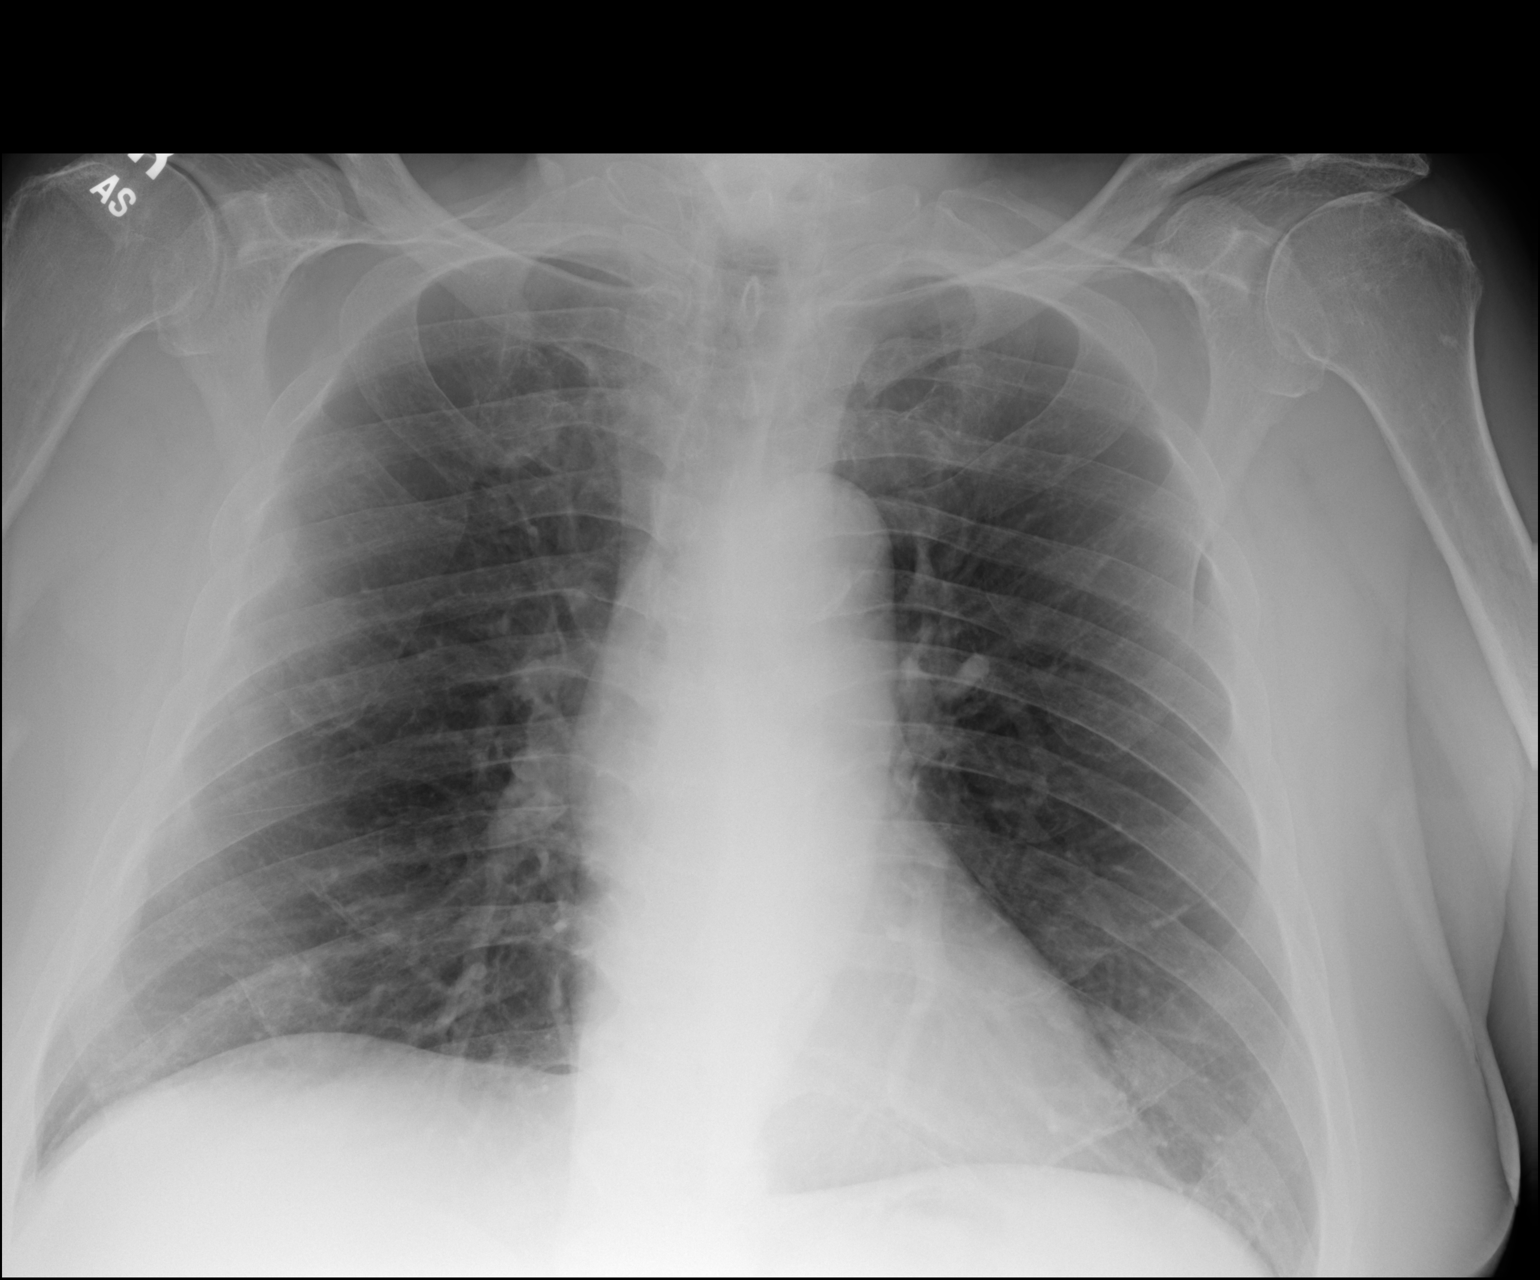

[dg chest 2 view]
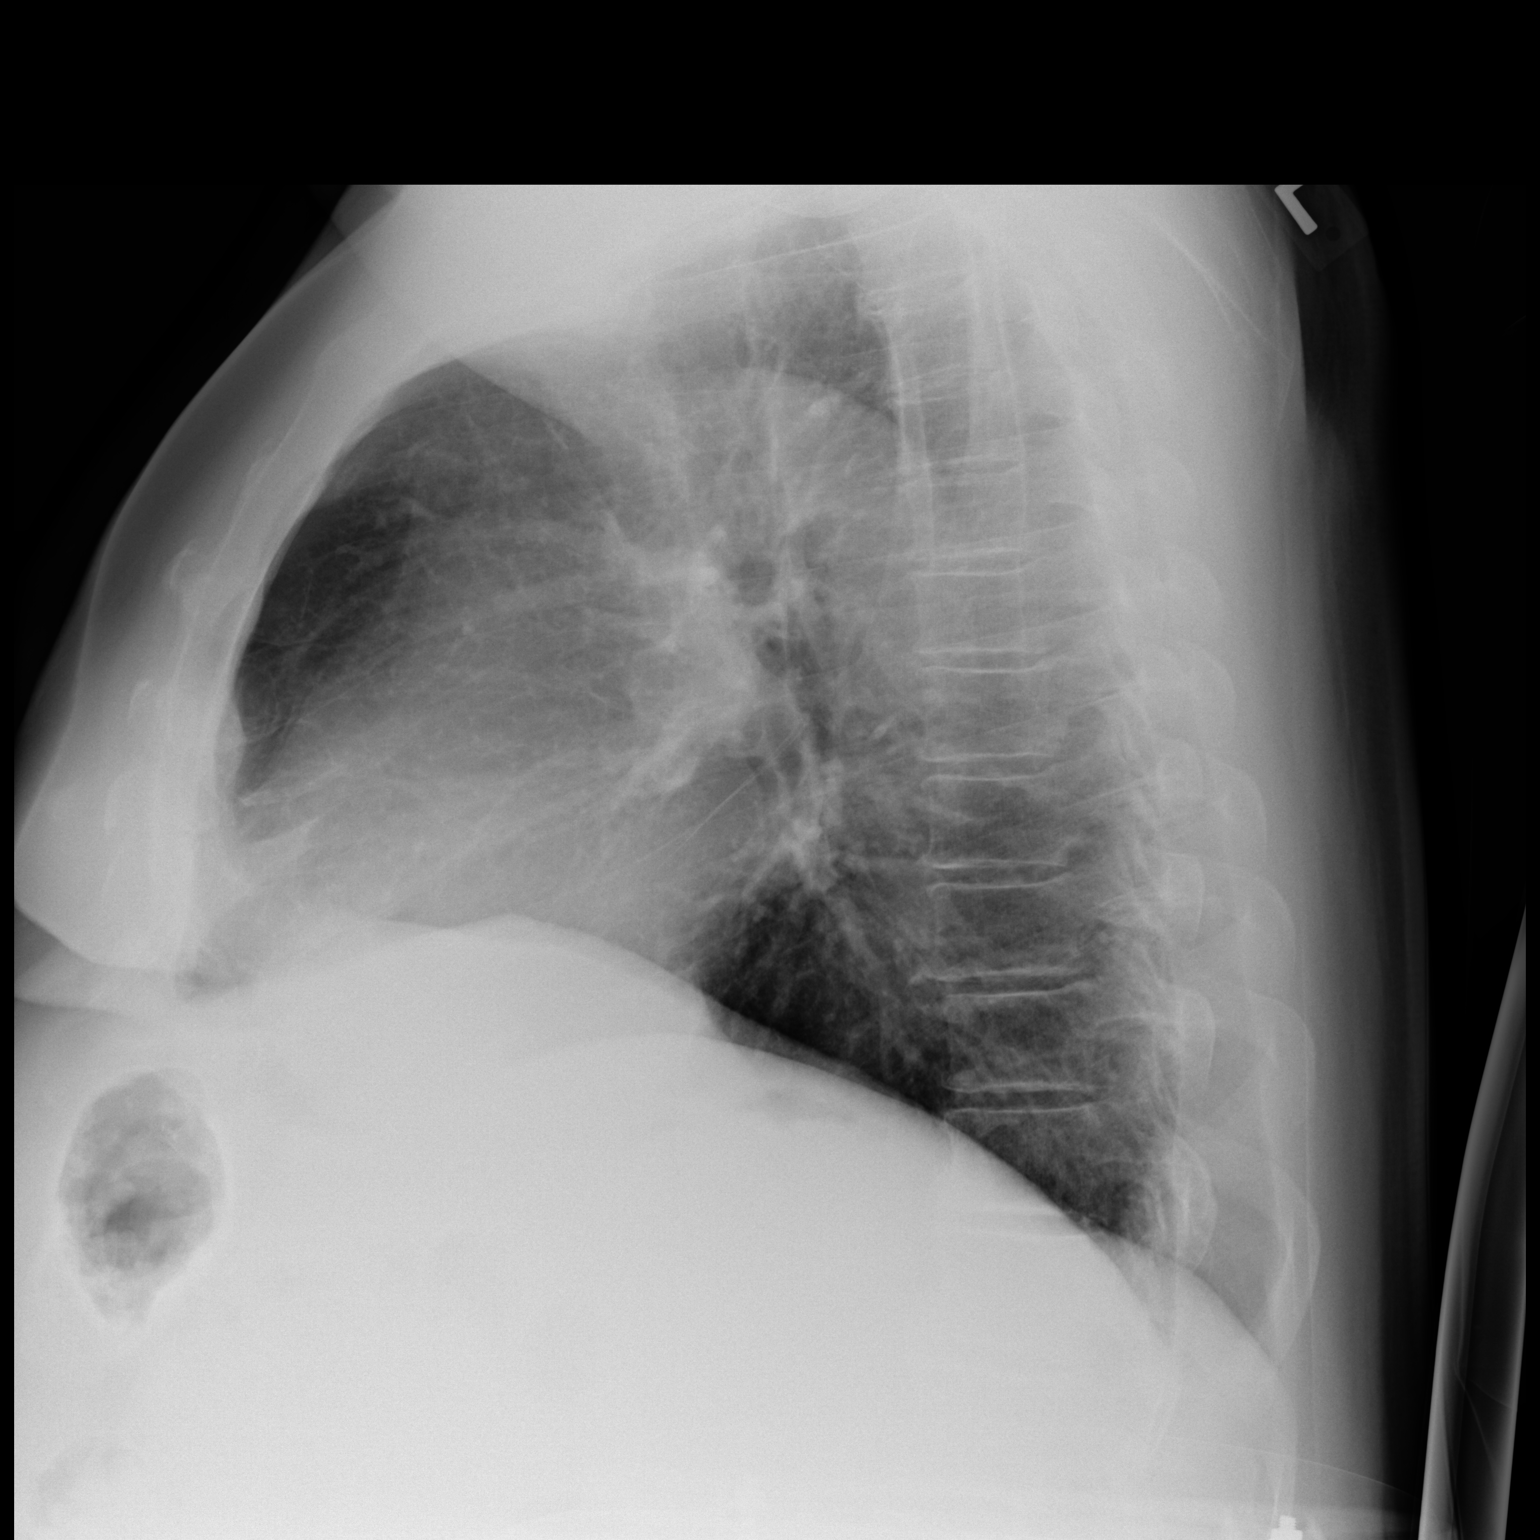

[2 of 2 positions shown; findings below may reference images not displayed]

FINDINGS: The heart size and mediastinal contours are within normal limits.
Both lungs are clear. The visualized skeletal structures are
unremarkable. Aortic atherosclerosis.
IMPRESSION: No active cardiopulmonary disease.

## 2023-07-14 LAB — COLOGUARD: COLOGUARD: NEGATIVE
